# Patient Record
Sex: Female | Born: 1935 | Race: White | Hispanic: No | Marital: Married | Smoking: Never smoker
Health system: Southern US, Community
[De-identification: ages and names within clinical notes are randomized; demographics above are authoritative.]

## PROBLEM LIST (undated history)

## (undated) DIAGNOSIS — C801 Malignant (primary) neoplasm, unspecified: Secondary | ICD-10-CM

## (undated) DIAGNOSIS — I10 Essential (primary) hypertension: Secondary | ICD-10-CM

## (undated) DIAGNOSIS — A809 Acute poliomyelitis, unspecified: Secondary | ICD-10-CM

## (undated) DIAGNOSIS — R42 Dizziness and giddiness: Secondary | ICD-10-CM

## (undated) DIAGNOSIS — T7840XA Allergy, unspecified, initial encounter: Secondary | ICD-10-CM

## (undated) DIAGNOSIS — H409 Unspecified glaucoma: Secondary | ICD-10-CM

## (undated) DIAGNOSIS — K5790 Diverticulosis of intestine, part unspecified, without perforation or abscess without bleeding: Secondary | ICD-10-CM

## (undated) HISTORY — DX: Acute poliomyelitis, unspecified: A80.9

## (undated) HISTORY — PX: BREAST SURGERY: SHX581

## (undated) HISTORY — DX: Unspecified glaucoma: H40.9

## (undated) HISTORY — PX: DILATION AND CURETTAGE OF UTERUS: SHX78

## (undated) HISTORY — DX: Allergy, unspecified, initial encounter: T78.40XA

## (undated) HISTORY — DX: Dizziness and giddiness: R42

## (undated) HISTORY — PX: OTHER SURGICAL HISTORY: SHX169

## (undated) HISTORY — DX: Essential (primary) hypertension: I10

## (undated) HISTORY — DX: Malignant (primary) neoplasm, unspecified: C80.1

## (undated) HISTORY — PX: LUMBAR LAMINECTOMY: SHX95

## (undated) HISTORY — DX: Diverticulosis of intestine, part unspecified, without perforation or abscess without bleeding: K57.90

---

## 1999-04-22 ENCOUNTER — Ambulatory Visit (HOSPITAL_COMMUNITY): Admission: RE | Admit: 1999-04-22 | Discharge: 1999-04-22 | Payer: Self-pay | Admitting: *Deleted

## 1999-04-23 ENCOUNTER — Ambulatory Visit (HOSPITAL_COMMUNITY): Admission: RE | Admit: 1999-04-23 | Discharge: 1999-04-23 | Payer: Self-pay | Admitting: *Deleted

## 1999-04-23 ENCOUNTER — Encounter: Payer: Self-pay | Admitting: *Deleted

## 2002-01-30 ENCOUNTER — Ambulatory Visit (HOSPITAL_BASED_OUTPATIENT_CLINIC_OR_DEPARTMENT_OTHER): Admission: RE | Admit: 2002-01-30 | Discharge: 2002-01-30 | Payer: Self-pay | Admitting: Orthopedic Surgery

## 2002-01-30 ENCOUNTER — Encounter (INDEPENDENT_AMBULATORY_CARE_PROVIDER_SITE_OTHER): Payer: Self-pay | Admitting: *Deleted

## 2004-09-13 ENCOUNTER — Encounter: Admission: RE | Admit: 2004-09-13 | Discharge: 2004-09-13 | Payer: Self-pay | Admitting: Family Medicine

## 2004-09-13 ENCOUNTER — Ambulatory Visit: Payer: Self-pay | Admitting: Family Medicine

## 2005-04-08 ENCOUNTER — Ambulatory Visit: Payer: Self-pay | Admitting: Family Medicine

## 2005-10-05 ENCOUNTER — Ambulatory Visit: Payer: Self-pay | Admitting: Family Medicine

## 2005-10-14 ENCOUNTER — Ambulatory Visit: Payer: Self-pay | Admitting: Family Medicine

## 2006-04-27 ENCOUNTER — Ambulatory Visit: Payer: Self-pay | Admitting: Family Medicine

## 2006-10-13 DIAGNOSIS — K5730 Diverticulosis of large intestine without perforation or abscess without bleeding: Secondary | ICD-10-CM

## 2006-10-13 DIAGNOSIS — T7840XA Allergy, unspecified, initial encounter: Secondary | ICD-10-CM | POA: Insufficient documentation

## 2006-10-13 DIAGNOSIS — M810 Age-related osteoporosis without current pathological fracture: Secondary | ICD-10-CM | POA: Insufficient documentation

## 2006-10-13 DIAGNOSIS — I10 Essential (primary) hypertension: Secondary | ICD-10-CM | POA: Insufficient documentation

## 2006-10-13 DIAGNOSIS — R42 Dizziness and giddiness: Secondary | ICD-10-CM

## 2006-10-13 HISTORY — DX: Essential (primary) hypertension: I10

## 2006-10-13 HISTORY — DX: Dizziness and giddiness: R42

## 2006-10-13 HISTORY — DX: Age-related osteoporosis without current pathological fracture: M81.0

## 2006-10-13 HISTORY — DX: Diverticulosis of large intestine without perforation or abscess without bleeding: K57.30

## 2006-10-17 ENCOUNTER — Ambulatory Visit: Payer: Self-pay | Admitting: Family Medicine

## 2006-10-17 LAB — CONVERTED CEMR LAB
Alkaline Phosphatase: 37 units/L — ABNORMAL LOW (ref 39–117)
Basophils Relative: 0.1 % (ref 0.0–1.0)
CO2: 28 meq/L (ref 19–32)
Cholesterol: 141 mg/dL (ref 0–200)
Creatinine, Ser: 0.7 mg/dL (ref 0.4–1.2)
Eosinophils Relative: 1.7 % (ref 0.0–5.0)
Glucose, Bld: 101 mg/dL — ABNORMAL HIGH (ref 70–99)
HCT: 36.1 % (ref 36.0–46.0)
Hemoglobin: 12.6 g/dL (ref 12.0–15.0)
LDL Cholesterol: 63 mg/dL (ref 0–99)
Monocytes Absolute: 0.5 10*3/uL (ref 0.2–0.7)
Neutrophils Relative %: 80.9 % — ABNORMAL HIGH (ref 43.0–77.0)
Potassium: 3.7 meq/L (ref 3.5–5.1)
RDW: 13 % (ref 11.5–14.6)
Sodium: 142 meq/L (ref 135–145)
TSH: 1.27 microintl units/mL (ref 0.35–5.50)
Total Bilirubin: 0.5 mg/dL (ref 0.3–1.2)
Total Protein: 6.2 g/dL (ref 6.0–8.3)
VLDL: 13 mg/dL (ref 0–40)

## 2007-04-06 ENCOUNTER — Encounter: Payer: Self-pay | Admitting: Family Medicine

## 2007-05-03 ENCOUNTER — Ambulatory Visit: Payer: Self-pay | Admitting: Family Medicine

## 2007-09-26 LAB — CONVERTED CEMR LAB: Pap Smear: NORMAL

## 2007-10-15 ENCOUNTER — Telehealth (INDEPENDENT_AMBULATORY_CARE_PROVIDER_SITE_OTHER): Payer: Self-pay | Admitting: *Deleted

## 2007-10-24 ENCOUNTER — Encounter: Payer: Self-pay | Admitting: Family Medicine

## 2007-11-20 ENCOUNTER — Ambulatory Visit: Payer: Self-pay | Admitting: Family Medicine

## 2007-11-20 DIAGNOSIS — M4120 Other idiopathic scoliosis, site unspecified: Secondary | ICD-10-CM

## 2007-11-20 DIAGNOSIS — Z8612 Personal history of poliomyelitis: Secondary | ICD-10-CM

## 2007-11-20 HISTORY — DX: Personal history of poliomyelitis: Z86.12

## 2007-11-20 HISTORY — DX: Other idiopathic scoliosis, site unspecified: M41.20

## 2007-11-22 ENCOUNTER — Encounter (INDEPENDENT_AMBULATORY_CARE_PROVIDER_SITE_OTHER): Payer: Self-pay | Admitting: *Deleted

## 2007-11-26 ENCOUNTER — Encounter: Payer: Self-pay | Admitting: Family Medicine

## 2007-12-06 ENCOUNTER — Encounter: Payer: Self-pay | Admitting: Family Medicine

## 2007-12-13 ENCOUNTER — Encounter: Payer: Self-pay | Admitting: Family Medicine

## 2008-01-09 ENCOUNTER — Encounter: Admission: RE | Admit: 2008-01-09 | Discharge: 2008-01-09 | Payer: Self-pay | Admitting: Orthopedic Surgery

## 2008-01-22 ENCOUNTER — Encounter: Admission: RE | Admit: 2008-01-22 | Discharge: 2008-01-22 | Payer: Self-pay | Admitting: Orthopedic Surgery

## 2008-02-19 ENCOUNTER — Ambulatory Visit (HOSPITAL_COMMUNITY): Admission: RE | Admit: 2008-02-19 | Discharge: 2008-02-20 | Payer: Self-pay | Admitting: Neurosurgery

## 2008-02-19 ENCOUNTER — Encounter: Payer: Self-pay | Admitting: Family Medicine

## 2008-03-07 ENCOUNTER — Ambulatory Visit: Payer: Self-pay | Admitting: Family Medicine

## 2008-03-07 DIAGNOSIS — Z853 Personal history of malignant neoplasm of breast: Secondary | ICD-10-CM

## 2008-03-07 HISTORY — DX: Personal history of malignant neoplasm of breast: Z85.3

## 2008-03-10 LAB — CONVERTED CEMR LAB
ALT: 12 units/L (ref 0–35)
AST: 18 units/L (ref 0–37)
Albumin: 3.3 g/dL — ABNORMAL LOW (ref 3.5–5.2)
BUN: 7 mg/dL (ref 6–23)
Basophils Relative: 0.7 % (ref 0.0–3.0)
CO2: 27 meq/L (ref 19–32)
Chloride: 109 meq/L (ref 96–112)
Creatinine, Ser: 0.6 mg/dL (ref 0.4–1.2)
Eosinophils Absolute: 0 10*3/uL (ref 0.0–0.7)
Eosinophils Relative: 1.2 % (ref 0.0–5.0)
GFR calc non Af Amer: 103 mL/min
MCV: 93.1 fL (ref 78.0–100.0)
Neutrophils Relative %: 71.9 % (ref 43.0–77.0)
RBC: 3.64 M/uL — ABNORMAL LOW (ref 3.87–5.11)
TSH: 0.63 microintl units/mL (ref 0.35–5.50)
Total Protein: 6.1 g/dL (ref 6.0–8.3)
VLDL: 17 mg/dL (ref 0–40)
WBC: 3.9 10*3/uL — ABNORMAL LOW (ref 4.5–10.5)

## 2008-03-11 ENCOUNTER — Telehealth (INDEPENDENT_AMBULATORY_CARE_PROVIDER_SITE_OTHER): Payer: Self-pay | Admitting: *Deleted

## 2008-03-12 ENCOUNTER — Encounter (INDEPENDENT_AMBULATORY_CARE_PROVIDER_SITE_OTHER): Payer: Self-pay | Admitting: *Deleted

## 2008-03-19 ENCOUNTER — Encounter: Admission: RE | Admit: 2008-03-19 | Discharge: 2008-04-17 | Payer: Self-pay | Admitting: Neurosurgery

## 2008-06-11 ENCOUNTER — Ambulatory Visit: Payer: Self-pay | Admitting: Family Medicine

## 2008-06-18 ENCOUNTER — Encounter: Payer: Self-pay | Admitting: Family Medicine

## 2009-01-13 ENCOUNTER — Encounter: Payer: Self-pay | Admitting: Family Medicine

## 2009-01-22 ENCOUNTER — Encounter: Payer: Self-pay | Admitting: Family Medicine

## 2009-01-27 ENCOUNTER — Encounter: Payer: Self-pay | Admitting: Family Medicine

## 2009-03-11 ENCOUNTER — Encounter: Payer: Self-pay | Admitting: Family Medicine

## 2009-03-11 ENCOUNTER — Ambulatory Visit: Payer: Self-pay | Admitting: Family Medicine

## 2009-03-11 LAB — CONVERTED CEMR LAB: OCCULT 2: NEGATIVE

## 2009-03-17 ENCOUNTER — Telehealth: Payer: Self-pay | Admitting: Family Medicine

## 2009-07-01 ENCOUNTER — Ambulatory Visit: Payer: Self-pay | Admitting: Family Medicine

## 2009-08-25 ENCOUNTER — Encounter: Payer: Self-pay | Admitting: Family Medicine

## 2010-02-24 ENCOUNTER — Encounter: Payer: Self-pay | Admitting: Family Medicine

## 2010-03-09 ENCOUNTER — Telehealth (INDEPENDENT_AMBULATORY_CARE_PROVIDER_SITE_OTHER): Payer: Self-pay | Admitting: *Deleted

## 2010-03-16 ENCOUNTER — Encounter: Payer: Self-pay | Admitting: Family Medicine

## 2010-03-16 ENCOUNTER — Ambulatory Visit: Payer: Self-pay | Admitting: Family Medicine

## 2010-03-17 ENCOUNTER — Encounter: Payer: Self-pay | Admitting: Family Medicine

## 2010-03-18 LAB — CONVERTED CEMR LAB
ALT: 20 units/L (ref 0–35)
AST: 27 units/L (ref 0–37)
Alkaline Phosphatase: 66 units/L (ref 39–117)
BUN: 15 mg/dL (ref 6–23)
Basophils Absolute: 0 10*3/uL (ref 0.0–0.1)
Bilirubin, Direct: 0.1 mg/dL (ref 0.0–0.3)
Calcium: 8.8 mg/dL (ref 8.4–10.5)
Cholesterol: 156 mg/dL (ref 0–200)
Creatinine, Ser: 0.7 mg/dL (ref 0.4–1.2)
Eosinophils Relative: 2.4 % (ref 0.0–5.0)
GFR calc non Af Amer: 90.45 mL/min (ref >60)
Glucose, Bld: 96 mg/dL (ref 70–99)
HCT: 36.2 % (ref 36.0–46.0)
HDL: 61.2 mg/dL (ref >39.00)
LDL Cholesterol: 73 mg/dL (ref 0–99)
Lymphocytes Relative: 17.9 % (ref 12.0–46.0)
Monocytes Relative: 9.2 % (ref 3.0–12.0)
Neutrophils Relative %: 69.8 % (ref 43.0–77.0)
Platelets: 207 10*3/uL (ref 150.0–400.0)
Potassium: 4 meq/L (ref 3.5–5.1)
RDW: 14.5 % (ref 11.5–14.6)
Total Bilirubin: 0.5 mg/dL (ref 0.3–1.2)
VLDL: 21.6 mg/dL (ref 0.0–40.0)
Vit D, 25-Hydroxy: 54 ng/mL (ref 30–89)
WBC: 4.9 10*3/uL (ref 4.5–10.5)

## 2010-03-25 ENCOUNTER — Ambulatory Visit: Payer: Self-pay | Admitting: Family Medicine

## 2010-04-27 ENCOUNTER — Encounter: Payer: Self-pay | Admitting: Family Medicine

## 2010-04-28 ENCOUNTER — Encounter (INDEPENDENT_AMBULATORY_CARE_PROVIDER_SITE_OTHER): Payer: Self-pay | Admitting: *Deleted

## 2010-06-15 ENCOUNTER — Ambulatory Visit
Admission: RE | Admit: 2010-06-15 | Discharge: 2010-06-15 | Payer: Self-pay | Source: Home / Self Care | Attending: Family Medicine | Admitting: Family Medicine

## 2010-06-15 DIAGNOSIS — M549 Dorsalgia, unspecified: Secondary | ICD-10-CM

## 2010-06-15 HISTORY — DX: Dorsalgia, unspecified: M54.9

## 2010-06-22 ENCOUNTER — Telehealth (INDEPENDENT_AMBULATORY_CARE_PROVIDER_SITE_OTHER): Payer: Self-pay | Admitting: *Deleted

## 2010-07-05 ENCOUNTER — Encounter: Payer: Self-pay | Admitting: Orthopedic Surgery

## 2010-07-11 LAB — CONVERTED CEMR LAB
ALT: 16 units/L (ref 0–35)
Albumin: 3.5 g/dL (ref 3.5–5.2)
Alkaline Phosphatase: 39 units/L (ref 39–117)
Basophils Relative: 0.7 % (ref 0.0–3.0)
CO2: 30 meq/L (ref 19–32)
Chloride: 106 meq/L (ref 96–112)
Eosinophils Absolute: 0.1 10*3/uL (ref 0.0–0.7)
Eosinophils Relative: 2.6 % (ref 0.0–5.0)
Hemoglobin: 12.5 g/dL (ref 12.0–15.0)
Lymphocytes Relative: 18.8 % (ref 12.0–46.0)
MCHC: 34 g/dL (ref 30.0–36.0)
MCV: 94 fL (ref 78.0–100.0)
Monocytes Absolute: 0.3 10*3/uL (ref 0.1–1.0)
Neutro Abs: 2.9 10*3/uL (ref 1.4–7.7)
RBC: 3.9 M/uL (ref 3.87–5.11)
Sodium: 140 meq/L (ref 135–145)
Total CHOL/HDL Ratio: 2
Total Protein: 6.4 g/dL (ref 6.0–8.3)

## 2010-07-12 ENCOUNTER — Telehealth: Payer: Self-pay | Admitting: Family Medicine

## 2010-07-13 NOTE — Progress Notes (Signed)
Summary: Osteoporosis Results  Phone Note Outgoing Call   Call placed by: [name] CMA Duncan Dull),  March 09, 2010 1:08 PM Call placed to: Patient Details for Reason: Osteoporosis  Summary of Call: spk with pt, gave her Bone density results, Pt says she was having problems with the Fosamax and she stopped taking it a year ago. Wants to discuss alternate med when she comes in next week for her CPX but she knows she does not want the prolia.  Adv pt I will document chart and we will see her at her visit next week, she agreed. call ended. Initial call taken by: [name] CMA Duncan Dull),  March 09, 2010 1:08 PM

## 2010-07-13 NOTE — Medication Information (Signed)
Summary: Clarification for Ativan/[facility]  Clarification for Ativan/[facility]   Imported By: Lanelle Bal 03/30/2010 15:04:38  _____________________________________________________________________  External Attachment:    Type:   Image     Comment:   External Document

## 2010-07-13 NOTE — Letter (Signed)
Summary: [facility]: Immunoassay Fecal Occult Blood (iFOB) Order Form  [facility] [place]  [address] [place], [place] [address]   Phone: [phone]  Fax: [phone]      [facility]: Immunoassay Fecal Occult Blood (iFOB) Order Form   March 16, 2010 MRN: [id number]   Cassidy Brown Jan 01, 1930   Physicican Name: Dr.Lowne  Diagnosis Code: V56.71      [name] CMA (AAMA)

## 2010-07-13 NOTE — Letter (Signed)
Summary: [facility]  [facility]emat[facility]cology   Imported By: Lanelle Bal 09/02/2009 10:44:00  _____________________________________________________________________  External Attachment:    Type:   Image     Comment:   External Document

## 2010-07-13 NOTE — Miscellaneous (Signed)
Summary: Immunization Entry   Immunization History:  Influenza Immunization History:    Influenza:  historical @ [facility] (04/27/2010)

## 2010-07-13 NOTE — Assessment & Plan Note (Signed)
Summary: yearly//ph   Vital Signs:  Patient profile:   77 year old female Height:      61.75 inches Weight:      127.2 pounds BMI:     23.54 Temp:     98.6 degrees F oral Pulse rate:   72 / minute BP sitting:   138 / 76  (right arm) Cuff size:   regular  Vitals Entered By: [name] CMA Duncan Dull) (March 16, 2010 8:39 AM) CC: cpx/fasting, wants to discuss prolia Nutritional Status BMI of 19 -24 = normal  Does patient need assistance? Functional Status Self care, Cook/clean, Shopping, Social activities Ambulation Normal Comments Pt is able to read and write and to all ADLS.    Vision Screening:      Vision Comments: optho q4h--Mac degen---+ glasses 40db HL: Left  Right  Audiometry Comment: hearing ok--  L ear decreased hearing     History of Present Illness: Pt here for cpe ---no complaints.   Pt has ? about osteoporosis and prolia.   Pt Sees Dr Cliffton Asters for gyn and mammo.     Preventive Screening-Counseling & Management  Alcohol-Tobacco     Alcohol drinks/day: <1     Smoking Status: never     Passive Smoke Exposure: no  Caffeine-Diet-Exercise     Caffeine use/day: 0     Does Patient Exercise: yes     Type of exercise: walk     Times/week: <3  Hep-HIV-STD-Contraception     HIV Risk: no     Dental Visit-last 6 months yes     Dental Care Counseling: not indicated; dental care within six months     SBE monthly: yes     Sun Exposure-Excessive: no  Safety-Violence-Falls     Seat Belt Use: 100     Firearms in the Home: firearms in the home     Firearm Counseling: not indicated; uses recommended firearm safety measures     Smoke Detectors: yes     Smoke Detector Counseling: no     Violence in the Home: no risk noted     Violence Counseling: not indicated; no violence risk noted     Sexual Abuse: no     Sexual Abuse Counseling: no     Fall Risk: no      Sexual History:  currently monogamous.    Current Medications (verified): 1)  Atenolol 50 Mg  Tabs  (Atenolol) .Marland Kitchen.. 1 1/2 By Mouth Qd 2)  Terazosin Hcl 5 Mg  Caps (Terazosin Hcl) .Marland Kitchen.. 1 By Mouth Qd 3)  Claritin 10 Mg  Caps (Loratadine) .... Daily As Needed 4)  Sudafed 30 Mg  Tabs (Pseudoephedrine Hcl) .... As Needed 5)  Antivert 25 Mg  Tabs (Meclizine Hcl) .Marland Kitchen.. 1 By Mouth Three Times A Day As Needed 6)  Ativan 1 Mg Tabs (Lorazepam) .... Take 1 To 1/2 Tab As Needed 7)  Vitamin D 1000 Unit Tabs (Cholecalciferol) .... Take 1 Tab Once Daily 8)  Zostavax 16109 Unt/0.81ml Solr (Zoster Vaccine Live) .Marland Kitchen.. 1 Ml  Im X1  Allergies (verified): 1)  ! Darvocet 2)  ! Hydrocodone  Past History:  Past Medical History: Last updated: 03/07/2008 Diverticulosis, colon Hypertension Osteoporosis Dizziness or vertigo Current Problems:  POLIOMYELITIS, HX OF (ICD-V12.02) SCOLIOSIS (ICD-737.30) DIZZINESS OR VERTIGO (ICD-780.4) OSTEOPOROSIS (ICD-733.00) HYPERTENSION (ICD-401.9) DIVERTICULOSIS, COLON (ICD-562.10) Breast cancer, hx of  Family History: Last updated: 03/07/2008 Family History Hypertension P uncles---- cad  Social History: Last updated: 03/16/2010 Retired--pilot life Married Never Smoked Alcohol  use-no Drug use-n0 Regular exercise-yes  Risk Factors: Alcohol Use: <1 (03/16/2010) Caffeine Use: 0 (03/16/2010) Exercise: yes (03/16/2010)  Risk Factors: Smoking Status: never (03/16/2010) Passive Smoke Exposure: no (03/16/2010)  Past Surgical History: Lumpectomy Lumbar laminectomy 2009 d&C x 3 ganglion cyst r wrist US guided core biopsy L Breast--normal  Family History: Reviewed history from 03/07/2008 and no changes required. Family History Hypertension P uncles---- cad  Social History: Reviewed history from 03/07/2008 and no changes required. Retired--pilot life Married Never Smoked Alcohol use-no Drug use-n0 Regular exercise-yes Fall Risk:  no  Review of Systems      See HPI General:  Denies chills, fatigue, fever, loss of appetite, malaise, sleep disorder,  sweats, weakness, and weight loss. Eyes:  Complains of blurring; denies discharge, double vision, eye irritation, eye pain, halos, itching, light sensitivity, red eye, vision loss-1 eye, and vision loss-both eyes. ENT:  Complains of decreased hearing; denies difficulty swallowing, ear discharge, earache, hoarseness, nasal congestion, nosebleeds, postnasal drainage, ringing in ears, sinus pressure, and sore throat. CV:  Denies bluish discoloration of lips or nails, chest pain or discomfort, difficulty breathing at night, difficulty breathing while lying down, fainting, fatigue, leg cramps with exertion, lightheadness, near fainting, palpitations, shortness of breath with exertion, swelling of feet, swelling of hands, and weight gain. Resp:  Denies chest discomfort, chest pain with inspiration, cough, coughing up blood, excessive snoring, hypersomnolence, morning headaches, pleuritic, shortness of breath, sputum productive, and wheezing. GI:  Denies abdominal pain, bloody stools, change in bowel habits, constipation, dark tarry stools, diarrhea, excessive appetite, gas, hemorrhoids, indigestion, loss of appetite, nausea, vomiting, vomiting blood, and yellowish skin color. GU:  Denies abnormal vaginal bleeding, decreased libido, discharge, dysuria, genital sores, hematuria, incontinence, nocturia, urinary frequency, and urinary hesitancy. MS:  Complains of low back pain; denies joint pain, joint redness, joint swelling, loss of strength, mid back pain, muscle aches, muscle , cramps, muscle weakness, stiffness, and thoracic pain. Derm:  Denies changes in color of skin, changes in nail beds, dryness, excessive perspiration, flushing, hair loss, insect bite(s), itching, lesion(s), poor wound healing, and rash. Neuro:  Denies brief paralysis, difficulty with concentration, disturbances in coordination, falling down, headaches, inability to speak, memory loss, numbness, poor balance, seizures, sensation of room  spinning, tingling, tremors, visual disturbances, and weakness. Psych:  Denies alternate hallucination ( auditory/visual), anxiety, depression, easily angered, easily tearful, irritability, mental problems, panic attacks, sense of great danger, suicidal thoughts/plans, thoughts of violence, unusual visions or sounds, and thoughts /plans of harming others. Endo:  Denies cold intolerance, excessive hunger, excessive thirst, excessive urination, heat intolerance, polyuria, and weight change. Heme:  Denies abnormal bruising, bleeding, enlarge lymph nodes, fevers, pallor, and skin discoloration. Allergy:  Denies hives or rash, itching eyes, persistent infections, seasonal allergies, and sneezing.  Physical Exam  General:  Well-developed,well-nourished,in no acute distress; alert,appropriate and cooperative throughout examination Head:  Normocephalic and atraumatic without obvious abnormalities. No apparent alopecia or balding. Eyes:  pupils equal, pupils round, and pupils reactive to light.   Ears:  External ear exam shows no significant lesions or deformities.  Otoscopic examination reveals clear canals, tympanic membranes are intact bilaterally without bulging, retraction, inflammation or discharge.  Nose:  External nasal examination shows no deformity or inflammation. Nasal mucosa are pink and moist without lesions or exudates. Mouth:  Oral mucosa and oropharynx without lesions or exudates.  Teeth in good repair. Neck:  No deformities, masses, or tenderness noted. Chest Wall:  No deformities, masses, or tenderness noted. Breasts:  gyn Lungs:  Normal respiratory effort, chest expands symmetrically. Lungs are clear to auscultation, no crackles or wheezes. Heart:  normal rate and no murmur.   Abdomen:  Bowel sounds positive,abdomen soft and non-tender without masses, organomegaly or hernias noted. Rectal:  gyn Genitalia:  gyn Msk:  normal ROM, no joint tenderness, no joint swelling, no joint warmth,  no redness over joints, no joint deformities, no joint instability, and no crepitation.   Pulses:  R posterior tibial normal, R dorsalis pedis normal, R carotid normal, L posterior tibial normal, L dorsalis pedis normal, and L carotid normal.   Extremities:  No clubbing, cyanosis, edema, or deformity noted with normal full range of motion of all joints.   Neurologic:  No cranial nerve deficits noted. Station and gait are normal. Plantar reflexes are down-going bilaterally. DTRs are symmetrical throughout. Sensory, motor and coordinative functions appear intact. Skin:  Intact without suspicious lesions or rashes Cervical Nodes:  No lymphadenopathy noted Axillary Nodes:  No palpable lymphadenopathy Psych:  Cognition and judgment appear intact. Alert and cooperative with normal attention span and concentration. No apparent delusions, illusions, hallucinations   Impression & Recommendations:  Problem # 1:  PREVENTIVE HEALTH CARE (ICD-V70.0) ghm utd  Orders: Venipuncture (16109) TLB-Lipid Panel (80061-LIPID) TLB-BMP (Basic Metabolic Panel-BMET) (80048-METABOL) TLB-CBC Platelet - w/Differential (85025-CBCD) TLB-Hepatic/Liver Function Pnl (80076-HEPATIC) T-Vitamin D (25-Hydroxy) (60454-09811) Specimen Handling (91478) Medicare -1st Annual Wellness Visit [phone]) EKG w/ Interpretation (93000)  Problem # 2:  BREAST CANCER, HX OF (ICD-V10.3)  Problem # 3:  OSTEOPOROSIS (ICD-733.00)  The following medications were removed from the medication list:    Fosamax 70 Mg Tabs (Alendronate sodium) ..... Once weekly    Vitamin D (ergocalciferol) 50000 Unit Caps (Ergocalciferol) .Marland Kitchen... 1 by mouth once weekly Her updated medication list for this problem includes:    Vitamin D 1000 Unit Tabs (Cholecalciferol) .Marland Kitchen... Take 1 tab once daily  Orders: Venipuncture (13086) TLB-Lipid Panel (80061-LIPID) TLB-BMP (Basic Metabolic Panel-BMET) (80048-METABOL) TLB-CBC Platelet - w/Differential  (85025-CBCD) TLB-Hepatic/Liver Function Pnl (80076-HEPATIC) T-Vitamin D (25-Hydroxy) (57846-96295) Specimen Handling (28413)  Vit D:45 (07/01/2009), 26 (03/11/2009)  Problem # 4:  HYPERTENSION (ICD-401.9)  Her updated medication list for this problem includes:    Atenolol 50 Mg Tabs (Atenolol) .Marland Kitchen... 1 1/2 by mouth qd    Terazosin Hcl 5 Mg Caps (Terazosin hcl) .Marland Kitchen... 1 by mouth qd  Orders: Venipuncture (24401) TLB-Lipid Panel (80061-LIPID) TLB-BMP (Basic Metabolic Panel-BMET) (80048-METABOL) TLB-CBC Platelet - w/Differential (85025-CBCD) TLB-Hepatic/Liver Function Pnl (80076-HEPATIC) T-Vitamin D (25-Hydroxy) (02725-36644) Specimen Handling (03474)  BP today: 138/76 Prior BP: 136/72 (03/11/2009)  Labs Reviewed: K+: 3.7 (03/11/2009) Creat: : 0.6 (03/11/2009)   Chol: 130 (03/11/2009)   HDL: 60.80 (03/11/2009)   LDL: 53 (03/11/2009)   TG: 79.0 (03/11/2009)  Complete Medication List: 1)  Atenolol 50 Mg Tabs (Atenolol) .Marland Kitchen.. 1 1/2 by mouth qd 2)  Terazosin Hcl 5 Mg Caps (Terazosin hcl) .Marland Kitchen.. 1 by mouth qd 3)  Claritin 10 Mg Caps (Loratadine) .... Daily as needed 4)  Sudafed 30 Mg Tabs (Pseudoephedrine hcl) .... As needed 5)  Antivert 25 Mg Tabs (Meclizine hcl) .Marland Kitchen.. 1 by mouth three times a day as needed 6)  Ativan 1 Mg Tabs (Lorazepam) .... Take 1 to 1/2 tab as needed 7)  Vitamin D 1000 Unit Tabs (Cholecalciferol) .... Take 1 tab once daily 8)  Zostavax 25956 Unt/0.74ml Solr (Zoster vaccine live) .Marland Kitchen.. 1 ml  im x1  Other Orders: Flu Vaccine 32yrs + MEDICARE PATIENTS (L8756) Administration Flu vaccine - MCR (E3329) Flu Vaccine  Consent Questions     Do you have a history of severe allergic reactions to this vaccine? no    Any prior history of allergic reactions to egg and/or gelatin? no    Do you have a sensitivity to the preservative Thimersol? no    Do you have a past history of Guillan-Barre Syndrome? no    Do you currently have an acute febrile illness? no    Have you ever had a  severe reaction to latex? no    Vaccine information given and explained to patient? yes    Are you currently pregnant? no    Lot Number:[id number]   Exp Date:12/11/2010   Site Given  Left Deltoid IM Other Orders: Flu Vaccine 78yrs + MEDICARE PATIENTS (Z6109) Administration Flu vaccine - MCR (U0454)  Patient Instructions: 1)  Please schedule a follow-up appointment in 1 year.  Prescriptions: ATIVAN 1 MG TABS (LORAZEPAM) take 1 to 1/2 tab as needed  #90 x 0   Entered and Authorized by:   Loreen Freud DO   Signed by:   Loreen Freud DO on 03/16/2010   Method used:   Printed then faxed to ...       [facility]             , [place]         Ph:        Fax: [phone]   RxID:   [id number] TERAZOSIN HCL 5 MG  CAPS (TERAZOSIN HCL) 1 by mouth qd  #90 x 3   Entered and Authorized by:   Loreen Freud DO   Signed by:   Loreen Freud DO on 03/16/2010   Method used:   Faxed to ...       [facility] ([facility]order)             , [place]         Ph:        Fax: [phone]   RxID:   [id number] ATENOLOL 50 MG  TABS (ATENOLOL) 1 1/2 by mouth qd  #135 x 3   Entered and Authorized by:   Loreen Freud DO   Signed by:   Loreen Freud DO on 03/16/2010   Method used:   Faxed to ...       [facility] (mail[facility]order)             , [place]         Ph:        Fax: [phone]   RxID:   [id number] ZOSTAVAX 06301 UNT/0.65ML SOLR (ZOSTER VACCINE LIVE) 1 ml  IM x1  #1 x 0   Entered and Authorized by:   Loreen Freud DO   Signed by:   Loreen Freud DO on 03/16/2010   Method used:   Print then Give to Patient   RxID:   [id number]  .lbmedflu

## 2010-07-13 NOTE — Miscellaneous (Signed)
Summary: [facility]  [facility]   Imported By: Lanelle Bal 05/07/2010 12:05:28  _____________________________________________________________________  External Attachment:    Type:   Image     Comment:   External Document

## 2010-07-15 NOTE — Op Note (Signed)
Summary: Back Surgery/[facility]  [facility]   Imported By: Lanelle Bal 06/21/2010 08:52:49  _____________________________________________________________________  External Attachment:    Type:   Image     Comment:   External Document

## 2010-07-15 NOTE — Progress Notes (Signed)
Summary: RCV'D denial letter from [facility]   Phone Note Outgoing Call   Call placed by: [name] CMA Duncan Dull),  June 22, 2010 1:47 PM Call placed to: Patient Details for Reason: RCV'D denial letter from [facility] Summary of Call: RCV'D denial letter from [facility]. they denied the MRI of the lower back. Per Dr.Lowne since MRI was denied we will need to wait to see what the neurosurgeon says.... [name] CMA Duncan Dull)  June 22, 2010 1:50 PM   Follow-up for Phone Call        Patient is aware.[name]  June 22, 2010 2:24 PM  Additional Follow-up for Phone Call Additional follow up Details #1::        document from Ins company sent to be scanned... Additional Follow-up by: [name] CMA Duncan Dull),  June 22, 2010 3:41 PM

## 2010-07-15 NOTE — Assessment & Plan Note (Signed)
Summary: lower back pain/cbs   Vital Signs:  Patient profile:   77 year old female Weight:      126.0 pounds Temp:     97.9 degrees F oral Pulse rate:   72 / minute Pulse rhythm:   regular BP sitting:   130 / 90  (right arm) Cuff size:   regular  Vitals Entered By: [name] CMA Duncan Dull) (June 15, 2010 10:54 AM) CC: c/o LBP denies urinary symptoms--stated she had been advised by Dr.Poole she needs another surgery but patient would like a second opinion   History of Present Illness: Pt here to discuss back surgery.  Pt was told she needed more surgery by Dr Dutch Quint but she would like a second opinion.  Pt had MRI 2 years ago.  Pt c/o increasing pain R side low back and pain below both knees.  No numbness and tingling---no new injury. Pt does c/o weakness---she needs to use can and has to hold on to someone to walk long distances or steps.       Current Medications (verified): 1)  Atenolol 50 Mg  Tabs (Atenolol) .Marland Kitchen.. 1 1/2 By Mouth Qd 2)  Terazosin Hcl 5 Mg  Caps (Terazosin Hcl) .Marland Kitchen.. 1 By Mouth Qd 3)  Claritin 10 Mg  Caps (Loratadine) .... Daily As Needed 4)  Sudafed 30 Mg  Tabs (Pseudoephedrine Hcl) .... As Needed 5)  Antivert 25 Mg  Tabs (Meclizine Hcl) .Marland Kitchen.. 1 By Mouth Three Times A Day As Needed 6)  Ativan 1 Mg Tabs (Lorazepam) .... 1/2 -1 Tab By Mouth Three Times A Day As Needed 7)  Vitamin D 1000 Unit Tabs (Cholecalciferol) .... Take 1 Tab Once Daily 8)  Zostavax 16109 Unt/0.41ml Solr (Zoster Vaccine Live) .Marland Kitchen.. 1 Ml  Im X1  Allergies (verified): 1)  ! Darvocet 2)  ! Hydrocodone  Past History:  Past Medical History: Last updated: 03/07/2008 Diverticulosis, colon Hypertension Osteoporosis Dizziness or vertigo Current Problems:  POLIOMYELITIS, HX OF (ICD-V12.02) SCOLIOSIS (ICD-737.30) DIZZINESS OR VERTIGO (ICD-780.4) OSTEOPOROSIS (ICD-733.00) HYPERTENSION (ICD-401.9) DIVERTICULOSIS, COLON (ICD-562.10) Breast cancer, hx of  Past Surgical History: Last updated:  03/16/2010 Lumpectomy Lumbar laminectomy 2009 d&C x 3 ganglion cyst r wrist US guided core biopsy L Breast--normal  Family History: Last updated: 03/07/2008 Family History Hypertension P uncles---- cad  Social History: Last updated: 03/16/2010 Retired--pilot life Married Never Smoked Alcohol use-no Drug use-n0 Regular exercise-yes  Risk Factors: Alcohol Use: <1 (03/16/2010) Caffeine Use: 0 (03/16/2010) Exercise: yes (03/16/2010)  Risk Factors: Smoking Status: never (03/16/2010) Passive Smoke Exposure: no (03/16/2010)  Family History: Reviewed history from 03/07/2008 and no changes required. Family History Hypertension P uncles---- cad  Social History: Reviewed history from 03/16/2010 and no changes required. Retired--pilot life Married Never Smoked Alcohol use-no Drug use-n0 Regular exercise-yes  Review of Systems      See HPI  Physical Exam  General:  Well-developed,well-nourished,in no acute distress; alert,appropriate and cooperative throughout examination Msk:  normal ROM, no joint swelling, no joint warmth, no redness over joints, and no joint deformities.  normal ROM, no joint swelling, no joint warmth, no redness over joints, and no joint deformities.   Neurologic:  strength normal in all extremities, gait normal, and DTRs symmetrical and normal.  strength normal in all extremities, gait normal, and DTRs symmetrical and normal.   Psych:  Oriented X3 and normally interactive.  Oriented X3 and normally interactive.     Impression & Recommendations:  Problem # 1:  BACK PAIN, CHRONIC (ICD-724.5)  Orders:  Neurosurgeon Referral (Neurosurgeon) Radiology Referral (Radiology)  Discussed use of moist heat or ice, modified activities, medications, and stretching/strengthening exercises. Back care instructions given. To be seen in 2 weeks if no improvement; sooner if worsening of symptoms.   pt refused pain meds  Complete Medication List: 1)  Atenolol 50  Mg Tabs (Atenolol) .Marland Kitchen.. 1 1/2 by mouth qd 2)  Terazosin Hcl 5 Mg Caps (Terazosin hcl) .Marland Kitchen.. 1 by mouth qd 3)  Claritin 10 Mg Caps (Loratadine) .... Daily as needed 4)  Sudafed 30 Mg Tabs (Pseudoephedrine hcl) .... As needed 5)  Antivert 25 Mg Tabs (Meclizine hcl) .Marland Kitchen.. 1 by mouth three times a day as needed 6)  Ativan 1 Mg Tabs (Lorazepam) .... 1/2 -1 tab by mouth three times a day as needed 7)  Vitamin D 1000 Unit Tabs (Cholecalciferol) .... Take 1 tab once daily 8)  Zostavax 16109 Unt/0.60ml Solr (Zoster vaccine live) .Marland Kitchen.. 1 ml  im x1   Orders Added: 1)  Neurosurgeon Referral [Neurosurgeon] 2)  Radiology Referral [Radiology] 3)  Est. Patient Level III [60454]

## 2010-07-26 ENCOUNTER — Telehealth: Payer: Self-pay | Admitting: Family Medicine

## 2010-07-29 NOTE — Progress Notes (Signed)
Summary: insurance for MRI  Phone Note Call from Patient Call back at Home Phone [phone]   Caller: Patient Summary of Call: Patient called back to talk about insurance for her MRi---can be reached at [phone] Initial call taken by: Jerolyn Shin,  July 12, 2010 1:44 PM  Follow-up for Phone Call        Have rec'd more paperwork, am trying to get an approval again by faxing the additional paperwork.  I s/w patient she is aware of the process going on Magdalen Spatz [facility]  July 16, 2010 5:04 PM

## 2010-08-04 NOTE — Letter (Signed)
Summary: Records from [facility] 2009 - 2010  Records from [facility] 2009 - 2010   Imported By: Maryln Gottron 07/26/2010 10:33:05  _____________________________________________________________________  External Attachment:    Type:   Image     Comment:   External Document

## 2010-08-04 NOTE — Progress Notes (Signed)
Summary: NEUROSURG REFERRAL  Phone Note Outgoing Call   Call placed by: Magdalen Spatz [facility],  July 26, 2010 10:04 AM Call placed to: Patient Summary of Call: IN REFERENCE TO 2ND OPINION NEUROSURG REFERRAL......Marland KitchenAS OF TODAY, STILL APPEALING MRI REQUEST WITH PATIENT'S INSURANCE CO.  I REC'D ADDITIONAL NOTES FROM DR. POOL'S OFFICE TO FAX TO APPEALS DEPT.  I HAVE ALSO CONTACTED [facility]'S OFFICE & ASKED FOR EXCEPTION FOR PT TO BEEN SEEN DUE TO DIFFICULTY W/INSUR COMPANY & PER APRIL & TERRI, DUE TO NO NEW MRI, NO EXCEPTION CAN BE MADE.  PATIENT IS AWARE OF ALL ABOVE, BUT STATES THAT NOW SHE CAN HARDLY WALK WITHOUT A CANE, AND THE PAIN HAS BECOME MUCH WORSE.  I AM HOPING FOR A POSITIVE OUTCOME THRU CURRENT APPEAL, BUT PLEASE ADVISE. Initial call taken by: Magdalen Spatz [facility],  July 26, 2010 10:04 AM  Follow-up for Phone Call        Ins co needs to know she can now not walk---other option is to see ortho surgeon to start and if surgery is needed she can get second opinion with neurosurgury.   Follow-up by: Loreen Freud DO,  July 26, 2010 11:11 AM  Additional Follow-up for Phone Call Additional follow up Details #1::        One of the reason's for previous denial, is lack of recent minimum 4 week treatment with medication to relax muscles, physical therapy, etc..Marland KitchenPatient is aware of this & is asking for the 4week treatment please.  I will advise insurance co. of inabaility to walk now. Magdalen Spatz [facility]  July 26, 2010 11:26 AM     Additional Follow-up for Phone Call Additional follow up Details #2::    flexeril 10 mg 1 by mouth three times a day as needed  #30  1 refill Pt is allergic to other pain meds ov if flexeril doesn't do anything Follow-up by: Loreen Freud DO,  July 26, 2010 11:44 AM  Additional Follow-up for Phone Call Additional follow up Details #3:: Details for Additional Follow-up Action Taken: pt aware of the above and will f/u if no  relief..... Additional Follow-up by: [name] CMA [facility]),  July 26, 2010 4:15 PM  New/Updated Medications: FLEXERIL 10 MG TABS (CYCLOBENZAPRINE HCL) 1 by mouth three times a day as needed Prescriptions: FLEXERIL 10 MG TABS (CYCLOBENZAPRINE HCL) 1 by mouth three times a day as needed  #30 x 1   Entered by:   [name] CMA (AAMA)   Authorized by:   Loreen Freud DO   Signed by:   [name] CMA (AAMA) on 07/26/2010   Method used:   Faxed to ...       [facility] [address] (retail)       [address] [place], [place]  [address]       Ph: [phone]       Fax: [phone]   RxID:   [id number]

## 2010-08-19 ENCOUNTER — Other Ambulatory Visit: Payer: Self-pay | Admitting: Family Medicine

## 2010-08-19 ENCOUNTER — Encounter: Payer: Self-pay | Admitting: Family Medicine

## 2010-08-19 ENCOUNTER — Ambulatory Visit (INDEPENDENT_AMBULATORY_CARE_PROVIDER_SITE_OTHER): Payer: Medicare Other | Admitting: Family Medicine

## 2010-08-19 ENCOUNTER — Ambulatory Visit (HOSPITAL_BASED_OUTPATIENT_CLINIC_OR_DEPARTMENT_OTHER)
Admission: RE | Admit: 2010-08-19 | Discharge: 2010-08-19 | Disposition: A | Discharge status: Home / Self Care | Payer: Medicare Other | Source: Ambulatory Visit | Attending: Family Medicine | Admitting: Family Medicine

## 2010-08-19 DIAGNOSIS — I10 Essential (primary) hypertension: Secondary | ICD-10-CM | POA: Insufficient documentation

## 2010-08-19 DIAGNOSIS — M549 Dorsalgia, unspecified: Secondary | ICD-10-CM

## 2010-08-19 DIAGNOSIS — Q762 Congenital spondylolisthesis: Secondary | ICD-10-CM | POA: Insufficient documentation

## 2010-08-19 DIAGNOSIS — Z853 Personal history of malignant neoplasm of breast: Secondary | ICD-10-CM | POA: Insufficient documentation

## 2010-08-24 NOTE — Assessment & Plan Note (Signed)
Summary: FINISHED MUSCLE RELAXER MEDS ON MONDAY///SPH   Vital Signs:  Patient profile:   77 year old female Weight:      127.6 pounds Pulse rate:   72 / minute Pulse rhythm:   regular BP sitting:   118 / 80  (right arm) Cuff size:   regular  Vitals Entered By: [name] CMA [facility] Dull) (August 19, 2010 2:45 PM) CC: completed meds- still having back pain and difficulty walking   History of Present Illness: Pt here still c/o low back pain.  Pt now can hardly walk without assistance.  She is doing some exercise that PT had previously given her but it is not helping and pain is increasing.  INS Co refused MRI because she had not tried muscle relaxer or PT.   Flexeril has not been helping at all.     Problems Prior to Update: 1)  Back Pain, Chronic  (ICD-724.5) 2)  Preventive Health Care  (ICD-V70.0) 3)  Breast Cancer, Hx of  (ICD-V10.3) 4)  Poliomyelitis, Hx of  (ICD-V12.02) 5)  Scoliosis  (ICD-737.30) 6)  Dizziness or Vertigo  (ICD-780.4) 7)  Osteoporosis  (ICD-733.00) 8)  Hypertension  (ICD-401.9) 9)  Diverticulosis, Colon  (ICD-562.10)  Medications Prior to Update: 1)  Atenolol 50 Mg  Tabs (Atenolol) .Marland Kitchen.. 1 1/2 By Mouth Qd 2)  Terazosin Hcl 5 Mg  Caps (Terazosin Hcl) .Marland Kitchen.. 1 By Mouth Qd 3)  Claritin 10 Mg  Caps (Loratadine) .... Daily As Needed 4)  Sudafed 30 Mg  Tabs (Pseudoephedrine Hcl) .... As Needed 5)  Antivert 25 Mg  Tabs (Meclizine Hcl) .Marland Kitchen.. 1 By Mouth Three Times A Day As Needed 6)  Ativan 1 Mg Tabs (Lorazepam) .... 1/2 -1 Tab By Mouth Three Times A Day As Needed 7)  Vitamin D 1000 Unit Tabs (Cholecalciferol) .... Take 1 Tab Once Daily 8)  Zostavax 57846 Unt/0.51ml Solr (Zoster Vaccine Live) .Marland Kitchen.. 1 Ml  Im X1 9)  Flexeril 10 Mg Tabs (Cyclobenzaprine Hcl) .Marland Kitchen.. 1 By Mouth Three Times A Day As Needed  Current Medications (verified): 1)  Atenolol 50 Mg  Tabs (Atenolol) .Marland Kitchen.. 1 1/2 By Mouth Qd 2)  Terazosin Hcl 5 Mg  Caps (Terazosin Hcl) .Marland Kitchen.. 1 By Mouth Qd 3)  Claritin 10  Mg  Caps (Loratadine) .... Daily As Needed 4)  Sudafed 30 Mg  Tabs (Pseudoephedrine Hcl) .... As Needed 5)  Antivert 25 Mg  Tabs (Meclizine Hcl) .Marland Kitchen.. 1 By Mouth Three Times A Day As Needed 6)  Ativan 1 Mg Tabs (Lorazepam) .... 1/2 -1 Tab By Mouth Three Times A Day As Needed 7)  Vitamin D 1000 Unit Tabs (Cholecalciferol) .... Take 1 Tab Once Daily 8)  Zostavax 96295 Unt/0.16ml Solr (Zoster Vaccine Live) .Marland Kitchen.. 1 Ml  Im X1 9)  Flexeril 10 Mg Tabs (Cyclobenzaprine Hcl) .Marland Kitchen.. 1 By Mouth Three Times A Day As Needed  Allergies (verified): 1)  ! Darvocet 2)  ! Hydrocodone  Past History:  Past medical, surgical, family and social histories (including risk factors) reviewed for relevance to current acute and chronic problems.  Past Medical History: Reviewed history from 03/07/2008 and no changes required. Diverticulosis, colon Hypertension Osteoporosis Dizziness or vertigo Current Problems:  POLIOMYELITIS, HX OF (ICD-V12.02) SCOLIOSIS (ICD-737.30) DIZZINESS OR VERTIGO (ICD-780.4) OSTEOPOROSIS (ICD-733.00) HYPERTENSION (ICD-401.9) DIVERTICULOSIS, COLON (ICD-562.10) Breast cancer, hx of  Past Surgical History: Reviewed history from 03/16/2010 and no changes required. Lumpectomy Lumbar laminectomy 2009 d&C x 3 ganglion cyst r wrist US guided core biopsy L  Breast--normal  Family History: Reviewed history from 03/07/2008 and no changes required. Family History Hypertension P uncles---- cad  Social History: Reviewed history from 03/16/2010 and no changes required. Retired--pilot life Married Never Smoked Alcohol use-no Drug use-n0 Regular exercise-yes  Review of Systems      See HPI  Physical Exam  General:  Well-developed,well-nourished,in no acute distress; alert,appropriate and cooperative throughout examination Msk:  normal ROM and no joint swelling.   Neurologic:  strength normal in all extremities.  gait -walking with cane  DTR dec on R   Psych:  Oriented X3 and  normally interactive.     Impression & Recommendations:  Problem # 1:  BACK PAIN, CHRONIC (ICD-724.5)  Her updated medication list for this problem includes:    Flexeril 10 Mg Tabs (Cyclobenzaprine hcl) .Marland Kitchen... 1 by mouth three times a day as needed  Orders: Radiology Referral (Radiology) T-Thoracic Spine 2 Views [phone]) T-Lumbar Spine 2 Views (72100TC)  Discussed use of moist heat or ice, modified activities, medications, and stretching/strengthening exercises. Back care instructions given. To be seen in 2 weeks if no improvement; sooner if worsening of symptoms.   Complete Medication List: 1)  Atenolol 50 Mg Tabs (Atenolol) .Marland Kitchen.. 1 1/2 by mouth qd 2)  Terazosin Hcl 5 Mg Caps (Terazosin hcl) .Marland Kitchen.. 1 by mouth qd 3)  Claritin 10 Mg Caps (Loratadine) .... Daily as needed 4)  Sudafed 30 Mg Tabs (Pseudoephedrine hcl) .... As needed 5)  Antivert 25 Mg Tabs (Meclizine hcl) .Marland Kitchen.. 1 by mouth three times a day as needed 6)  Ativan 1 Mg Tabs (Lorazepam) .... 1/2 -1 tab by mouth three times a day as needed 7)  Vitamin D 1000 Unit Tabs (Cholecalciferol) .... Take 1 tab once daily 8)  Zostavax 45409 Unt/0.15ml Solr (Zoster vaccine live) .Marland Kitchen.. 1 ml  im x1 9)  Flexeril 10 Mg Tabs (Cyclobenzaprine hcl) .Marland Kitchen.. 1 by mouth three times a day as needed   Orders Added: 1)  Radiology Referral [Radiology] 2)  T-Thoracic Spine 2 Views [72070TC] 3)  T-Lumbar Spine 2 Views [72100TC] 4)  Est. Patient Level III [81191]

## 2010-09-03 ENCOUNTER — Other Ambulatory Visit (INDEPENDENT_AMBULATORY_CARE_PROVIDER_SITE_OTHER): Payer: Medicare Other

## 2010-09-03 ENCOUNTER — Other Ambulatory Visit: Payer: Self-pay | Admitting: Family Medicine

## 2010-09-03 DIAGNOSIS — G8929 Other chronic pain: Secondary | ICD-10-CM

## 2010-09-03 DIAGNOSIS — M549 Dorsalgia, unspecified: Secondary | ICD-10-CM

## 2010-09-03 DIAGNOSIS — Z01818 Encounter for other preprocedural examination: Secondary | ICD-10-CM

## 2010-09-03 LAB — CREATININE, SERUM: Creatinine, Ser: 0.7 mg/dL (ref 0.4–1.2)

## 2010-09-04 ENCOUNTER — Ambulatory Visit (HOSPITAL_BASED_OUTPATIENT_CLINIC_OR_DEPARTMENT_OTHER)
Admission: RE | Admit: 2010-09-04 | Discharge: 2010-09-04 | Disposition: A | Discharge status: Home / Self Care | Payer: Medicare Other | Source: Ambulatory Visit | Attending: Family Medicine | Admitting: Family Medicine

## 2010-09-04 DIAGNOSIS — Q762 Congenital spondylolisthesis: Secondary | ICD-10-CM | POA: Insufficient documentation

## 2010-09-04 DIAGNOSIS — Z853 Personal history of malignant neoplasm of breast: Secondary | ICD-10-CM | POA: Insufficient documentation

## 2010-09-04 DIAGNOSIS — M549 Dorsalgia, unspecified: Secondary | ICD-10-CM

## 2010-09-04 DIAGNOSIS — G8929 Other chronic pain: Secondary | ICD-10-CM

## 2010-09-04 MED ORDER — GADOBENATE DIMEGLUMINE 529 MG/ML IV SOLN: 11.0000 mL | Freq: Once | INTRAVENOUS | Status: AC | PRN

## 2010-09-06 ENCOUNTER — Encounter: Payer: Self-pay | Admitting: *Deleted

## 2010-09-06 NOTE — Progress Notes (Signed)
Printed and faxed to Neuro by Luster Landsberg, Pt aware of the results and awaiting appt    KP

## 2010-10-26 NOTE — Op Note (Signed)
NAMESHARONANN, MALBROUGH                 ACCOUNT NO.:  [id number]   MEDICAL RECORD NO.:  [id number]          PATIENT TYPE:  OIB   LOCATION:  3528                         FACILITY:  [facility]   PHYSICIAN:  Henry A. Pool, M.D.    DATE OF BIRTH:  04-12-1930   DATE OF PROCEDURE:  02/19/2008  DATE OF DISCHARGE:                               OPERATIVE REPORT   PREOPERATIVE DIAGNOSIS:  Right L4-5 stenosis and right L4-5 herniated  nucleus pulposus with radiculopathy.   POSTOPERATIVE DIAGNOSIS:  Right L4-5 stenosis and right L4-5 herniated  nucleus pulposus with radiculopathy.   PROCEDURE NAME:  Right L4-5 decompressive laminotomy and foraminotomy.  Right L4-5 microdiskectomy.   OPERATING SURGEON:  Kathaleen Maser. Pool, MD   ASSISTANT:  Reinaldo Meeker, MD   ANESTHESIA:  General endotracheal.   INDICATIONS:  Ms. Spadoni is a 77 year old female with history of back  and right lower extremity pain, paresthesias, and weakness consistent  with a right-sided L5 radiculopathy.  Workup demonstrates evidence of  right-sided L4-5 stenosis secondary to facet arthropathy, a synovial  cyst, and complicated by a rightward L4-5 disk herniation, all causing  compression of the right-sided L5 nerve root.  Further complicating  matters was the fact that there was presence of a chronic L5-S1 lytic  spondylolisthesis with some foraminal stenosis.  We discussed the  options of her management with operative and nonoperative care.  We  decided to try simple decompression in hopes of improving her symptoms.  We discussed the risks, benefits, and surgery and the patient wished  proceed.   OPERATIVE NOTE:  The patient was brought to the operating room, placed  on operating table in supine position.  After an adequate level of  anesthesia was achieved, the patient was placed prone on to [place]  frame.  Appropriately padded the patient's lumbar region, prepped and  draped sterilely.  A 10 blade was used to make a curvilinear  skin  incision, overlying the L4-5 interspace.  This was carried down sharply  in midline.  Subperiosteal dissection was then performed exposing the  lamina and facet joints of L4 and L5 on the right side.  Deep self-  retaining retractor was placed.  Intraoperative x-ray was taken and  level was confirmed.  A laminotomy was then performed using high-speed  drill and Kerrison rongeurs to remove the inferior aspect of the lamina  of L4, medial aspect of the L5-5 facet joint, and the superior rim of  the L5 lamina.  The ligament flavum was then elevated and resected in  the usual fashion using Kerrison rongeurs.  Underlying thecal sac and  exiting L5 nerve root were identified.  Wide decompressive  foraminotomies were then performed along the course of the exiting L4  and L5 nerve roots bilaterally.  Epidural venous plexus was coagulated  and cut.  Thecal sac and L5 nerve roots were then gently mobilized and  tracked towards the midline.  Disk herniation was readily apparent.  This was then incised with 15 blade in a rectangular fashion to widen  the disk space.  Clean-out was achieved  using pituitary rongeurs,  upbiting pituitary rongeurs, and Epstein curettes.  All loose  degenerative disk material was then removed from the interspace.  At  this point, a very thorough diskectomy was then performed.  There was no  injury to thecal sac or nerve roots.  A blunt probe passed easily along  the course of the  exiting nerve roots.  There was no injury to thecal  sac or nerve roots.  Gelfoam was placed topically for hemostasis, which  was found to be good.  Microscope and Kerrisons were  removed.  Hemostasis was achieved with electrocautery.  The wound was  closed in layers with Vicryl suture.  Steri-Strips and a sterile  dressings were applied.  There were no complications.  The patient  tolerated the procedure well and she returns comfortable to the recovery  room postoperatively.            ______________________________  Kathaleen Maser Pool, M.D.     HAP/MEDQ  D:  02/19/2008  T:  02/20/2008  Job:  [id number]

## 2010-10-29 NOTE — Op Note (Signed)
   NAMEJUAQUINA, Cassidy Brown                           ACCOUNT NO.:  [id number]   MEDICAL RECORD NO.:  [id number]                   PATIENT TYPE:  AMB   LOCATION:  DSC                                  FACILITY:  [facility]   PHYSICIAN:  Nicki Reaper, M.D.                 DATE OF BIRTH:  Oct 13, 1929   DATE OF PROCEDURE:  01/30/2002  DATE OF DISCHARGE:  01/30/2002                                 OPERATIVE REPORT   PREOPERATIVE DIAGNOSIS:  Mass right index finger distal interphalangeal  joint.   POSTOPERATIVE DIAGNOSIS:  Mass right index finger distal interphalangeal  joint.   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   SURGEONS:  Nicki Reaper, M.D.   ASSISTANT:  R.N.   ANESTHESIA:  Forearm-based IV regional.   ANESTHESIOLOGIST:  Angelina Ok, M.D.   HISTORY:  The patient is a 77 year old female with a history of an enlarged  mass over the distal interphalangeal joint off her right index finger.   DESCRIPTION OF PROCEDURE:  The patient was brought to the operating room  where a forearm-based IV regional anesthetic was carried out without  difficulty.  She was prepped and draped using Betadine scrubbing solution  with the right arm free.  A transverse incision was made over the mass,  carried down through subcutaneous tissue, bleeders were left to cauterize, a  multilobulated tan yellowish mass was immediately encountered, this was  noted to progress proximally on the ulnar aspect.  The incision was made to  the ulnar midlateral line carried down through subcutaneous tissue.  The  plaque was elevated.  With blunt sharp dissection, the mass was dissected  free from the extensor tendon, proceeded beneath the extensor tendon and  into the distal interphalangeal joint.  Pleuritic changes were present to  the joint.  The mass was removed in total.  A rongeur was used to clear any  remaining small fragments.  The wound was irrigated.   The skin was closed  with interrupted 5-0 nylon sutures.  Sterile compressive dressing and splint  to the finger was applied.  The patient tolerated the procedure well and was  taken to the recovery room for observation in satisfactory condition.  She  is discharged home to return to [facility] [place] in 1 week on  Talwin, Xanax and Keflex.                                               Nicki Reaper, M.D.    GRK/MEDQ  D:  01/30/2002  T:  02/01/2002  Job:  [id number]

## 2010-11-10 ENCOUNTER — Telehealth: Payer: Self-pay | Admitting: Family Medicine

## 2010-11-10 NOTE — Telephone Encounter (Signed)
Discuss with patient  

## 2010-11-12 ENCOUNTER — Encounter (HOSPITAL_COMMUNITY)
Admission: RE | Admit: 2010-11-12 | Discharge: 2010-11-12 | Disposition: A | Discharge status: Home / Self Care | Payer: Medicare Other | Source: Ambulatory Visit | Attending: Neurosurgery | Admitting: Neurosurgery

## 2010-11-12 ENCOUNTER — Other Ambulatory Visit (HOSPITAL_COMMUNITY): Payer: Self-pay | Admitting: Neurosurgery

## 2010-11-12 ENCOUNTER — Ambulatory Visit (HOSPITAL_COMMUNITY)
Admission: RE | Admit: 2010-11-12 | Discharge: 2010-11-12 | Disposition: A | Discharge status: Home / Self Care | Payer: Medicare Other | Source: Ambulatory Visit | Attending: Neurosurgery | Admitting: Neurosurgery

## 2010-11-12 DIAGNOSIS — Z01812 Encounter for preprocedural laboratory examination: Secondary | ICD-10-CM | POA: Insufficient documentation

## 2010-11-12 DIAGNOSIS — Z01818 Encounter for other preprocedural examination: Secondary | ICD-10-CM | POA: Insufficient documentation

## 2010-11-12 HISTORY — PX: SPINE SURGERY: SHX786

## 2010-11-12 LAB — BASIC METABOLIC PANEL
BUN: 12 mg/dL (ref 6–23)
Chloride: 101 mEq/L (ref 96–112)
Creatinine, Ser: 0.5 mg/dL (ref 0.4–1.2)
GFR calc Af Amer: >60 mL/min (ref >60)
GFR calc non Af Amer: >60 mL/min (ref >60)
Potassium: 3.6 mEq/L (ref 3.5–5.1)

## 2010-11-12 LAB — CBC
HCT: 38 % (ref 36.0–46.0)
MCV: 89.6 fL (ref 78.0–100.0)
Platelets: 251 10*3/uL (ref 150–400)
RBC: 4.24 MIL/uL (ref 3.87–5.11)
RDW: 14.4 % (ref 11.5–15.5)
WBC: 5.7 10*3/uL (ref 4.0–10.5)

## 2010-11-12 LAB — ABO/RH: ABO/RH(D): A NEG

## 2010-11-12 LAB — SURGICAL PCR SCREEN: MRSA, PCR: NEGATIVE

## 2010-11-12 LAB — TYPE AND SCREEN: Antibody Screen: NEGATIVE

## 2010-11-18 ENCOUNTER — Inpatient Hospital Stay (HOSPITAL_COMMUNITY)
Admission: RE | Admit: 2010-11-18 | Discharge: 2010-11-23 | DRG: 460 | Disposition: A | Discharge status: Home Health | Payer: Medicare Other | Source: Ambulatory Visit | Attending: Neurosurgery | Admitting: Neurosurgery

## 2010-11-18 ENCOUNTER — Inpatient Hospital Stay (HOSPITAL_COMMUNITY): Payer: Medicare Other

## 2010-11-18 DIAGNOSIS — Z01812 Encounter for preprocedural laboratory examination: Secondary | ICD-10-CM

## 2010-11-18 DIAGNOSIS — M5137 Other intervertebral disc degeneration, lumbosacral region: Principal | ICD-10-CM | POA: Diagnosis present

## 2010-11-18 DIAGNOSIS — M51379 Other intervertebral disc degeneration, lumbosacral region without mention of lumbar back pain or lower extremity pain: Principal | ICD-10-CM | POA: Diagnosis present

## 2010-11-18 DIAGNOSIS — I10 Essential (primary) hypertension: Secondary | ICD-10-CM | POA: Diagnosis present

## 2010-11-18 DIAGNOSIS — M4120 Other idiopathic scoliosis, site unspecified: Secondary | ICD-10-CM | POA: Diagnosis present

## 2010-11-19 DIAGNOSIS — IMO0002 Reserved for concepts with insufficient information to code with codable children: Secondary | ICD-10-CM

## 2010-11-19 DIAGNOSIS — M47817 Spondylosis without myelopathy or radiculopathy, lumbosacral region: Secondary | ICD-10-CM

## 2010-11-19 LAB — CBC
HCT: 25.6 % — ABNORMAL LOW (ref 36.0–46.0)
RBC: 2.9 MIL/uL — ABNORMAL LOW (ref 3.87–5.11)
RDW: 14.3 % (ref 11.5–15.5)
WBC: 7.9 10*3/uL (ref 4.0–10.5)

## 2010-11-19 LAB — BASIC METABOLIC PANEL
Chloride: 100 mEq/L (ref 96–112)
GFR calc non Af Amer: >60 mL/min (ref >60)
Glucose, Bld: 110 mg/dL — ABNORMAL HIGH (ref 70–99)
Potassium: 3.9 mEq/L (ref 3.5–5.1)
Sodium: 134 mEq/L — ABNORMAL LOW (ref 135–145)

## 2010-11-20 LAB — CBC
HCT: 25.5 % — ABNORMAL LOW (ref 36.0–46.0)
Hemoglobin: 8.7 g/dL — ABNORMAL LOW (ref 12.0–15.0)
MCHC: 34.1 g/dL (ref 30.0–36.0)
MCV: 88.2 fL (ref 78.0–100.0)
WBC: 11.1 10*3/uL — ABNORMAL HIGH (ref 4.0–10.5)

## 2010-11-21 NOTE — Op Note (Signed)
[name], Cassidy Brown                 ACCOUNT NO.:  [id number]  MEDICAL RECORD NO.:  [id number]  LOCATION:                                 FACILITY:  PHYSICIAN:  Cristi Loron, M.D.DATE OF BIRTH:  Jul 27, 1929  DATE OF PROCEDURE:  11/18/2010 DATE OF DISCHARGE:                              OPERATIVE REPORT   BRIEF HISTORY:  The patient is a 77 year old white female who has undergone previous surgery at L4-5 and 5-1 by another physician.  The patient has had persistent back, hip, and leg pain.  She has failed medical management.  She was worked up with a lumbar MRI which demonstrated the patient had a thoracolumbar scoliosis with severe degeneration, spinal stenosis, etc., at L4-5 and 5-1.  I discussed various treatment options with the patient including surgery.  She has weighed the risks, benefits, and alternatives of surgery and decided to proceed with a redo L4-L5 and 5-1 decompression instrumentation and fusion.  PREOPERATIVE DIAGNOSES:  L4-5 and L5-S1 disk degeneration, spinal stenosis, lumbar radiculopathy, lumbago.  POSTOPERATIVE DIAGNOSES:  L4-5 and L5-S1 disk degeneration, spinal stenosis, lumbar radiculopathy, lumbago.  PROCEDURE:  Redo bilateral L4 and L5 laminotomies, foraminotomies to decompress the bilateral L4-5 and S1 nerve roots (work required to do this was in addition to work required to do posterior lumbar fusion because of the patient's severe foraminal stenosis), facet arthropathy, etc., requiring wide facetectomy and foraminotomy to decompress the bilateral L4-5, S1 nerve roots; L4-5 posterior lumbar interbody fusion with local morselized autograft bone and Actifuse bone graft extender; insertion of L4-5 interbody (Capstone PEEK interbody prosthesis); L4-S1 posterior segmental instrumentation with Legacy titanium pedicle screws and rods; L5-S1 posterolateral arthrodesis with local morselized autograft bone, and Actifuse bone graft extender.  SURGEON:   Cristi Loron, MD  ASSISTANT:  Coletta Memos, MD  ANESTHESIA:  General endotracheal.  ESTIMATED BLOOD LOSS:  50 mL.  SPECIMENS:  None.  DRAINS:  None.  COMPLICATIONS:  None.  DESCRIPTION OF PROCEDURE:  The patient was brought to the operating room by anesthesia team.  General endotracheal anesthesia was induced.  The patient was turned to prone position on the [place] frame.  The lumbosacral region was then prepared with Betadine scrub and Betadine solution.  Sterile drapes were applied.  I then injected the area to be incised with Marcaine with epinephrine solution and a scalpel to make a linear midline incision through the patient's previous surgical scar over the L4-5 and L5-S1 interspaces.  I used electrocautery to provide to perform a bilateral subperiosteal dissection exposing the spinous process lamina of L3-4, 4-5 in the upper sacrum.  We obtained intraoperative radiograph to confirm location and then inserted the Versa-Trac retractor for exposure.  We began decompression using high-speed drill to perform L4 and L5 laminotomies.  In fact be the L5 lamina have pars defect and was "free- floating," so we used [place] rongeur to remove the spinous process and a Kerrison punch to remove the remainder of L5 lamina.  We widened the L4 laminotomies with Kerrison punch, removed the L4-5 and L5-S1 ligamentum flavum and we performed wide foraminotomies about the bilateral L4 and L5 and S1 nerve roots.  This completed  the decompression.  We now turned our attention to the posterior lumbar interbody fusion. We inspected the L4-S1 intervertebral disk.  It was quite spondylotic and collapsed and I did not feel we could do a posterior lumbar interbody fusion here.  We then incised the L4-5 intervertebral disk and performed partial intervertebral discectomy with the pituitary forceps and curettes.  We prepared the vertebral endplates with curettes removing soft tissues.  We then  distracted the interspace and used trial spacers to determine the use of 8 x 22 mm interbody prosthesis.  We prefilled the prosthesis with local autograft bone that we obtained during decompression as well as Actifuse bone graft extender.  We inserted the prosthesis and distracted the L4-5 interspace of course after retracting the neural structures out of harm's way.  We did this bilaterally.  There was good snug fit of prosthesis bilaterally.  We filled the remainder of disk space with local autograft bone and Actifuse.  This completed the posterior lumbar interbody fusion.  We now turned attention to the instrumentation.  Under fluoroscopic guidance, we cannulated bilateral L4, L5-S1 pedicles with bone probe. We tapped the pedicles with a 5.5-mm tap and then inserted 6.5-mm in diameter screws at L4-5, S1 under fluoroscopic guidance.  We then palpated along the medial aspect of the L4-5, S1 pedicles and noted there was no cortical breeches.  We connected unilateral pedicle screws with a lordotic rod which was secured in place with the caps. This completed the instrumentation.  We now turned attention to the posterolateral arthrodesis.  We used a high-speed drill to decorticate the remainder of the L4-5 and 5-1, facets pars, transverse process etc., and laid a combination of local autograft bone and Actifuse bone graft extender over these decorticated posterior structures.  This completed posterolateral arthrodesis.  We then obtained hemostasis using bipolar electrocautery.  We irrigated the wound out with bacitracin solution.  We then inspected the bilateral L4, L5-S1 nerve roots as well as the thecal sacs.  Thecal sac and the neural structure well decompressed.  We then removed the retractor and reapproximated the patient's thoracolumbar fascia with interrupted #1 Vicryl suture, subcutaneous tissue with 2-0 Vicryl suture, and skin with Steri-Strips.  The wound was then coated with  bacitracin ointment and sterile dressing applied.  The drapes were removed and the patient was subsequently returned to supine position where she was extubated by anesthesia team and transported to the Post Anesthesia Care Unit in stable condition.  All sponge, instrument, and needle counts were correct at the end of the case.     Cristi Loron, M.D.    JDJ/MEDQ  D:  11/18/2010  T:  11/19/2010  Job:  [id number]  Electronically Signed by Tressie Stalker M.D. on 11/21/2010 09:57:35 PM

## 2010-11-22 LAB — COMPREHENSIVE METABOLIC PANEL
Alkaline Phosphatase: 91 U/L (ref 39–117)
BUN: 11 mg/dL (ref 6–23)
Chloride: 100 mEq/L (ref 96–112)
Glucose, Bld: 108 mg/dL — ABNORMAL HIGH (ref 70–99)
Potassium: 3.9 mEq/L (ref 3.5–5.1)
Total Bilirubin: 0.4 mg/dL (ref 0.3–1.2)

## 2010-12-01 ENCOUNTER — Other Ambulatory Visit: Payer: Self-pay | Admitting: Neurosurgery

## 2010-12-01 ENCOUNTER — Ambulatory Visit
Admission: RE | Admit: 2010-12-01 | Discharge: 2010-12-01 | Disposition: A | Discharge status: Home / Self Care | Payer: Medicare Other | Source: Ambulatory Visit | Attending: Neurosurgery | Admitting: Neurosurgery

## 2010-12-01 DIAGNOSIS — R52 Pain, unspecified: Secondary | ICD-10-CM

## 2010-12-01 DIAGNOSIS — R609 Edema, unspecified: Secondary | ICD-10-CM

## 2010-12-21 ENCOUNTER — Ambulatory Visit: Payer: Medicare Other | Attending: Neurosurgery | Admitting: Rehabilitation

## 2010-12-21 DIAGNOSIS — IMO0001 Reserved for inherently not codable concepts without codable children: Secondary | ICD-10-CM | POA: Insufficient documentation

## 2010-12-21 DIAGNOSIS — M545 Low back pain, unspecified: Secondary | ICD-10-CM | POA: Insufficient documentation

## 2010-12-21 DIAGNOSIS — R269 Unspecified abnormalities of gait and mobility: Secondary | ICD-10-CM | POA: Insufficient documentation

## 2010-12-21 DIAGNOSIS — M4120 Other idiopathic scoliosis, site unspecified: Secondary | ICD-10-CM | POA: Insufficient documentation

## 2010-12-28 ENCOUNTER — Ambulatory Visit: Payer: Medicare Other | Admitting: Physical Therapy

## 2010-12-30 ENCOUNTER — Ambulatory Visit: Payer: Medicare Other | Admitting: Physical Therapy

## 2010-12-30 NOTE — Discharge Summary (Signed)
  [name], [name]                 ACCOUNT NO.:  [id number]  MEDICAL RECORD NO.:  [id number]  LOCATION:  3032                         FACILITY:  [facility]  PHYSICIAN:  Coletta Memos, M.D.     DATE OF BIRTH:  06-15-1929  DATE OF ADMISSION:  11/18/2010 DATE OF DISCHARGE:  11/23/2010                              DISCHARGE SUMMARY   ADMITTING DIAGNOSES:  L4-5, L5-S1 disk degeneration, spinal stenosis, lumbar radiculopathy and lumbago.  DISCHARGE DIAGNOSES:  L4-5, L5-S1 disk degeneration, spinal stenosis, lumbar radiculopathy and lumbago.  PROCEDURE:  XLIF at L4-5, posterolateral arthrodesis L4-S1, pedicle screw fixation L4-S1, posterior lumbar interbody arthrodesis L4-5.  COMPLICATIONS:  None.  DISCHARGE STATUS:  Alive and well.  DISCHARGE DESTINATION:  Home medications:  Medications will include Percocet for pain and Valium for muscle spasms.  Wound is clean, dry. No signs of infection at discharge.  She is voiding, tolerating a regular diet and has been cleared by Physical Therapy.  She will receive home health PT.  She will have a return appointment with Dr. Lovell Sheehan in 3-4 weeks.          ______________________________ Coletta Memos, M.D.     KC/MEDQ  D:  11/23/2010  T:  11/24/2010  Job:  [id number]  Electronically Signed by Coletta Memos M.D. on 12/30/2010 11:15:46 AM

## 2011-01-03 ENCOUNTER — Ambulatory Visit: Payer: Medicare Other | Admitting: Rehabilitation

## 2011-01-06 ENCOUNTER — Ambulatory Visit: Payer: Medicare Other | Admitting: Physical Therapy

## 2011-01-11 ENCOUNTER — Ambulatory Visit: Payer: Medicare Other | Admitting: Rehabilitation

## 2011-01-13 ENCOUNTER — Ambulatory Visit: Payer: Medicare Other | Attending: Neurosurgery | Admitting: Rehabilitation

## 2011-01-13 DIAGNOSIS — IMO0001 Reserved for inherently not codable concepts without codable children: Secondary | ICD-10-CM | POA: Insufficient documentation

## 2011-01-13 DIAGNOSIS — M545 Low back pain, unspecified: Secondary | ICD-10-CM | POA: Insufficient documentation

## 2011-01-13 DIAGNOSIS — R269 Unspecified abnormalities of gait and mobility: Secondary | ICD-10-CM | POA: Insufficient documentation

## 2011-01-13 DIAGNOSIS — M4120 Other idiopathic scoliosis, site unspecified: Secondary | ICD-10-CM | POA: Insufficient documentation

## 2011-01-18 ENCOUNTER — Encounter: Payer: Medicare Other | Admitting: Physical Therapy

## 2011-01-20 ENCOUNTER — Ambulatory Visit: Payer: Medicare Other | Admitting: Physical Therapy

## 2011-01-25 ENCOUNTER — Ambulatory Visit: Payer: Medicare Other | Admitting: Physical Therapy

## 2011-01-27 ENCOUNTER — Ambulatory Visit: Payer: Medicare Other | Admitting: Physical Therapy

## 2011-02-01 ENCOUNTER — Ambulatory Visit: Payer: Medicare Other | Admitting: Physical Therapy

## 2011-02-03 ENCOUNTER — Ambulatory Visit: Payer: Medicare Other | Admitting: Rehabilitation

## 2011-02-09 ENCOUNTER — Ambulatory Visit: Payer: Medicare Other | Admitting: Physical Therapy

## 2011-02-11 ENCOUNTER — Ambulatory Visit: Payer: Medicare Other | Admitting: Physical Therapy

## 2011-02-15 ENCOUNTER — Ambulatory Visit: Payer: Medicare Other | Attending: Neurosurgery | Admitting: Physical Therapy

## 2011-02-15 DIAGNOSIS — M4120 Other idiopathic scoliosis, site unspecified: Secondary | ICD-10-CM | POA: Insufficient documentation

## 2011-02-15 DIAGNOSIS — M545 Low back pain, unspecified: Secondary | ICD-10-CM | POA: Insufficient documentation

## 2011-02-15 DIAGNOSIS — IMO0001 Reserved for inherently not codable concepts without codable children: Secondary | ICD-10-CM | POA: Insufficient documentation

## 2011-02-15 DIAGNOSIS — R269 Unspecified abnormalities of gait and mobility: Secondary | ICD-10-CM | POA: Insufficient documentation

## 2011-02-17 ENCOUNTER — Ambulatory Visit: Payer: Medicare Other | Admitting: Physical Therapy

## 2011-02-21 ENCOUNTER — Ambulatory Visit: Payer: Medicare Other | Admitting: Physical Therapy

## 2011-03-16 LAB — CBC
RBC: 4.07
WBC: 4.5

## 2011-03-16 LAB — BASIC METABOLIC PANEL
Calcium: 9.1
Creatinine, Ser: 0.56
GFR calc Af Amer: >60
GFR calc non Af Amer: >60

## 2011-03-28 ENCOUNTER — Encounter: Payer: Medicare Other | Admitting: Family Medicine

## 2011-04-20 ENCOUNTER — Other Ambulatory Visit: Payer: Self-pay | Admitting: Family Medicine

## 2011-04-20 MED ORDER — MECLIZINE HCL 25 MG PO TABS: 25.0000 mg | ORAL_TABLET | Freq: Three times a day (TID) | ORAL | Status: DC | PRN

## 2011-04-20 NOTE — Telephone Encounter (Signed)
Patient would like refill  30 day supply of meclizine 25mg  - [facility]

## 2011-04-20 NOTE — Telephone Encounter (Signed)
last seen 11/20/07 # 90 with 2 refills and seen 08/19/10 please advise    KP

## 2011-04-26 ENCOUNTER — Encounter: Payer: Self-pay | Admitting: Family Medicine

## 2011-04-27 ENCOUNTER — Ambulatory Visit (INDEPENDENT_AMBULATORY_CARE_PROVIDER_SITE_OTHER): Payer: Medicare Other | Admitting: Family Medicine

## 2011-04-27 ENCOUNTER — Encounter: Payer: Self-pay | Admitting: Family Medicine

## 2011-04-27 VITALS — BP 132/72 | HR 56 | Temp 98.7°F | Wt 121.0 lb

## 2011-04-27 DIAGNOSIS — F411 Generalized anxiety disorder: Secondary | ICD-10-CM

## 2011-04-27 DIAGNOSIS — Z0000 Encounter for general adult medical examination without abnormal findings: Secondary | ICD-10-CM

## 2011-04-27 DIAGNOSIS — F419 Anxiety disorder, unspecified: Secondary | ICD-10-CM

## 2011-04-27 DIAGNOSIS — I10 Essential (primary) hypertension: Secondary | ICD-10-CM

## 2011-04-27 DIAGNOSIS — R42 Dizziness and giddiness: Secondary | ICD-10-CM

## 2011-04-27 DIAGNOSIS — N390 Urinary tract infection, site not specified: Secondary | ICD-10-CM

## 2011-04-27 DIAGNOSIS — Z23 Encounter for immunization: Secondary | ICD-10-CM

## 2011-04-27 LAB — POCT URINALYSIS DIPSTICK
Bilirubin, UA: NEGATIVE
Glucose, UA: NEGATIVE
Ketones, UA: NEGATIVE

## 2011-04-27 MED ORDER — TERAZOSIN HCL 5 MG PO CAPS: 5.0000 mg | ORAL_CAPSULE | Freq: Every day | ORAL | Status: DC

## 2011-04-27 MED ORDER — LORAZEPAM 1 MG PO TABS: 1.0000 mg | ORAL_TABLET | Freq: Three times a day (TID) | ORAL | Status: DC

## 2011-04-27 MED ORDER — TETANUS-DIPHTH-ACELL PERTUSSIS 5-2.5-18.5 LF-MCG/0.5 IM SUSP: 0.5000 mL | Freq: Once | INTRAMUSCULAR | Status: DC

## 2011-04-27 MED ORDER — ATENOLOL 50 MG PO TABS: 75.0000 mg | ORAL_TABLET | Freq: Every day | ORAL | Status: DC

## 2011-04-27 MED ORDER — MECLIZINE HCL 25 MG PO TABS: 25.0000 mg | ORAL_TABLET | Freq: Three times a day (TID) | ORAL | Status: AC | PRN

## 2011-04-27 NOTE — Progress Notes (Signed)
Addended by: Arnette Norris on: 04/27/2011 05:03 PM   Modules accepted: Orders

## 2011-04-27 NOTE — Patient Instructions (Signed)

## 2011-04-27 NOTE — Progress Notes (Signed)
Subjective:    Cassidy Brown is a 77 y.o. female who presents for Medicare Annual/Subsequent preventive examination.  Preventive Screening-Counseling & Management  Tobacco History  Smoking status  . Never Smoker   Smokeless tobacco  . Not on file     Problems Prior to Visit 1.   Current Problems (verified) Patient Active Problem List  Diagnoses  . HYPERTENSION  . DIVERTICULOSIS, COLON  . OSTEOPOROSIS  . SCOLIOSIS  . DIZZINESS OR VERTIGO  . BREAST CANCER, HX OF  . POLIOMYELITIS, HX OF  . BACK PAIN, CHRONIC    Medications Prior to Visit Current Outpatient Prescriptions on File Prior to Visit  Medication Sig Dispense Refill  . cholecalciferol (VITAMIN D) 1000 UNITS tablet Take 1,000 Units by mouth daily.        Marland Kitchen loratadine (CLARITIN) 10 MG tablet Take 10 mg by mouth daily.        . pseudoephedrine (SUDAFED) 30 MG tablet Take 30 mg by mouth as needed.        . zoster vaccine live, PF, (ZOSTAVAX) 78295 UNT/0.65ML injection Inject 0.65 mLs into the skin once.        Marland Kitchen DISCONTD: terazosin (HYTRIN) 5 MG capsule Take 5 mg by mouth at bedtime.          Current Medications (verified) Current Outpatient Prescriptions  Medication Sig Dispense Refill  . atenolol (TENORMIN) 50 MG tablet Take 1.5 tablets (75 mg total) by mouth daily.  135 tablet  3  . cholecalciferol (VITAMIN D) 1000 UNITS tablet Take 1,000 Units by mouth daily.        Marland Kitchen loratadine (CLARITIN) 10 MG tablet Take 10 mg by mouth daily.        Marland Kitchen LORazepam (ATIVAN) 1 MG tablet Take 1 tablet (1 mg total) by mouth every 8 (eight) hours.  60 tablet  1  . meclizine (ANTIVERT) 25 MG tablet Take 1 tablet (25 mg total) by mouth 3 (three) times daily as needed.  60 tablet  1  . pseudoephedrine (SUDAFED) 30 MG tablet Take 30 mg by mouth as needed.        . terazosin (HYTRIN) 5 MG capsule Take 1 capsule (5 mg total) by mouth at bedtime.  90 capsule  3  . zoster vaccine live, PF, (ZOSTAVAX) 62130 UNT/0.65ML injection Inject 0.65  mLs into the skin once.        Marland Kitchen DISCONTD: terazosin (HYTRIN) 5 MG capsule Take 5 mg by mouth at bedtime.        Marland Kitchen DISCONTD: terazosin (HYTRIN) 5 MG capsule Take 1 capsule (5 mg total) by mouth at bedtime.  90 capsule  3  . TDaP (BOOSTRIX) 5-2.5-18.5 LF-MCG/0.5 injection Inject 0.5 mLs into the muscle once.  0.5 mL  0     Allergies (verified) Hydrocodone and Propoxyphene n-acetaminophen   PAST HISTORY  Family History Family History  Problem Relation Age of Onset  . Coronary artery disease Other     P uncles  . Hypertension Father   . Kidney disease Father   . Hypertension Sister     Social History History  Substance Use Topics  . Smoking status: Never Smoker   . Smokeless tobacco: Not on file  . Alcohol Use: No     Are there smokers in your home (other than you)? Yes  Risk Factors Current exercise habits: Home exercise routine includes treadmill.  Dietary issues discussed: NA   Cardiac risk factors: advanced age (older than 57 for men, 68 for women)  and hypertension.  Depression Screen (Note: if answer to either of the following is "Yes", a more complete depression screening is indicated)   Over the past two weeks, have you felt down, depressed or hopeless? No  Over the past two weeks, have you felt little interest or pleasure in doing things? No  Have you lost interest or pleasure in daily life? No  Do you often feel hopeless? No  Do you cry easily over simple problems? No  Activities of Daily Living In your present state of health, do you have any difficulty performing the following activities?:  Driving? No Managing money?  No Feeding yourself? No Getting from bed to chair? no Climbing a flight of stairs? Yes --- hx polio,  And recent back surgery Preparing food and eating?: No Bathing or showering? No Getting dressed: No Getting to the toilet? No Using the toilet:No Moving around from place to place: No In the past year have you fallen or had a near  fall?:No   Are you sexually active?  Yes  Do you have more than one partner?  No  Hearing Difficulties: No Do you often ask people to speak up or repeat themselves? No Do you experience ringing or noises in your ears? No Do you have difficulty understanding soft or whispered voices? No   Do you feel that you have a problem with memory? No  Do you often misplace items? No  Do you feel safe at home?  Yes  Cognitive Testing  Alert? Yes  Normal Appearance?Yes  Oriented to person? Yes  Place? Yes   Time? Yes  Recall of three objects?  Yes  Can perform simple calculations? Yes  Displays appropriate judgment?Yes  Can read the correct time from a watch face?Yes   Advanced Directives have been discussed with the patient? Yes  List the Names of Other Physician/Practitioners you currently use: 1.  Gyn-- Dr white 2.  opth-- Dr Gerarda Gunther 3.  Dentist-- Dr Lucilla Lame  Indicate any recent Medical Services you may have received from other than [facility] providers in the past year (date may be approximate).  Immunization History  Administered Date(s) Administered  . Influenza Whole 05/03/2007, 03/07/2008, 03/11/2009, 03/16/2010, 04/27/2010  . Pneumococcal Polysaccharide 03/11/2009    Screening Tests Health Maintenance  Topic Date Due  . Tetanus/tdap  02/04/1949  . Influenza Vaccine  03/14/2011  . Colonoscopy  04/27/2011  . Pneumococcal Polysaccharide Vaccine Age 42 And Over  Completed  . Zostavax  Completed    All answers were reviewed with the patient and necessary referrals were made:  Loreen Freud, DO   04/27/2011   History reviewed: allergies, current medications, past family history, past medical history, past social history, past surgical history and problem list  Review of Systems  Review of Systems  Constitutional: Negative for activity change, appetite change and fatigue.  HENT: Negative for hearing loss, congestion, tinnitus and ear discharge.  dentist--q23m Eyes: Negative for  visual disturbance (see optho q1y -- vision corrected to 20/20 with glasses).  Respiratory: Negative for cough, chest tightness and shortness of breath.   Cardiovascular: Negative for chest pain, palpitations and leg swelling.  Gastrointestinal: Negative for abdominal pain, diarrhea, constipation and abdominal distention.  Genitourinary: Negative for urgency, frequency, decreased urine volume and difficulty urinating.  Musculoskeletal: Negative for back pain, arthralgias and gait problem.  Skin: Negative for color change, pallor and rash.  Neurological: Negative for dizziness, light-headedness, numbness and headaches.  Hematological: Negative for adenopathy. Does not bruise/bleed easily.  Psychiatric/Behavioral:  Negative for suicidal ideas, confusion, sleep disturbance, self-injury, dysphoric mood, decreased concentration and agitation.  Pt is able to read and write and can do all ADLs No risk for falling No abuse/ violence in home       Objective:     Vision by Snellen chart: ophth  There is no height on file to calculate BMI. BP 132/72  Pulse 56  Temp(Src) 98.7 F (37.1 C) (Oral)  Wt 121 lb (54.885 kg)  SpO2 98%  BP 132/72  Pulse 56  Temp(Src) 98.7 F (37.1 C) (Oral)  Wt 121 lb (54.885 kg)  SpO2 98% General appearance: alert, cooperative, appears stated age and no distress Head: Normocephalic, without obvious abnormality, atraumatic Eyes: conjunctivae/corneas clear. PERRL, EOM's intact. Fundi benign. Ears: normal TM's and external ear canals both ears Nose: Nares normal. Septum midline. Mucosa normal. No drainage or sinus tenderness. Throat: lips, mucosa, and tongue normal; teeth and gums normal Neck: no adenopathy, no carotid bruit, no JVD, supple, symmetrical, trachea midline and thyroid not enlarged, symmetric, no tenderness/mass/nodules Back: symmetric, no curvature. ROM normal. No CVA tenderness. Lungs: clear to auscultation bilaterally Breasts: gyn Heart: regular  rate and rhythm, S1, S2 normal, no murmur, click, rub or gallop Abdomen: soft, NT,  + BS,  No organomegaly Pelvic: gyn Extremities: extremities normal, atraumatic, no cyanosis or edema Pulses: 2+ and symmetric Skin: Skin color, texture, turgor normal. No rashes or lesions Lymph nodes: Cervical, supraclavicular, and axillary nodes normal. Neurologic: Alert and oriented X 3, normal strength and tone. Normal symmetric reflexes. Normal coordination and gait Psych-- no anxiety / depression     Assessment:     cpe htn-- stable ,  con't meds Vertigo---prn meclizine     Plan:     During the course of the visit the patient was educated and counseled about appropriate screening and preventive services including:    Pneumococcal vaccine   Influenza vaccine  Td vaccine  Screening mammography  Screening Pap smear and pelvic exam   Bone densitometry screening  Colorectal cancer screening  Advanced directives: has NO advanced directive - not interested in additional information  Diet review for nutrition referral? Yes ____  Not Indicated ___x_   Patient Instructions (the written plan) was given to the patient.  Medicare Attestation I have personally reviewed: The patient's medical and social history Their use of alcohol, tobacco or illicit drugs Their current medications and supplements The patient's functional ability including ADLs,fall risks, home safety risks, cognitive, and hearing and visual impairment Diet and physical activities Evidence for depression or mood disorders  The patient's weight, height, BMI, and visual acuity have been recorded in the chart.  I have made referrals, counseling, and provided education to the patient based on review of the above and I have provided the patient with a written personalized care plan for preventive services.     Loreen Freud, DO   04/27/2011

## 2011-04-28 LAB — URINE CULTURE: Colony Count: 45000

## 2011-04-29 LAB — CBC WITH DIFFERENTIAL/PLATELET
Basophils Absolute: 0 10*3/uL (ref 0.0–0.1)
HCT: 34.5 % — ABNORMAL LOW (ref 36.0–46.0)
Hemoglobin: 11.4 g/dL — ABNORMAL LOW (ref 12.0–15.0)
Lymphs Abs: 0.9 10*3/uL (ref 0.7–4.0)
MCHC: 33.1 g/dL (ref 30.0–36.0)
Monocytes Relative: 8.5 % (ref 3.0–12.0)
Neutro Abs: 3.1 10*3/uL (ref 1.4–7.7)
RDW: 16.8 % — ABNORMAL HIGH (ref 11.5–14.6)

## 2011-04-29 LAB — LIPID PANEL
Cholesterol: 162 mg/dL (ref 0–200)
VLDL: 16.6 mg/dL (ref 0.0–40.0)

## 2011-04-29 LAB — BASIC METABOLIC PANEL
CO2: 30 mEq/L (ref 19–32)
GFR: 118.2 mL/min (ref >60.00)
Glucose, Bld: 86 mg/dL (ref 70–99)
Potassium: 3.9 mEq/L (ref 3.5–5.1)
Sodium: 140 mEq/L (ref 135–145)

## 2011-04-29 LAB — HEPATIC FUNCTION PANEL
ALT: 18 U/L (ref 0–35)
AST: 26 U/L (ref 0–37)
Albumin: 3.8 g/dL (ref 3.5–5.2)
Total Protein: 6.3 g/dL (ref 6.0–8.3)

## 2011-06-17 ENCOUNTER — Encounter: Payer: Self-pay | Admitting: Family Medicine

## 2011-06-28 ENCOUNTER — Telehealth: Payer: Self-pay

## 2011-06-28 NOTE — Telephone Encounter (Signed)
Call from patient and she stated she wanted to go ahead and start the [employer].

## 2011-06-29 NOTE — Telephone Encounter (Signed)
insurance card received and Information faxed to [employer].     KP

## 2011-07-11 ENCOUNTER — Other Ambulatory Visit: Payer: Self-pay

## 2011-07-11 DIAGNOSIS — I10 Essential (primary) hypertension: Secondary | ICD-10-CM

## 2011-07-11 MED ORDER — TERAZOSIN HCL 5 MG PO CAPS: 5.0000 mg | ORAL_CAPSULE | Freq: Every day | ORAL | Status: DC

## 2011-07-11 MED ORDER — ATENOLOL 50 MG PO TABS: 75.0000 mg | ORAL_TABLET | Freq: Every day | ORAL | Status: DC

## 2011-07-11 NOTE — Telephone Encounter (Signed)
Faxed.   KP 

## 2011-07-12 ENCOUNTER — Other Ambulatory Visit (INDEPENDENT_AMBULATORY_CARE_PROVIDER_SITE_OTHER): Payer: Medicare Other

## 2011-07-12 DIAGNOSIS — Z01818 Encounter for other preprocedural examination: Secondary | ICD-10-CM

## 2011-07-13 ENCOUNTER — Telehealth: Payer: Self-pay

## 2011-07-13 LAB — BASIC METABOLIC PANEL
BUN: 16 mg/dL (ref 6–23)
CO2: 29 mEq/L (ref 19–32)
Chloride: 103 mEq/L (ref 96–112)
Glucose, Bld: 108 mg/dL — ABNORMAL HIGH (ref 70–99)
Potassium: 4 mEq/L (ref 3.5–5.1)

## 2011-07-13 LAB — CBC WITH DIFFERENTIAL/PLATELET
Basophils Relative: 1.2 % (ref 0.0–3.0)
Eosinophils Absolute: 0.1 10*3/uL (ref 0.0–0.7)
MCHC: 33.7 g/dL (ref 30.0–36.0)
MCV: 89.2 fl (ref 78.0–100.0)
Monocytes Absolute: 0.5 10*3/uL (ref 0.1–1.0)
Neutro Abs: 1.4 10*3/uL (ref 1.4–7.7)
Neutrophils Relative %: 36.7 % — ABNORMAL LOW (ref 43.0–77.0)
RBC: 3.99 Mil/uL (ref 3.87–5.11)

## 2011-07-13 LAB — IBC PANEL: Transferrin: 248.1 mg/dL (ref 212.0–360.0)

## 2011-07-13 NOTE — Telephone Encounter (Signed)
Error.     KP 

## 2011-11-25 ENCOUNTER — Encounter: Payer: Self-pay | Admitting: Family Medicine

## 2012-03-02 ENCOUNTER — Other Ambulatory Visit (HOSPITAL_COMMUNITY): Payer: Self-pay | Admitting: Neurosurgery

## 2012-03-02 ENCOUNTER — Other Ambulatory Visit: Payer: Self-pay | Admitting: Neurosurgery

## 2012-03-02 DIAGNOSIS — M4310 Spondylolisthesis, site unspecified: Secondary | ICD-10-CM

## 2012-03-09 ENCOUNTER — Ambulatory Visit (HOSPITAL_COMMUNITY)
Admission: RE | Admit: 2012-03-09 | Discharge: 2012-03-09 | Disposition: A | Discharge status: Home / Self Care | Payer: Medicare Other | Source: Ambulatory Visit | Attending: Neurosurgery | Admitting: Neurosurgery

## 2012-03-09 DIAGNOSIS — Z853 Personal history of malignant neoplasm of breast: Secondary | ICD-10-CM | POA: Insufficient documentation

## 2012-03-09 DIAGNOSIS — M4310 Spondylolisthesis, site unspecified: Secondary | ICD-10-CM

## 2012-03-09 DIAGNOSIS — I10 Essential (primary) hypertension: Secondary | ICD-10-CM | POA: Insufficient documentation

## 2012-03-09 DIAGNOSIS — Z981 Arthrodesis status: Secondary | ICD-10-CM | POA: Insufficient documentation

## 2012-03-09 DIAGNOSIS — M810 Age-related osteoporosis without current pathological fracture: Secondary | ICD-10-CM | POA: Insufficient documentation

## 2012-03-09 DIAGNOSIS — M4180 Other forms of scoliosis, site unspecified: Secondary | ICD-10-CM | POA: Insufficient documentation

## 2012-03-09 MED ORDER — MORPHINE SULFATE 4 MG/ML IJ SOLN: 2.0000 mg | INTRAMUSCULAR | Status: DC | PRN

## 2012-03-09 MED ORDER — ONDANSETRON HCL 4 MG/2ML IJ SOLN: 4.0000 mg | Freq: Four times a day (QID) | INTRAMUSCULAR | Status: DC | PRN

## 2012-03-09 MED ORDER — IOHEXOL 180 MG/ML  SOLN
20.0000 mL | Freq: Once | INTRAMUSCULAR | Status: AC | PRN
  Administered 2012-03-09: 20 mL via INTRATHECAL

## 2012-03-09 MED ORDER — DIAZEPAM 5 MG PO TABS
ORAL_TABLET | ORAL | Status: AC
  Administered 2012-03-09: 10 mg via ORAL
  Filled 2012-03-09: qty 2

## 2012-03-09 MED ORDER — DIAZEPAM 5 MG PO TABS
10.0000 mg | ORAL_TABLET | Freq: Once | ORAL | Status: AC
  Administered 2012-03-09: 10 mg via ORAL

## 2012-03-09 MED ORDER — DIAZEPAM 5 MG PO TABS: 5.0000 mg | ORAL_TABLET | Freq: Once | ORAL | Status: DC

## 2012-03-09 MED ORDER — HYDROMORPHONE HCL 2 MG PO TABS: 2.0000 mg | ORAL_TABLET | ORAL | Status: DC | PRN

## 2012-03-09 NOTE — H&P (Signed)
Subjective: The patient is an 78 year old white female complains of back and leg pain. I discussed working her up further with a lumbar mild CT. The patient has decided proceed with that study.   Past Medical History  Diagnosis Date  . Diverticulosis   . Hypertension   . Osteoporosis   . Cancer     breast,hx of  . Vertigo     dizziness  . Poliomyelitis   . Allergy     Past Surgical History  Procedure Date  . Breast surgery   . Lumbar laminectomy   . Dilation and curettage of uterus     x3  . Ganglion cyst r wrist   . Spine surgery 11/2010    fusion--    Allergies  Allergen Reactions  . Hydrocodone   . Propoxyphene-Acetaminophen     History  Substance Use Topics  . Smoking status: Never Smoker   . Smokeless tobacco: Not on file  . Alcohol Use: No    Family History  Problem Relation Age of Onset  . Coronary artery disease Other     P uncles  . Hypertension Father   . Kidney disease Father   . Hypertension Sister    Prior to Admission medications   Medication Sig Start Date End Date Taking? Authorizing Provider  atenolol (TENORMIN) 50 MG tablet Take 1.5 tablets (75 mg total) by mouth daily. 07/11/11  Yes Yvonne R Lowne, DO  Bilberry, Vaccinium myrtillus, (BILBERRY PO) Take 1 capsule by mouth daily.   Yes Historical Provider, MD  calcium citrate-vitamin D (CITRACAL+D) 315-200 MG-UNIT per tablet Take 1 tablet by mouth daily.   Yes Historical Provider, MD  cholecalciferol (VITAMIN D) 1000 UNITS tablet Take 1,000 Units by mouth daily.     Yes Historical Provider, MD  Ginkgo Biloba (GINKGO PO) Take 1 tablet by mouth daily.   Yes Historical Provider, MD  glucosamine-chondroitin 500-400 MG tablet Take 1 tablet by mouth 3 (three) times daily.   Yes Historical Provider, MD  loratadine (CLARITIN) 10 MG tablet Take 10 mg by mouth daily.     Yes Historical Provider, MD  LORazepam (ATIVAN) 1 MG tablet Take 1 tablet (1 mg total) by mouth every 8 (eight) hours. 04/27/11  Yes  Yvonne R Lowne, DO  MAGNESIUM PO Take 50 mg by mouth daily.   Yes Historical Provider, MD  Multiple Vitamin (MULTIVITAMIN WITH MINERALS) TABS Take 1 tablet by mouth daily.   Yes Historical Provider, MD  multivitamin-lutein (OCUVITE-LUTEIN) CAPS Take 1 capsule by mouth daily.   Yes Historical Provider, MD  pseudoephedrine (SUDAFED) 30 MG tablet Take 30 mg by mouth as needed.     Yes Historical Provider, MD  terazosin (HYTRIN) 5 MG capsule Take 1 capsule (5 mg total) by mouth at bedtime. 07/11/11  Yes Grayling Congress Lowne, DO  thiamine (VITAMIN B-1) 100 MG tablet Take 100 mg by mouth daily.   Yes Historical Provider, MD     Review of Systems  Positive ROS: As above  All other systems have been reviewed and were otherwise negative with the exception of those mentioned in the HPI and as above.  Objective: Vital signs in last 24 hours: Temp:  [97 F (36.1 C)] 97 F (36.1 C) (09/27 0623) Pulse Rate:  [53] 53  (09/27 0623) Resp:  [18] 18  (09/27 0623) BP: (183)/(76) 183/76 mmHg (09/27 0623) SpO2:  [94 %] 94 % (09/27 0623) Weight:  [54.432 kg (120 lb)] 54.432 kg (120 lb) (09/27 0623)  General Appearance:  Alert, cooperative, no distress, appears stated age Head: Normocephalic, without obvious abnormality, atraumatic Eyes: PERRL, conjunctiva/corneas clear, EOM's intact, fundi benign, both eyes      Ears: Normal TM's and external ear canals, both ears Throat: Lips, mucosa, and tongue normal; teeth and gums normal Neck: Supple, symmetrical, trachea midline, no adenopathy; thyroid: No enlargement/tenderness/nodules; no carotid bruit or JVD Back: Symmetric, no curvature, ROM normal, no CVA tenderness Lungs: Clear to auscultation bilaterally, respirations unlabored Heart: Regular rate and rhythm, S1 and S2 normal, no murmur, rub or gallop Abdomen: Soft, non-tender, bowel sounds active all four quadrants, no masses, no organomegaly Extremities: Extremities normal, atraumatic, no cyanosis or  edema Pulses: 2+ and symmetric all extremities Skin: Skin color, texture, turgor normal, no rashes or lesions  NEUROLOGIC:   Mental status: alert and oriented, no aphasia, good attention span, Fund of knowledge/ memory ok Motor Exam - grossly normal Sensory Exam - grossly normal Reflexes:  Coordination - grossly normal Gait - grossly normal Balance - grossly normal Cranial Nerves: I: smell Not tested  II: visual acuity  OS: Normal    OD: Normal   II: visual fields Full to confrontation  II: pupils Equal, round, reactive to light  III,VII: ptosis None  III,IV,VI: extraocular muscles  Full ROM  V: mastication Normal  V: facial light touch sensation  Normal  V,VII: corneal reflex  Present  VII: facial muscle function - upper  Normal  VII: facial muscle function - lower Normal  VIII: hearing Not tested  IX: soft palate elevation  Normal  IX,X: gag reflex Present  XI: trapezius strength  5/5  XI: sternocleidomastoid strength 5/5  XI: neck flexion strength  5/5  XII: tongue strength  Normal    Data Review Lab Results  Component Value Date   WBC 3.7* 07/12/2011   HGB 12.0 07/12/2011   HCT 35.5* 07/12/2011   MCV 89.2 07/12/2011   PLT 207.0 07/12/2011   Lab Results  Component Value Date   NA 140 07/12/2011   K 4.0 07/12/2011   CL 103 07/12/2011   CO2 29 07/12/2011   BUN 16 07/12/2011   CREATININE 0.7 07/12/2011   GLUCOSE 108* 07/12/2011   No results found for this basename: INR, PROTIME    Assessment/Plan: Lumbago, lumbar radiculopathy: I discussed working the patient up further with a lumbar mild CT nor to evaluate her instrumentation, rule out pseudarthrosis, stenosis etc. The patient has weighed the risks, benefits, and alternatives to procedure decided proceed with the study.   , D 03/09/2012 7:45 AM

## 2012-03-27 ENCOUNTER — Ambulatory Visit (INDEPENDENT_AMBULATORY_CARE_PROVIDER_SITE_OTHER): Payer: Medicare Other

## 2012-03-27 DIAGNOSIS — Z23 Encounter for immunization: Secondary | ICD-10-CM

## 2012-04-12 ENCOUNTER — Telehealth: Payer: Self-pay | Admitting: Family Medicine

## 2012-04-12 DIAGNOSIS — M810 Age-related osteoporosis without current pathological fracture: Secondary | ICD-10-CM

## 2012-04-12 NOTE — Telephone Encounter (Signed)
isnt that a gyn office---- we can send order but not sure why they couldn't do it.

## 2012-04-12 NOTE — Telephone Encounter (Signed)
Patient called stating [facility] needs an order for the pt to have her bone density. Fax # [phone]

## 2012-04-12 NOTE — Telephone Encounter (Signed)
Order faxed.    KP 

## 2012-04-12 NOTE — Telephone Encounter (Signed)
Please advise      KP 

## 2012-05-29 LAB — HM DEXA SCAN

## 2012-07-10 ENCOUNTER — Encounter: Payer: Self-pay | Admitting: Lab

## 2012-07-10 ENCOUNTER — Encounter: Payer: Medicare Other | Admitting: Family Medicine

## 2012-07-12 ENCOUNTER — Ambulatory Visit (INDEPENDENT_AMBULATORY_CARE_PROVIDER_SITE_OTHER): Payer: Medicare Other | Admitting: Family Medicine

## 2012-07-12 ENCOUNTER — Encounter: Payer: Self-pay | Admitting: Family Medicine

## 2012-07-12 VITALS — BP 178/76 | HR 56 | Temp 98.3°F | Ht 62.0 in | Wt 126.4 lb

## 2012-07-12 DIAGNOSIS — M1990 Unspecified osteoarthritis, unspecified site: Secondary | ICD-10-CM

## 2012-07-12 DIAGNOSIS — M129 Arthropathy, unspecified: Secondary | ICD-10-CM

## 2012-07-12 DIAGNOSIS — Z0000 Encounter for general adult medical examination without abnormal findings: Secondary | ICD-10-CM

## 2012-07-12 DIAGNOSIS — N390 Urinary tract infection, site not specified: Secondary | ICD-10-CM

## 2012-07-12 DIAGNOSIS — R7309 Other abnormal glucose: Secondary | ICD-10-CM

## 2012-07-12 DIAGNOSIS — R739 Hyperglycemia, unspecified: Secondary | ICD-10-CM

## 2012-07-12 DIAGNOSIS — I10 Essential (primary) hypertension: Secondary | ICD-10-CM

## 2012-07-12 LAB — POCT URINALYSIS DIPSTICK
Glucose, UA: NEGATIVE
Nitrite, UA: NEGATIVE
Protein, UA: NEGATIVE
Spec Grav, UA: <=1.005
Urobilinogen, UA: 0.2

## 2012-07-12 LAB — LIPID PANEL
Cholesterol: 140 mg/dL (ref 0–200)
LDL Cholesterol: 62 mg/dL (ref 0–99)
VLDL: 15.8 mg/dL (ref 0.0–40.0)

## 2012-07-12 LAB — HEPATIC FUNCTION PANEL
ALT: 16 U/L (ref 0–35)
AST: 21 U/L (ref 0–37)
Albumin: 3.6 g/dL (ref 3.5–5.2)
Alkaline Phosphatase: 118 U/L — ABNORMAL HIGH (ref 39–117)
Total Bilirubin: 0.6 mg/dL (ref 0.3–1.2)

## 2012-07-12 LAB — CBC WITH DIFFERENTIAL/PLATELET
Eosinophils Relative: 2.5 % (ref 0.0–5.0)
HCT: 36 % (ref 36.0–46.0)
Hemoglobin: 12 g/dL (ref 12.0–15.0)
Lymphocytes Relative: 20.6 % (ref 12.0–46.0)
Lymphs Abs: 0.9 10*3/uL (ref 0.7–4.0)
Monocytes Relative: 10.2 % (ref 3.0–12.0)
Neutro Abs: 2.7 10*3/uL (ref 1.4–7.7)
WBC: 4.1 10*3/uL — ABNORMAL LOW (ref 4.5–10.5)

## 2012-07-12 LAB — BASIC METABOLIC PANEL
Calcium: 9 mg/dL (ref 8.4–10.5)
GFR: 100.19 mL/min (ref >60.00)
Potassium: 4.3 mEq/L (ref 3.5–5.1)
Sodium: 139 mEq/L (ref 135–145)

## 2012-07-12 LAB — HEMOGLOBIN A1C: Hgb A1c MFr Bld: 5.5 % (ref 4.6–6.5)

## 2012-07-12 MED ORDER — TERAZOSIN HCL 10 MG PO CAPS: 10.0000 mg | ORAL_CAPSULE | Freq: Every day | ORAL | Status: DC

## 2012-07-12 MED ORDER — MELOXICAM 15 MG PO TABS: 15.0000 mg | ORAL_TABLET | Freq: Every day | ORAL | Status: DC

## 2012-07-12 MED ORDER — ATENOLOL 50 MG PO TABS: 75.0000 mg | ORAL_TABLET | Freq: Every day | ORAL | Status: DC

## 2012-07-12 NOTE — Patient Instructions (Addendum)
Preventive Care for Adults, Female A healthy lifestyle and preventive care can promote health and wellness. Preventive health guidelines for women include the following key practices.  A routine yearly physical is a good way to check with your caregiver about your health and preventive screening. It is a chance to share any concerns and updates on your health, and to receive a thorough exam.  Visit your dentist for a routine exam and preventive care every 6 months. Brush your teeth twice a day and floss once a day. Good oral hygiene prevents tooth decay and gum disease.  The frequency of eye exams is based on your age, health, family medical history, use of contact lenses, and other factors. Follow your caregiver's recommendations for frequency of eye exams.  Eat a healthy diet. Foods like vegetables, fruits, whole grains, low-fat dairy products, and lean protein foods contain the nutrients you need without too many calories. Decrease your intake of foods high in solid fats, added sugars, and salt. Eat the right amount of calories for you.Get information about a proper diet from your caregiver, if necessary.  Regular physical exercise is one of the most important things you can do for your health. Most adults should get at least 150 minutes of moderate-intensity exercise (any activity that increases your heart rate and causes you to sweat) each week. In addition, most adults need muscle-strengthening exercises on 2 or more days a week.  Maintain a healthy weight. The body mass index (BMI) is a screening tool to identify possible weight problems. It provides an estimate of body fat based on height and weight. Your caregiver can help determine your BMI, and can help you achieve or maintain a healthy weight.For adults 20 years and older:  A BMI below 18.5 is considered underweight.  A BMI of 18.5 to 24.9 is normal.  A BMI of 25 to 29.9 is considered overweight.  A BMI of 30 and above is  considered obese.  Maintain normal blood lipids and cholesterol levels by exercising and minimizing your intake of saturated fat. Eat a balanced diet with plenty of fruit and vegetables. Blood tests for lipids and cholesterol should begin at age 20 and be repeated every 5 years. If your lipid or cholesterol levels are high, you are over 50, or you are at high risk for heart disease, you may need your cholesterol levels checked more frequently.Ongoing high lipid and cholesterol levels should be treated with medicines if diet and exercise are not effective.  If you smoke, find out from your caregiver how to quit. If you do not use tobacco, do not start.  If you are pregnant, do not drink alcohol. If you are breastfeeding, be very cautious about drinking alcohol. If you are not pregnant and choose to drink alcohol, do not exceed 1 drink per day. One drink is considered to be 12 ounces (355 mL) of beer, 5 ounces (148 mL) of wine, or 1.5 ounces (44 mL) of liquor.  Avoid use of street drugs. Do not share needles with anyone. Ask for help if you need support or instructions about stopping the use of drugs.  High blood pressure causes heart disease and increases the risk of stroke. Your blood pressure should be checked at least every 1 to 2 years. Ongoing high blood pressure should be treated with medicines if weight loss and exercise are not effective.  If you are 55 to 77 years old, ask your caregiver if you should take aspirin to prevent strokes.  Diabetes   screening involves taking a blood sample to check your fasting blood sugar level. This should be done once every 3 years, after age 45, if you are within normal weight and without risk factors for diabetes. Testing should be considered at a younger age or be carried out more frequently if you are overweight and have at least 1 risk factor for diabetes.  Breast cancer screening is essential preventive care for women. You should practice "breast  self-awareness." This means understanding the normal appearance and feel of your breasts and may include breast self-examination. Any changes detected, no matter how small, should be reported to a caregiver. Women in their 20s and 30s should have a clinical breast exam (CBE) by a caregiver as part of a regular health exam every 1 to 3 years. After age 40, women should have a CBE every year. Starting at age 40, women should consider having a mammography (breast X-ray test) every year. Women who have a family history of breast cancer should talk to their caregiver about genetic screening. Women at a high risk of breast cancer should talk to their caregivers about having magnetic resonance imaging (MRI) and a mammography every year.  The Pap test is a screening test for cervical cancer. A Pap test can show cell changes on the cervix that might become cervical cancer if left untreated. A Pap test is a procedure in which cells are obtained and examined from the lower end of the uterus (cervix).  Women should have a Pap test starting at age 21.  Between ages 21 and 29, Pap tests should be repeated every 2 years.  Beginning at age 30, you should have a Pap test every 3 years as long as the past 3 Pap tests have been normal.  Some women have medical problems that increase the chance of getting cervical cancer. Talk to your caregiver about these problems. It is especially important to talk to your caregiver if a new problem develops soon after your last Pap test. In these cases, your caregiver may recommend more frequent screening and Pap tests.  The above recommendations are the same for women who have or have not gotten the vaccine for human papillomavirus (HPV).  If you had a hysterectomy for a problem that was not cancer or a condition that could lead to cancer, then you no longer need Pap tests. Even if you no longer need a Pap test, a regular exam is a good idea to make sure no other problems are  starting.  If you are between ages 65 and 70, and you have had normal Pap tests going back 10 years, you no longer need Pap tests. Even if you no longer need a Pap test, a regular exam is a good idea to make sure no other problems are starting.  If you have had past treatment for cervical cancer or a condition that could lead to cancer, you need Pap tests and screening for cancer for at least 20 years after your treatment.  If Pap tests have been discontinued, risk factors (such as a new sexual partner) need to be reassessed to determine if screening should be resumed.  The HPV test is an additional test that may be used for cervical cancer screening. The HPV test looks for the virus that can cause the cell changes on the cervix. The cells collected during the Pap test can be tested for HPV. The HPV test could be used to screen women aged 30 years and older, and should   be used in women of any age who have unclear Pap test results. After the age of 30, women should have HPV testing at the same frequency as a Pap test.  Colorectal cancer can be detected and often prevented. Most routine colorectal cancer screening begins at the age of 50 and continues through age 75. However, your caregiver may recommend screening at an earlier age if you have risk factors for colon cancer. On a yearly basis, your caregiver may provide home test kits to check for hidden blood in the stool. Use of a small camera at the end of a tube, to directly examine the colon (sigmoidoscopy or colonoscopy), can detect the earliest forms of colorectal cancer. Talk to your caregiver about this at age 50, when routine screening begins. Direct examination of the colon should be repeated every 5 to 10 years through age 75, unless early forms of pre-cancerous polyps or small growths are found.  Hepatitis C blood testing is recommended for all people born from 1945 through 1965 and any individual with known risks for hepatitis C.  Practice  safe sex. Use condoms and avoid high-risk sexual practices to reduce the spread of sexually transmitted infections (STIs). STIs include gonorrhea, chlamydia, syphilis, trichomonas, herpes, HPV, and human immunodeficiency virus (HIV). Herpes, HIV, and HPV are viral illnesses that have no cure. They can result in disability, cancer, and death. Sexually active women aged 25 and younger should be checked for chlamydia. Older women with new or multiple partners should also be tested for chlamydia. Testing for other STIs is recommended if you are sexually active and at increased risk.  Osteoporosis is a disease in which the bones lose minerals and strength with aging. This can result in serious bone fractures. The risk of osteoporosis can be identified using a bone density scan. Women ages 65 and over and women at risk for fractures or osteoporosis should discuss screening with their caregivers. Ask your caregiver whether you should take a calcium supplement or vitamin D to reduce the rate of osteoporosis.  Menopause can be associated with physical symptoms and risks. Hormone replacement therapy is available to decrease symptoms and risks. You should talk to your caregiver about whether hormone replacement therapy is right for you.  Use sunscreen with sun protection factor (SPF) of 30 or more. Apply sunscreen liberally and repeatedly throughout the day. You should seek shade when your shadow is shorter than you. Protect yourself by wearing long sleeves, pants, a wide-brimmed hat, and sunglasses year round, whenever you are outdoors.  Once a month, do a whole body skin exam, using a mirror to look at the skin on your back. Notify your caregiver of new moles, moles that have irregular borders, moles that are larger than a pencil eraser, or moles that have changed in shape or color.  Stay current with required immunizations.  Influenza. You need a dose every fall (or winter). The composition of the flu vaccine  changes each year, so being vaccinated once is not enough.  Pneumococcal polysaccharide. You need 1 to 2 doses if you smoke cigarettes or if you have certain chronic medical conditions. You need 1 dose at age 65 (or older) if you have never been vaccinated.  Tetanus, diphtheria, pertussis (Tdap, Td). Get 1 dose of Tdap vaccine if you are younger than age 65, are over 65 and have contact with an infant, are a healthcare worker, are pregnant, or simply want to be protected from whooping cough. After that, you need a Td   booster dose every 10 years. Consult your caregiver if you have not had at least 3 tetanus and diphtheria-containing shots sometime in your life or have a deep or dirty wound.  HPV. You need this vaccine if you are a woman age 26 or younger. The vaccine is given in 3 doses over 6 months.  Measles, mumps, rubella (MMR). You need at least 1 dose of MMR if you were born in 1957 or later. You may also need a second dose.  Meningococcal. If you are age 19 to 21 and a first-year college student living in a residence hall, or have one of several medical conditions, you need to get vaccinated against meningococcal disease. You may also need additional booster doses.  Zoster (shingles). If you are age 60 or older, you should get this vaccine.  Varicella (chickenpox). If you have never had chickenpox or you were vaccinated but received only 1 dose, talk to your caregiver to find out if you need this vaccine.  Hepatitis A. You need this vaccine if you have a specific risk factor for hepatitis A virus infection or you simply wish to be protected from this disease. The vaccine is usually given as 2 doses, 6 to 18 months apart.  Hepatitis B. You need this vaccine if you have a specific risk factor for hepatitis B virus infection or you simply wish to be protected from this disease. The vaccine is given in 3 doses, usually over 6 months. Preventive Services / Frequency Ages 19 to 39  Blood  pressure check.** / Every 1 to 2 years.  Lipid and cholesterol check.** / Every 5 years beginning at age 20.  Clinical breast exam.** / Every 3 years for women in their 20s and 30s.  Pap test.** / Every 2 years from ages 21 through 29. Every 3 years starting at age 30 through age 65 or 70 with a history of 3 consecutive normal Pap tests.  HPV screening.** / Every 3 years from ages 30 through ages 65 to 70 with a history of 3 consecutive normal Pap tests.  Hepatitis C blood test.** / For any individual with known risks for hepatitis C.  Skin self-exam. / Monthly.  Influenza immunization.** / Every year.  Pneumococcal polysaccharide immunization.** / 1 to 2 doses if you smoke cigarettes or if you have certain chronic medical conditions.  Tetanus, diphtheria, pertussis (Tdap, Td) immunization. / A one-time dose of Tdap vaccine. After that, you need a Td booster dose every 10 years.  HPV immunization. / 3 doses over 6 months, if you are 26 and younger.  Measles, mumps, rubella (MMR) immunization. / You need at least 1 dose of MMR if you were born in 1957 or later. You may also need a second dose.  Meningococcal immunization. / 1 dose if you are age 19 to 21 and a first-year college student living in a residence hall, or have one of several medical conditions, you need to get vaccinated against meningococcal disease. You may also need additional booster doses.  Varicella immunization.** / Consult your caregiver.  Hepatitis A immunization.** / Consult your caregiver. 2 doses, 6 to 18 months apart.  Hepatitis B immunization.** / Consult your caregiver. 3 doses usually over 6 months. Ages 40 to 64  Blood pressure check.** / Every 1 to 2 years.  Lipid and cholesterol check.** / Every 5 years beginning at age 20.  Clinical breast exam.** / Every year after age 40.  Mammogram.** / Every year beginning at age 40   and continuing for as long as you are in good health. Consult with your  caregiver.  Pap test.** / Every 3 years starting at age 30 through age 65 or 70 with a history of 3 consecutive normal Pap tests.  HPV screening.** / Every 3 years from ages 30 through ages 65 to 70 with a history of 3 consecutive normal Pap tests.  Fecal occult blood test (FOBT) of stool. / Every year beginning at age 50 and continuing until age 75. You may not need to do this test if you get a colonoscopy every 10 years.  Flexible sigmoidoscopy or colonoscopy.** / Every 5 years for a flexible sigmoidoscopy or every 10 years for a colonoscopy beginning at age 50 and continuing until age 75.  Hepatitis C blood test.** / For all people born from 1945 through 1965 and any individual with known risks for hepatitis C.  Skin self-exam. / Monthly.  Influenza immunization.** / Every year.  Pneumococcal polysaccharide immunization.** / 1 to 2 doses if you smoke cigarettes or if you have certain chronic medical conditions.  Tetanus, diphtheria, pertussis (Tdap, Td) immunization.** / A one-time dose of Tdap vaccine. After that, you need a Td booster dose every 10 years.  Measles, mumps, rubella (MMR) immunization. / You need at least 1 dose of MMR if you were born in 1957 or later. You may also need a second dose.  Varicella immunization.** / Consult your caregiver.  Meningococcal immunization.** / Consult your caregiver.  Hepatitis A immunization.** / Consult your caregiver. 2 doses, 6 to 18 months apart.  Hepatitis B immunization.** / Consult your caregiver. 3 doses, usually over 6 months. Ages 65 and over  Blood pressure check.** / Every 1 to 2 years.  Lipid and cholesterol check.** / Every 5 years beginning at age 20.  Clinical breast exam.** / Every year after age 40.  Mammogram.** / Every year beginning at age 40 and continuing for as long as you are in good health. Consult with your caregiver.  Pap test.** / Every 3 years starting at age 30 through age 65 or 70 with a 3  consecutive normal Pap tests. Testing can be stopped between 65 and 70 with 3 consecutive normal Pap tests and no abnormal Pap or HPV tests in the past 10 years.  HPV screening.** / Every 3 years from ages 30 through ages 65 or 70 with a history of 3 consecutive normal Pap tests. Testing can be stopped between 65 and 70 with 3 consecutive normal Pap tests and no abnormal Pap or HPV tests in the past 10 years.  Fecal occult blood test (FOBT) of stool. / Every year beginning at age 50 and continuing until age 75. You may not need to do this test if you get a colonoscopy every 10 years.  Flexible sigmoidoscopy or colonoscopy.** / Every 5 years for a flexible sigmoidoscopy or every 10 years for a colonoscopy beginning at age 50 and continuing until age 75.  Hepatitis C blood test.** / For all people born from 1945 through 1965 and any individual with known risks for hepatitis C.  Osteoporosis screening.** / A one-time screening for women ages 65 and over and women at risk for fractures or osteoporosis.  Skin self-exam. / Monthly.  Influenza immunization.** / Every year.  Pneumococcal polysaccharide immunization.** / 1 dose at age 65 (or older) if you have never been vaccinated.  Tetanus, diphtheria, pertussis (Tdap, Td) immunization. / A one-time dose of Tdap vaccine if you are over   65 and have contact with an infant, are a healthcare worker, or simply want to be protected from whooping cough. After that, you need a Td booster dose every 10 years.  Varicella immunization.** / Consult your caregiver.  Meningococcal immunization.** / Consult your caregiver.  Hepatitis A immunization.** / Consult your caregiver. 2 doses, 6 to 18 months apart.  Hepatitis B immunization.** / Check with your caregiver. 3 doses, usually over 6 months. ** Family history and personal history of risk and conditions may change your caregiver's recommendations. Document Released: 07/26/2001 Document Revised: 08/22/2011  Document Reviewed: 10/25/2010 [employer] Patient Information 2013 [employer]  

## 2012-07-12 NOTE — Assessment & Plan Note (Signed)
Stable Cont meds 

## 2012-07-12 NOTE — Progress Notes (Signed)
Subjective:    Cassidy Brown is a 79 y.o. female who presents for Medicare Annual/Subsequent preventive examination.  Preventive Screening-Counseling & Management  Tobacco History  Smoking status  . Never Smoker   Smokeless tobacco  . Not on file     Problems Prior to Visit 1. arthritis  Current Problems (verified) Patient Active Problem List  Diagnosis  . HYPERTENSION  . DIVERTICULOSIS, COLON  . OSTEOPOROSIS  . SCOLIOSIS  . DIZZINESS OR VERTIGO  . BREAST CANCER, HX OF  . POLIOMYELITIS, HX OF  . BACK PAIN, CHRONIC    Medications Prior to Visit Current Outpatient Prescriptions on File Prior to Visit  Medication Sig Dispense Refill  . atenolol (TENORMIN) 50 MG tablet Take 1.5 tablets (75 mg total) by mouth daily.  135 tablet  3  . Bilberry, Vaccinium myrtillus, (BILBERRY PO) Take 1 capsule by mouth daily.      . calcium citrate-vitamin D (CITRACAL+D) 315-200 MG-UNIT per tablet Take 1 tablet by mouth daily.      . cholecalciferol (VITAMIN D) 1000 UNITS tablet Take 1,000 Units by mouth daily.        . Ginkgo Biloba (GINKGO PO) Take 1 tablet by mouth daily.      Marland Kitchen glucosamine-chondroitin 500-400 MG tablet Take 1 tablet by mouth 3 (three) times daily.      Marland Kitchen loratadine (CLARITIN) 10 MG tablet Take 10 mg by mouth daily.        Marland Kitchen LORazepam (ATIVAN) 1 MG tablet Take 1 tablet (1 mg total) by mouth every 8 (eight) hours.  60 tablet  1  . MAGNESIUM PO Take 50 mg by mouth daily.      . Multiple Vitamin (MULTIVITAMIN WITH MINERALS) TABS Take 1 tablet by mouth daily.      . multivitamin-lutein (OCUVITE-LUTEIN) CAPS Take 1 capsule by mouth daily.      . pseudoephedrine (SUDAFED) 30 MG tablet Take 30 mg by mouth as needed.        . terazosin (HYTRIN) 5 MG capsule Take 1 capsule (5 mg total) by mouth at bedtime.  90 capsule  3  . thiamine (VITAMIN B-1) 100 MG tablet Take 100 mg by mouth daily.        Current Medications (verified) Current Outpatient Prescriptions  Medication Sig  Dispense Refill  . atenolol (TENORMIN) 50 MG tablet Take 1.5 tablets (75 mg total) by mouth daily.  135 tablet  3  . Bilberry, Vaccinium myrtillus, (BILBERRY PO) Take 1 capsule by mouth daily.      . calcium citrate-vitamin D (CITRACAL+D) 315-200 MG-UNIT per tablet Take 1 tablet by mouth daily.      . cholecalciferol (VITAMIN D) 1000 UNITS tablet Take 1,000 Units by mouth daily.        . Ginkgo Biloba (GINKGO PO) Take 1 tablet by mouth daily.      Marland Kitchen glucosamine-chondroitin 500-400 MG tablet Take 1 tablet by mouth 3 (three) times daily.      Marland Kitchen loratadine (CLARITIN) 10 MG tablet Take 10 mg by mouth daily.        Marland Kitchen LORazepam (ATIVAN) 1 MG tablet Take 1 tablet (1 mg total) by mouth every 8 (eight) hours.  60 tablet  1  . MAGNESIUM PO Take 50 mg by mouth daily.      . Multiple Vitamin (MULTIVITAMIN WITH MINERALS) TABS Take 1 tablet by mouth daily.      . multivitamin-lutein (OCUVITE-LUTEIN) CAPS Take 1 capsule by mouth daily.      Marland Kitchen  pseudoephedrine (SUDAFED) 30 MG tablet Take 30 mg by mouth as needed.        . terazosin (HYTRIN) 5 MG capsule Take 1 capsule (5 mg total) by mouth at bedtime.  90 capsule  3  . thiamine (VITAMIN B-1) 100 MG tablet Take 100 mg by mouth daily.      . meloxicam (MOBIC) 15 MG tablet Take 1 tablet (15 mg total) by mouth daily.  30 tablet  3     Allergies (verified) Hydrocodone and Propoxyphene-acetaminophen   PAST HISTORY  Family History Family History  Problem Relation Age of Onset  . Coronary artery disease Other     P uncles  . Hypertension Father   . Kidney disease Father   . Hypertension Sister     Social History History  Substance Use Topics  . Smoking status: Never Smoker   . Smokeless tobacco: Not on file  . Alcohol Use: No     Are there smokers in your home (other than you)? No  Risk Factors Current exercise habits: Gym/ health club routine includes cardio.  Dietary issues discussed: na   Cardiac risk factors: advanced age (older than 17 for  men, 74 for women), hypertension and sedentary lifestyle.  Depression Screen (Note: if answer to either of the following is "Yes", a more complete depression screening is indicated)   Over the past two weeks, have you felt down, depressed or hopeless? No  Over the past two weeks, have you felt little interest or pleasure in doing things? No  Have you lost interest or pleasure in daily life? No  Do you often feel hopeless? No  Do you cry easily over simple problems? No  Activities of Daily Living In your present state of health, do you have any difficulty performing the following activities?:  Driving? Yes Managing money?  No Feeding yourself? No Getting from bed to chair? No-- uses walker Climbing a flight of stairs? Yes Preparing food and eating?: No Bathing or showering? No Getting dressed: No Getting to the toilet? No Using the toilet:No Moving around from place to place: Yes In the past year have you fallen or had a near fall?:Yes   Are you sexually active?  No  Do you have more than one partner?  No  Hearing Difficulties: No Do you often ask people to speak up or repeat themselves? No Do you experience ringing or noises in your ears? No Do you have difficulty understanding soft or whispered voices? No   Do you feel that you have a problem with memory? No  Do you often misplace items? No  Do you feel safe at home?  No  Cognitive Testing  Alert? Yes  Normal Appearance?Yes  Oriented to person? Yes  Place? Yes   Time? Yes  Recall of three objects?  Yes  Can perform simple calculations? Yes  Displays appropriate judgment?Yes  Can read the correct time from a watch face?Yes   Advanced Directives have been discussed with the patient? Yes  List the Names of Other Physician/Practitioners you currently use: 1.  White-- gyn 2 opth--[facility] 3/ dentist-- Cristie Hem Indicate any recent Medical Services you may have received from other than [facility] providers in the past year  (date may be approximate).  Immunization History  Administered Date(s) Administered  . Influenza Split 04/27/2011, 03/27/2012  . Influenza Whole 05/03/2007, 03/07/2008, 03/11/2009, 03/16/2010, 04/27/2010  . Pneumococcal Polysaccharide 03/11/2009    Screening Tests Health Maintenance  Topic Date Due  . Tetanus/tdap  02/04/1949  .  Colonoscopy  04/27/2011  . Mammogram  10/27/2012  . Influenza Vaccine  02/11/2013  . Pneumococcal Polysaccharide Vaccine Age 25 And Over  Completed  . Zostavax  Completed    All answers were reviewed with the patient and necessary referrals were made:  Loreen Freud, DO   07/12/2012   History reviewed:  She  has a past medical history of Diverticulosis; Hypertension; Osteoporosis; Cancer; Vertigo; Poliomyelitis; and Allergy. She  does not have any pertinent problems on file. She  has past surgical history that includes Breast surgery; Lumbar laminectomy; Dilation and curettage of uterus; ganglion cyst r wrist; and Spine surgery (11/2010). Her family history includes Coronary artery disease in her other; Hypertension in her father and sister; and Kidney disease in her father. She  reports that she has never smoked. She does not have any smokeless tobacco history on file. She reports that she does not drink alcohol or use illicit drugs. She has a current medication list which includes the following prescription(s): atenolol, bilberry (vaccinium myrtillus), calcium citrate-vitamin d, cholecalciferol, ginkgo biloba, glucosamine-chondroitin, loratadine, lorazepam, magnesium, multivitamin with minerals, multivitamin-lutein, pseudoephedrine, terazosin, thiamine, and meloxicam. Current Outpatient Prescriptions on File Prior to Visit  Medication Sig Dispense Refill  . atenolol (TENORMIN) 50 MG tablet Take 1.5 tablets (75 mg total) by mouth daily.  135 tablet  3  . Bilberry, Vaccinium myrtillus, (BILBERRY PO) Take 1 capsule by mouth daily.      . calcium citrate-vitamin D  (CITRACAL+D) 315-200 MG-UNIT per tablet Take 1 tablet by mouth daily.      . cholecalciferol (VITAMIN D) 1000 UNITS tablet Take 1,000 Units by mouth daily.        . Ginkgo Biloba (GINKGO PO) Take 1 tablet by mouth daily.      Marland Kitchen glucosamine-chondroitin 500-400 MG tablet Take 1 tablet by mouth 3 (three) times daily.      Marland Kitchen loratadine (CLARITIN) 10 MG tablet Take 10 mg by mouth daily.        Marland Kitchen LORazepam (ATIVAN) 1 MG tablet Take 1 tablet (1 mg total) by mouth every 8 (eight) hours.  60 tablet  1  . MAGNESIUM PO Take 50 mg by mouth daily.      . Multiple Vitamin (MULTIVITAMIN WITH MINERALS) TABS Take 1 tablet by mouth daily.      . multivitamin-lutein (OCUVITE-LUTEIN) CAPS Take 1 capsule by mouth daily.      . pseudoephedrine (SUDAFED) 30 MG tablet Take 30 mg by mouth as needed.        . terazosin (HYTRIN) 5 MG capsule Take 1 capsule (5 mg total) by mouth at bedtime.  90 capsule  3  . thiamine (VITAMIN B-1) 100 MG tablet Take 100 mg by mouth daily.       She is allergic to hydrocodone and propoxyphene-acetaminophen.  Review of Systems  Review of Systems  Constitutional: Negative for activity change, appetite change and fatigue.  HENT: Negative for hearing loss, congestion, tinnitus and ear discharge.   Eyes: Negative for visual disturbance (see optho q1y -- vision corrected to 20/20 with glasses).  Respiratory: Negative for cough, chest tightness and shortness of breath.   Cardiovascular: Negative for chest pain, palpitations and leg swelling.  Gastrointestinal: Negative for abdominal pain, diarrhea, constipation and abdominal distention.  Genitourinary: Negative for urgency, frequency, decreased urine volume and difficulty urinating.  Musculoskeletal: + arthritis pain,  Walks with walker Skin: Negative for color change, pallor and rash.  Neurological: Negative for dizziness, light-headedness, numbness and headaches.  Hematological: Negative  for adenopathy. Does not bruise/bleed easily.   Psychiatric/Behavioral: Negative for suicidal ideas, confusion, sleep disturbance, self-injury, dysphoric mood, decreased concentration and agitation.  Pt is able to read and write and can do all ADLs No risk for falling No abuse/ violence in home     Objective:     Vision by Snellen chart: opth  Body mass index is 23.12 kg/(m^2). BP 174/62  Pulse 56  Temp 98.3 F (36.8 C) (Oral)  Ht 5\' 2"  (1.575 m)  Wt 126 lb 6.4 oz (57.335 kg)  BMI 23.12 kg/m2  SpO2 98%  BP 174/62  Pulse 56  Temp 98.3 F (36.8 C) (Oral)  Ht 5\' 2"  (1.575 m)  Wt 126 lb 6.4 oz (57.335 kg)  BMI 23.12 kg/m2  SpO2 98% General appearance: alert, cooperative, appears stated age and no distress Head: Normocephalic, without obvious abnormality, atraumatic Eyes: negative findings: lids and lashes normal, conjunctivae and sclerae normal and pupils equal, round, reactive to light and accomodation Ears: normal TM's and external ear canals both ears Nose: Nares normal. Septum midline. Mucosa normal. No drainage or sinus tenderness. Throat: lips, mucosa, and tongue normal; teeth and gums normal Neck: no adenopathy, no carotid bruit, no JVD, supple, symmetrical, trachea midline and thyroid not enlarged, symmetric, no tenderness/mass/nodules Back: + kyphosis Lungs: clear to auscultation bilaterally Breasts: gyn Heart: S1, S2 normal Abdomen: soft, non-tender; bowel sounds normal; no masses,  no organomegaly Pelvic: deferred--gyn Extremities: extremities normal, atraumatic, no cyanosis or edema Pulses: 2+ and symmetric Skin: Skin color, texture, turgor normal. No rashes or lesions Lymph nodes: Cervical, supraclavicular, and axillary nodes normal. Neurologic: Grossly normal Psych-- no depression, anxiety     Assessment:     cpe     Plan:     During the course of the visit the patient was educated and counseled about appropriate screening and preventive services including:    Pneumococcal vaccine    Influenza vaccine  Td vaccine  Screening mammography  Bone densitometry screening  Colorectal cancer screening  Advanced directives: has an advanced directive - a copy HAS NOT been provided.  Diet review for nutrition referral? Yes ____  Not Indicated ___x_   Patient Instructions (the written plan) was given to the patient.  Medicare Attestation I have personally reviewed: The patient's medical and social history Their use of alcohol, tobacco or illicit drugs Their current medications and supplements The patient's functional ability including ADLs,fall risks, home safety risks, cognitive, and hearing and visual impairment Diet and physical activities Evidence for depression or mood disorders  The patient's weight, height, BMI, and visual acuity have been recorded in the chart.  I have made referrals, counseling, and provided education to the patient based on review of the above and I have provided the patient with a written personalized care plan for preventive services.     Loreen Freud, DO   07/12/2012

## 2012-07-14 LAB — URINE CULTURE: Colony Count: >=100000

## 2012-07-20 ENCOUNTER — Telehealth: Payer: Self-pay

## 2012-07-20 NOTE — Telephone Encounter (Signed)
Spoke with patient she stated that she is allergic to Amox, it causes Diarrhea. Please advise     KP

## 2012-07-20 NOTE — Telephone Encounter (Signed)
Message copied by Arnette Norris on Fri Jul 20, 2012  6:28 PM ------      Message from: Lelon Perla      Created: Sat Jul 14, 2012 12:26 PM       Pt did not complain about uti symptoms.  If she has symptoms---amoxicillin 875 mg 1 po bid for 10 days

## 2012-07-21 NOTE — Telephone Encounter (Signed)
If she has no symptoms she does not need to take anything.-----  amox is what it is sensitive to.   omnicef 300mg  1 po q12h for 5 days

## 2012-07-23 MED ORDER — CEFDINIR 300 MG PO CAPS: 300.0000 mg | ORAL_CAPSULE | Freq: Two times a day (BID) | ORAL | Status: DC

## 2012-07-23 NOTE — Telephone Encounter (Signed)
Discussed with patient and she voice understanding. Rx faxed     KP

## 2012-07-30 ENCOUNTER — Encounter: Payer: Self-pay | Admitting: Family Medicine

## 2012-08-08 ENCOUNTER — Encounter: Payer: Self-pay | Admitting: Family Medicine

## 2012-08-15 ENCOUNTER — Telehealth: Payer: Self-pay | Admitting: Family Medicine

## 2012-08-15 NOTE — Telephone Encounter (Signed)
pt stated she is past due for Reclast - please advise cb# [phone]

## 2012-08-22 ENCOUNTER — Other Ambulatory Visit (INDEPENDENT_AMBULATORY_CARE_PROVIDER_SITE_OTHER): Payer: Medicare Other

## 2012-08-22 DIAGNOSIS — M810 Age-related osteoporosis without current pathological fracture: Secondary | ICD-10-CM

## 2012-08-22 LAB — BASIC METABOLIC PANEL
BUN: 16 mg/dL (ref 6–23)
Calcium: 8.8 mg/dL (ref 8.4–10.5)
Creatinine, Ser: 0.9 mg/dL (ref 0.4–1.2)
GFR: 67.39 mL/min (ref >60.00)
Glucose, Bld: 121 mg/dL — ABNORMAL HIGH (ref 70–99)
Potassium: 3.6 mEq/L (ref 3.5–5.1)

## 2012-08-22 NOTE — Telephone Encounter (Signed)
Benefits Verified: Per Ayesha Rumpf at [employer]// patient is responsible for 20% of the co-payment insurance for the procedure. No PA need for this procedure, No deductible for the plan, out of pocket 4900 max deductible. Only met 32.90.  Ref # [id number].        Please schedule this patient for a BMP; Orders are in the system--    KP

## 2012-08-22 NOTE — Telephone Encounter (Signed)
Patient is on her way now.

## 2012-09-13 ENCOUNTER — Telehealth: Payer: Self-pay | Admitting: Family Medicine

## 2012-09-13 NOTE — Telephone Encounter (Signed)
Patient called regarding status of reclast injection. She usually goes to [facility].

## 2012-09-13 NOTE — Telephone Encounter (Signed)
Discussed with patient and she normally has the procedure done at [facility].  I made her aware and had her call them to get the infusion scheduled, she will call me back with apt date     KP

## 2012-09-17 ENCOUNTER — Telehealth: Payer: Self-pay | Admitting: Family Medicine

## 2012-09-17 NOTE — Telephone Encounter (Signed)
Refill x1 

## 2012-09-17 NOTE — Telephone Encounter (Signed)
Last seen 07/12/12 and filled 10/24/11 #60. Please advise     KP

## 2012-09-17 NOTE — Telephone Encounter (Signed)
Refill: Meclizine 25 mg tablet. Take 1 tablet by mouth 3 times daily as needed. Qty 60. Last fill 10-24-11

## 2012-09-18 MED ORDER — MECLIZINE HCL 25 MG PO TABS: 25.0000 mg | ORAL_TABLET | Freq: Three times a day (TID) | ORAL | Status: DC | PRN

## 2012-09-18 NOTE — Telephone Encounter (Signed)
Rx sent 

## 2012-10-03 NOTE — Telephone Encounter (Signed)
Reclast done at [facility] on 10/02/12.      KP

## 2013-04-09 ENCOUNTER — Ambulatory Visit (INDEPENDENT_AMBULATORY_CARE_PROVIDER_SITE_OTHER): Payer: Medicare Other | Admitting: General Practice

## 2013-04-09 DIAGNOSIS — Z23 Encounter for immunization: Secondary | ICD-10-CM

## 2013-07-23 ENCOUNTER — Other Ambulatory Visit: Payer: Self-pay

## 2013-07-23 DIAGNOSIS — I10 Essential (primary) hypertension: Secondary | ICD-10-CM

## 2013-07-23 LAB — HM MAMMOGRAPHY: HM Mammogram: NEGATIVE

## 2013-07-23 MED ORDER — ATENOLOL 50 MG PO TABS: 75.0000 mg | ORAL_TABLET | Freq: Every day | ORAL | Status: DC

## 2013-07-23 MED ORDER — TERAZOSIN HCL 10 MG PO CAPS: 10.0000 mg | ORAL_CAPSULE | Freq: Every day | ORAL | Status: DC

## 2013-08-16 ENCOUNTER — Telehealth: Payer: Self-pay

## 2013-08-16 NOTE — Telephone Encounter (Signed)
Medication and allergies:  Reviewed and updated  90 day supply/mail order:  [place], [place] - [address] Local pharmacy:  [id number] - [place], [place]   Immunizations due:  Tetanus/Tdap   A/P: No changes to personal, family history or past surgical hx PAP- no longer receive CCS- never had one done, unable to have procedure after attempts with adult and pediatric scopes.   MMG- 06/21/12-negative BD- 05/29/12-osteoporosis Flu- 04/09/13 Tdap-DUE PNA- 03/11/09 Shingles- 04/26/10  To Discuss with Provider: Reclast infusion appointment.  "Droopy eyelids", difficult to watch TV

## 2013-08-16 NOTE — Telephone Encounter (Signed)
I have to verify her benefits first.     KP

## 2013-08-16 NOTE — Telephone Encounter (Signed)
Client is inquiring about next Reclast infusion.  States she needs date for next infusion.  Please advise.

## 2013-08-20 ENCOUNTER — Ambulatory Visit (INDEPENDENT_AMBULATORY_CARE_PROVIDER_SITE_OTHER): Payer: Medicare Other | Admitting: Family Medicine

## 2013-08-20 ENCOUNTER — Encounter: Payer: Self-pay | Admitting: Family Medicine

## 2013-08-20 VITALS — BP 128/80 | HR 52 | Temp 98.3°F | Ht 59.0 in | Wt 124.2 lb

## 2013-08-20 DIAGNOSIS — R7309 Other abnormal glucose: Secondary | ICD-10-CM

## 2013-08-20 DIAGNOSIS — G4700 Insomnia, unspecified: Secondary | ICD-10-CM

## 2013-08-20 DIAGNOSIS — R42 Dizziness and giddiness: Secondary | ICD-10-CM

## 2013-08-20 DIAGNOSIS — I10 Essential (primary) hypertension: Secondary | ICD-10-CM

## 2013-08-20 DIAGNOSIS — Z0000 Encounter for general adult medical examination without abnormal findings: Secondary | ICD-10-CM

## 2013-08-20 DIAGNOSIS — H02409 Unspecified ptosis of unspecified eyelid: Secondary | ICD-10-CM

## 2013-08-20 DIAGNOSIS — R739 Hyperglycemia, unspecified: Secondary | ICD-10-CM

## 2013-08-20 LAB — CBC WITH DIFFERENTIAL/PLATELET
BASOS ABS: 0.1 10*3/uL (ref 0.0–0.1)
Basophils Relative: 1.2 % (ref 0.0–3.0)
EOS ABS: 0.1 10*3/uL (ref 0.0–0.7)
Eosinophils Relative: 2.6 % (ref 0.0–5.0)
HEMATOCRIT: 36.8 % (ref 36.0–46.0)
HEMOGLOBIN: 12.2 g/dL (ref 12.0–15.0)
LYMPHS ABS: 0.9 10*3/uL (ref 0.7–4.0)
Lymphocytes Relative: 17.5 % (ref 12.0–46.0)
MCHC: 33.3 g/dL (ref 30.0–36.0)
MCV: 89.8 fl (ref 78.0–100.0)
MONOS PCT: 7.2 % (ref 3.0–12.0)
Monocytes Absolute: 0.4 10*3/uL (ref 0.1–1.0)
NEUTROS ABS: 3.7 10*3/uL (ref 1.4–7.7)
Neutrophils Relative %: 71.5 % (ref 43.0–77.0)
Platelets: 257 10*3/uL (ref 150.0–400.0)
RBC: 4.09 Mil/uL (ref 3.87–5.11)
RDW: 13.9 % (ref 11.5–14.6)
WBC: 5.2 10*3/uL (ref 4.5–10.5)

## 2013-08-20 LAB — HEPATIC FUNCTION PANEL
ALBUMIN: 3.7 g/dL (ref 3.5–5.2)
ALT: 14 U/L (ref 0–35)
AST: 23 U/L (ref 0–37)
Alkaline Phosphatase: 61 U/L (ref 39–117)
Bilirubin, Direct: 0 mg/dL (ref 0.0–0.3)
Total Bilirubin: 0.4 mg/dL (ref 0.3–1.2)
Total Protein: 6.4 g/dL (ref 6.0–8.3)

## 2013-08-20 LAB — LIPID PANEL
CHOLESTEROL: 158 mg/dL (ref 0–200)
HDL: 70.7 mg/dL (ref >39.00)
LDL Cholesterol: 70 mg/dL (ref 0–99)
Total CHOL/HDL Ratio: 2
Triglycerides: 89 mg/dL (ref 0.0–149.0)
VLDL: 17.8 mg/dL (ref 0.0–40.0)

## 2013-08-20 LAB — POCT URINALYSIS DIPSTICK
BILIRUBIN UA: NEGATIVE
Blood, UA: NEGATIVE
Glucose, UA: NEGATIVE
KETONES UA: NEGATIVE
Leukocytes, UA: NEGATIVE
Nitrite, UA: NEGATIVE
PROTEIN UA: NEGATIVE
SPEC GRAV UA: 1.01
Urobilinogen, UA: 0.2
pH, UA: 8

## 2013-08-20 LAB — BASIC METABOLIC PANEL
BUN: 15 mg/dL (ref 6–23)
CHLORIDE: 104 meq/L (ref 96–112)
CO2: 25 meq/L (ref 19–32)
Calcium: 8.8 mg/dL (ref 8.4–10.5)
Creatinine, Ser: 0.7 mg/dL (ref 0.4–1.2)
GFR: 92.85 mL/min (ref >60.00)
GLUCOSE: 94 mg/dL (ref 70–99)
POTASSIUM: 3.8 meq/L (ref 3.5–5.1)
SODIUM: 138 meq/L (ref 135–145)

## 2013-08-20 LAB — HEMOGLOBIN A1C: HEMOGLOBIN A1C: 5.5 % (ref 4.6–6.5)

## 2013-08-20 MED ORDER — TERAZOSIN HCL 10 MG PO CAPS: 10.0000 mg | ORAL_CAPSULE | Freq: Every day | ORAL | Status: DC

## 2013-08-20 MED ORDER — LORAZEPAM 1 MG PO TABS: ORAL_TABLET | ORAL | Status: DC

## 2013-08-20 MED ORDER — MECLIZINE HCL 25 MG PO TABS: 25.0000 mg | ORAL_TABLET | Freq: Three times a day (TID) | ORAL | Status: DC | PRN

## 2013-08-20 MED ORDER — ATENOLOL 50 MG PO TABS: 75.0000 mg | ORAL_TABLET | Freq: Every day | ORAL | Status: DC

## 2013-08-20 MED ORDER — MELOXICAM 15 MG PO TABS: 15.0000 mg | ORAL_TABLET | ORAL | Status: DC | PRN

## 2013-08-20 NOTE — Patient Instructions (Signed)
Preventive Care for Adults, Female A healthy lifestyle and preventive care can promote health and wellness. Preventive health guidelines for women include the following key practices.  A routine yearly physical is a good way to check with your health care provider about your health and preventive screening. It is a chance to share any concerns and updates on your health and to receive a thorough exam.  Visit your dentist for a routine exam and preventive care every 6 months. Brush your teeth twice a day and floss once a day. Good oral hygiene prevents tooth decay and gum disease.  The frequency of eye exams is based on your age, health, family medical history, use of contact lenses, and other factors. Follow your health care provider's recommendations for frequency of eye exams.  Eat a healthy diet. Foods like vegetables, fruits, whole grains, low-fat dairy products, and lean protein foods contain the nutrients you need without too many calories. Decrease your intake of foods high in solid fats, added sugars, and salt. Eat the right amount of calories for you.Get information about a proper diet from your health care provider, if necessary.  Regular physical exercise is one of the most important things you can do for your health. Most adults should get at least 150 minutes of moderate-intensity exercise (any activity that increases your heart rate and causes you to sweat) each week. In addition, most adults need muscle-strengthening exercises on 2 or more days a week.  Maintain a healthy weight. The body mass index (BMI) is a screening tool to identify possible weight problems. It provides an estimate of body fat based on height and weight. Your health care provider can find your BMI, and can help you achieve or maintain a healthy weight.For adults 20 years and older:  A BMI below 18.5 is considered underweight.  A BMI of 18.5 to 24.9 is normal.  A BMI of 25 to 29.9 is considered overweight.  A  BMI of 30 and above is considered obese.  Maintain normal blood lipids and cholesterol levels by exercising and minimizing your intake of saturated fat. Eat a balanced diet with plenty of fruit and vegetables. Blood tests for lipids and cholesterol should begin at age 62 and be repeated every 5 years. If your lipid or cholesterol levels are high, you are over 50, or you are at high risk for heart disease, you may need your cholesterol levels checked more frequently.Ongoing high lipid and cholesterol levels should be treated with medicines if diet and exercise are not working.  If you smoke, find out from your health care provider how to quit. If you do not use tobacco, do not start.  Lung cancer screening is recommended for adults aged 36 80 years who are at high risk for developing lung cancer because of a history of smoking. A yearly low-dose CT scan of the lungs is recommended for people who have at least a 30-pack-year history of smoking and are a current smoker or have quit within the past 15 years. A pack year of smoking is smoking an average of 1 pack of cigarettes a day for 1 year (for example: 1 pack a day for 30 years or 2 packs a day for 15 years). Yearly screening should continue until the smoker has stopped smoking for at least 15 years. Yearly screening should be stopped for people who develop a health problem that would prevent them from having lung cancer treatment.  If you are pregnant, do not drink alcohol. If you  are breastfeeding, be very cautious about drinking alcohol. If you are not pregnant and choose to drink alcohol, do not have more than 1 drink per day. One drink is considered to be 12 ounces (355 mL) of beer, 5 ounces (148 mL) of wine, or 1.5 ounces (44 mL) of liquor.  Avoid use of street drugs. Do not share needles with anyone. Ask for help if you need support or instructions about stopping the use of drugs.  High blood pressure causes heart disease and increases the risk  of stroke. Your blood pressure should be checked at least every 1 to 2 years. Ongoing high blood pressure should be treated with medicines if weight loss and exercise do not work.  If you are 39 78 years old, ask your health care provider if you should take aspirin to prevent strokes.  Diabetes screening involves taking a blood sample to check your fasting blood sugar level. This should be done once every 3 years, after age 56, if you are within normal weight and without risk factors for diabetes. Testing should be considered at a younger age or be carried out more frequently if you are overweight and have at least 1 risk factor for diabetes.  Breast cancer screening is essential preventive care for women. You should practice "breast self-awareness." This means understanding the normal appearance and feel of your breasts and may include breast self-examination. Any changes detected, no matter how small, should be reported to a health care provider. Women in their 40s and 30s should have a clinical breast exam (CBE) by a health care provider as part of a regular health exam every 1 to 3 years. After age 28, women should have a CBE every year. Starting at age 72, women should consider having a mammogram (breast X-ray test) every year. Women who have a family history of breast cancer should talk to their health care provider about genetic screening. Women at a high risk of breast cancer should talk to their health care providers about having an MRI and a mammogram every year.  Breast cancer gene (BRCA)-related cancer risk assessment is recommended for women who have family members with BRCA-related cancers. BRCA-related cancers include breast, ovarian, tubal, and peritoneal cancers. Having family members with these cancers may be associated with an increased risk for harmful changes (mutations) in the breast cancer genes BRCA1 and BRCA2. Results of the assessment will determine the need for genetic counseling  and BRCA1 and BRCA2 testing.  The Pap test is a screening test for cervical cancer. A Pap test can show cell changes on the cervix that might become cervical cancer if left untreated. A Pap test is a procedure in which cells are obtained and examined from the lower end of the uterus (cervix).  Women should have a Pap test starting at age 59.  Between ages 42 and 13, Pap tests should be repeated every 2 years.  Beginning at age 53, you should have a Pap test every 3 years as long as the past 3 Pap tests have been normal.  Some women have medical problems that increase the chance of getting cervical cancer. Talk to your health care provider about these problems. It is especially important to talk to your health care provider if a new problem develops soon after your last Pap test. In these cases, your health care provider may recommend more frequent screening and Pap tests.  The above recommendations are the same for women who have or have not gotten the vaccine  for human papillomavirus (HPV).  If you had a hysterectomy for a problem that was not cancer or a condition that could lead to cancer, then you no longer need Pap tests. Even if you no longer need a Pap test, a regular exam is a good idea to make sure no other problems are starting.  If you are between ages 71 and 27 years, and you have had normal Pap tests going back 10 years, you no longer need Pap tests. Even if you no longer need a Pap test, a regular exam is a good idea to make sure no other problems are starting.  If you have had past treatment for cervical cancer or a condition that could lead to cancer, you need Pap tests and screening for cancer for at least 20 years after your treatment.  If Pap tests have been discontinued, risk factors (such as a new sexual partner) need to be reassessed to determine if screening should be resumed.  The HPV test is an additional test that may be used for cervical cancer screening. The HPV test  looks for the virus that can cause the cell changes on the cervix. The cells collected during the Pap test can be tested for HPV. The HPV test could be used to screen women aged 65 years and older, and should be used in women of any age who have unclear Pap test results. After the age of 53, women should have HPV testing at the same frequency as a Pap test.  Colorectal cancer can be detected and often prevented. Most routine colorectal cancer screening begins at the age of 95 years and continues through age 26 years. However, your health care provider may recommend screening at an earlier age if you have risk factors for colon cancer. On a yearly basis, your health care provider may provide home test kits to check for hidden blood in the stool. Use of a small camera at the end of a tube, to directly examine the colon (sigmoidoscopy or colonoscopy), can detect the earliest forms of colorectal cancer. Talk to your health care provider about this at age 73, when routine screening begins. Direct exam of the colon should be repeated every 5 10 years through age 77 years, unless early forms of pre-cancerous polyps or small growths are found.  People who are at an increased risk for hepatitis B should be screened for this virus. You are considered at high risk for hepatitis B if:  You were born in a country where hepatitis B occurs often. Talk with your health care provider about which countries are considered high risk.  Your parents were born in a high-risk country and you have not received a shot to protect against hepatitis B (hepatitis B vaccine).  You have HIV or AIDS.  You use needles to inject street drugs.  You live with, or have sex with, someone who has Hepatitis B.  You get hemodialysis treatment.  You take certain medicines for conditions like cancer, organ transplantation, and autoimmune conditions.  Hepatitis C blood testing is recommended for all people born from [birth year] through 1965 and  any individual with known risks for hepatitis C.  Practice safe sex. Use condoms and avoid high-risk sexual practices to reduce the spread of sexually transmitted infections (STIs). STIs include gonorrhea, chlamydia, syphilis, trichomonas, herpes, HPV, and human immunodeficiency virus (HIV). Herpes, HIV, and HPV are viral illnesses that have no cure. They can result in disability, cancer, and death. Sexually active women aged 37  years and younger should be checked for chlamydia. Older women with new or multiple partners should also be tested for chlamydia. Testing for other STIs is recommended if you are sexually active and at increased risk.  Osteoporosis is a disease in which the bones lose minerals and strength with aging. This can result in serious bone fractures or breaks. The risk of osteoporosis can be identified using a bone density scan. Women ages 75 years and over and women at risk for fractures or osteoporosis should discuss screening with their health care providers. Ask your health care provider whether you should take a calcium supplement or vitamin D to reduce the rate of osteoporosis.  Menopause can be associated with physical symptoms and risks. Hormone replacement therapy is available to decrease symptoms and risks. You should talk to your health care provider about whether hormone replacement therapy is right for you.  Use sunscreen. Apply sunscreen liberally and repeatedly throughout the day. You should seek shade when your shadow is shorter than you. Protect yourself by wearing long sleeves, pants, a wide-brimmed hat, and sunglasses year round, whenever you are outdoors.  Once a month, do a whole body skin exam, using a mirror to look at the skin on your back. Tell your health care provider of new moles, moles that have irregular borders, moles that are larger than a pencil eraser, or moles that have changed in shape or color.  Stay current with required vaccines  (immunizations).  Influenza vaccine. All adults should be immunized every year.  Tetanus, diphtheria, and acellular pertussis (Td, Tdap) vaccine. Pregnant women should receive 1 dose of Tdap vaccine during each pregnancy. The dose should be obtained regardless of the length of time since the last dose. Immunization is preferred during the 27th 36th week of gestation. An adult who has not previously received Tdap or who does not know her vaccine status should receive 1 dose of Tdap. This initial dose should be followed by tetanus and diphtheria toxoids (Td) booster doses every 10 years. Adults with an unknown or incomplete history of completing a 3-dose immunization series with Td-containing vaccines should begin or complete a primary immunization series including a Tdap dose. Adults should receive a Td booster every 10 years.  Varicella vaccine. An adult without evidence of immunity to varicella should receive 2 doses or a second dose if she has previously received 1 dose. Pregnant females who do not have evidence of immunity should receive the first dose after pregnancy. This first dose should be obtained before leaving the health care facility. The second dose should be obtained 4 8 weeks after the first dose.  Human papillomavirus (HPV) vaccine. Females aged 3 26 years who have not received the vaccine previously should obtain the 3-dose series. The vaccine is not recommended for use in pregnant females. However, pregnancy testing is not needed before receiving a dose. If a female is found to be pregnant after receiving a dose, no treatment is needed. In that case, the remaining doses should be delayed until after the pregnancy. Immunization is recommended for any person with an immunocompromised condition through the age of 34 years if she did not get any or all doses earlier. During the 3-dose series, the second dose should be obtained 4 8 weeks after the first dose. The third dose should be obtained  24 weeks after the first dose and 16 weeks after the second dose.  Zoster vaccine. One dose is recommended for adults aged 41 years or older unless certain  conditions are present.  Measles, mumps, and rubella (MMR) vaccine. Adults born before [birth year] generally are considered immune to measles and mumps. Adults born in [birth year] or later should have 1 or more doses of MMR vaccine unless there is a contraindication to the vaccine or there is laboratory evidence of immunity to each of the three diseases. A routine second dose of MMR vaccine should be obtained at least 28 days after the first dose for students attending postsecondary schools, health care workers, or international travelers. People who received inactivated measles vaccine or an unknown type of measles vaccine during 1963 1967 should receive 2 doses of MMR vaccine. People who received inactivated mumps vaccine or an unknown type of mumps vaccine before 1979 and are at high risk for mumps infection should consider immunization with 2 doses of MMR vaccine. For females of childbearing age, rubella immunity should be determined. If there is no evidence of immunity, females who are not pregnant should be vaccinated. If there is no evidence of immunity, females who are pregnant should delay immunization until after pregnancy. Unvaccinated health care workers born before [birth year] who lack laboratory evidence of measles, mumps, or rubella immunity or laboratory confirmation of disease should consider measles and mumps immunization with 2 doses of MMR vaccine or rubella immunization with 1 dose of MMR vaccine.  Pneumococcal 13-valent conjugate (PCV13) vaccine. When indicated, a person who is uncertain of her immunization history and has no record of immunization should receive the PCV13 vaccine. An adult aged 42 years or older who has certain medical conditions and has not been previously immunized should receive 1 dose of PCV13 vaccine. This PCV13 should be followed  with a dose of pneumococcal polysaccharide (PPSV23) vaccine. The PPSV23 vaccine dose should be obtained at least 8 weeks after the dose of PCV13 vaccine. An adult aged 4 years or older who has certain medical conditions and previously received 1 or more doses of PPSV23 vaccine should receive 1 dose of PCV13. The PCV13 vaccine dose should be obtained 1 or more years after the last PPSV23 vaccine dose.  Pneumococcal polysaccharide (PPSV23) vaccine. When PCV13 is also indicated, PCV13 should be obtained first. All adults aged 27 years and older should be immunized. An adult younger than age 33 years who has certain medical conditions should be immunized. Any person who resides in a nursing home or long-term care facility should be immunized. An adult smoker should be immunized. People with an immunocompromised condition and certain other conditions should receive both PCV13 and PPSV23 vaccines. People with human immunodeficiency virus (HIV) infection should be immunized as soon as possible after diagnosis. Immunization during chemotherapy or radiation therapy should be avoided. Routine use of PPSV23 vaccine is not recommended for American Indians, [place] Natives, or people younger than 65 years unless there are medical conditions that require PPSV23 vaccine. When indicated, people who have unknown immunization and have no record of immunization should receive PPSV23 vaccine. One-time revaccination 5 years after the first dose of PPSV23 is recommended for people aged 13 64 years who have chronic kidney failure, nephrotic syndrome, asplenia, or immunocompromised conditions. People who received 1 2 doses of PPSV23 before age 66 years should receive another dose of PPSV23 vaccine at age 27 years or later if at least 5 years have passed since the previous dose. Doses of PPSV23 are not needed for people immunized with PPSV23 at or after age 33 years.  Meningococcal vaccine. Adults with asplenia or persistent complement  component deficiencies should receive 2  doses of quadrivalent meningococcal conjugate (MenACWY-D) vaccine. The doses should be obtained at least 2 months apart. Microbiologists working with certain meningococcal bacteria, [place] recruits, people at risk during an outbreak, and people who travel to or live in countries with a high rate of meningitis should be immunized. A first-year college student up through age 18 years who is living in a residence hall should receive a dose if she did not receive a dose on or after her 16th birthday. Adults who have certain high-risk conditions should receive one or more doses of vaccine.  Hepatitis A vaccine. Adults who wish to be protected from this disease, have certain high-risk conditions, work with hepatitis A-infected animals, work in hepatitis A research labs, or travel to or work in countries with a high rate of hepatitis A should be immunized. Adults who were previously unvaccinated and who anticipate close contact with an international adoptee during the first 60 days after arrival in the [place] from a country with a high rate of hepatitis A should be immunized.  Hepatitis B vaccine. Adults who wish to be protected from this disease, have certain high-risk conditions, may be exposed to blood or other infectious body fluids, are household contacts or sex partners of hepatitis B positive people, are clients or workers in certain care facilities, or travel to or work in countries with a high rate of hepatitis B should be immunized.  Haemophilus influenzae type b (Hib) vaccine. A previously unvaccinated person with asplenia or sickle cell disease or having a scheduled splenectomy should receive 1 dose of Hib vaccine. Regardless of previous immunization, a recipient of a hematopoietic stem cell transplant should receive a 3-dose series 6 12 months after her successful transplant. Hib vaccine is not recommended for adults with HIV infection. Preventive  Services / Frequency Ages 102 to 39years  Blood pressure check.** / Every 1 to 2 years.  Lipid and cholesterol check.** / Every 5 years beginning at age 10.  Clinical breast exam.** / Every 3 years for women in their 19s and 18s.  BRCA-related cancer risk assessment.** / For women who have family members with a BRCA-related cancer (breast, ovarian, tubal, or peritoneal cancers).  Pap test.** / Every 2 years from ages 52 through 41. Every 3 years starting at age 32 through age 58 or 75 with a history of 3 consecutive normal Pap tests.  HPV screening.** / Every 3 years from ages 63 through ages 71 to 60 with a history of 3 consecutive normal Pap tests.  Hepatitis C blood test.** / For any individual with known risks for hepatitis C.  Skin self-exam. / Monthly.  Influenza vaccine. / Every year.  Tetanus, diphtheria, and acellular pertussis (Tdap, Td) vaccine.** / Consult your health care provider. Pregnant women should receive 1 dose of Tdap vaccine during each pregnancy. 1 dose of Td every 10 years.  Varicella vaccine.** / Consult your health care provider. Pregnant females who do not have evidence of immunity should receive the first dose after pregnancy.  HPV vaccine. / 3 doses over 6 months, if 88 and younger. The vaccine is not recommended for use in pregnant females. However, pregnancy testing is not needed before receiving a dose.  Measles, mumps, rubella (MMR) vaccine.** / You need at least 1 dose of MMR if you were born in 1957 or later. You may also need a 2nd dose. For females of childbearing age, rubella immunity should be determined. If there is no evidence of immunity, females who are not  pregnant should be vaccinated. If there is no evidence of immunity, females who are pregnant should delay immunization until after pregnancy.  Pneumococcal 13-valent conjugate (PCV13) vaccine.** / Consult your health care provider.  Pneumococcal polysaccharide (PPSV23) vaccine.** / 1 to 2  doses if you smoke cigarettes or if you have certain conditions.  Meningococcal vaccine.** / 1 dose if you are age 88 to 6 years and a Market researcher living in a residence hall, or have one of several medical conditions, you need to get vaccinated against meningococcal disease. You may also need additional booster doses.  Hepatitis A vaccine.** / Consult your health care provider.  Hepatitis B vaccine.** / Consult your health care provider.  Haemophilus influenzae type b (Hib) vaccine.** / Consult your health care provider. Ages 23 to 64years  Blood pressure check.** / Every 1 to 2 years.  Lipid and cholesterol check.** / Every 5 years beginning at age 20 years.  Lung cancer screening. / Every year if you are aged 51 80 years and have a 30-pack-year history of smoking and currently smoke or have quit within the past 15 years. Yearly screening is stopped once you have quit smoking for at least 15 years or develop a health problem that would prevent you from having lung cancer treatment.  Clinical breast exam.** / Every year after age 8 years.  BRCA-related cancer risk assessment.** / For women who have family members with a BRCA-related cancer (breast, ovarian, tubal, or peritoneal cancers).  Mammogram.** / Every year beginning at age 10 years and continuing for as long as you are in good health. Consult with your health care provider.  Pap test.** / Every 3 years starting at age 30 years through age 5 or 61 years with a history of 3 consecutive normal Pap tests.  HPV screening.** / Every 3 years from ages 39 years through ages 72 to 19 years with a history of 3 consecutive normal Pap tests.  Fecal occult blood test (FOBT) of stool. / Every year beginning at age 59 years and continuing until age 27 years. You may not need to do this test if you get a colonoscopy every 10 years.  Flexible sigmoidoscopy or colonoscopy.** / Every 5 years for a flexible sigmoidoscopy or every  10 years for a colonoscopy beginning at age 110 years and continuing until age 63 years.  Hepatitis C blood test.** / For all people born from [birth year] through 1963 and any individual with known risks for hepatitis C.  Skin self-exam. / Monthly.  Influenza vaccine. / Every year.  Tetanus, diphtheria, and acellular pertussis (Tdap/Td) vaccine.** / Consult your health care provider. Pregnant women should receive 1 dose of Tdap vaccine during each pregnancy. 1 dose of Td every 10 years.  Varicella vaccine.** / Consult your health care provider. Pregnant females who do not have evidence of immunity should receive the first dose after pregnancy.  Zoster vaccine.** / 1 dose for adults aged 46 years or older.  Measles, mumps, rubella (MMR) vaccine.** / You need at least 1 dose of MMR if you were born in 1955 or later. You may also need a 2nd dose. For females of childbearing age, rubella immunity should be determined. If there is no evidence of immunity, females who are not pregnant should be vaccinated. If there is no evidence of immunity, females who are pregnant should delay immunization until after pregnancy.  Pneumococcal 13-valent conjugate (PCV13) vaccine.** / Consult your health care provider.  Pneumococcal polysaccharide (PPSV23) vaccine.** / 1  to 2 doses if you smoke cigarettes or if you have certain conditions.  Meningococcal vaccine.** / Consult your health care provider.  Hepatitis A vaccine.** / Consult your health care provider.  Hepatitis B vaccine.** / Consult your health care provider.  Haemophilus influenzae type b (Hib) vaccine.** / Consult your health care provider. Ages 1 years and over  Blood pressure check.** / Every 1 to 2 years.  Lipid and cholesterol check.** / Every 5 years beginning at age 57 years.  Lung cancer screening. / Every year if you are aged 26 80 years and have a 30-pack-year history of smoking and currently smoke or have quit within the past 15 years.  Yearly screening is stopped once you have quit smoking for at least 15 years or develop a health problem that would prevent you from having lung cancer treatment.  Clinical breast exam.** / Every year after age 65 years.  BRCA-related cancer risk assessment.** / For women who have family members with a BRCA-related cancer (breast, ovarian, tubal, or peritoneal cancers).  Mammogram.** / Every year beginning at age 36 years and continuing for as long as you are in good health. Consult with your health care provider.  Pap test.** / Every 3 years starting at age 4 years through age 50 or 75 years with 3 consecutive normal Pap tests. Testing can be stopped between 65 and 70 years with 3 consecutive normal Pap tests and no abnormal Pap or HPV tests in the past 10 years.  HPV screening.** / Every 3 years from ages 4 years through ages 31 or 71 years with a history of 3 consecutive normal Pap tests. Testing can be stopped between 65 and 70 years with 3 consecutive normal Pap tests and no abnormal Pap or HPV tests in the past 10 years.  Fecal occult blood test (FOBT) of stool. / Every year beginning at age 33 years and continuing until age 73 years. You may not need to do this test if you get a colonoscopy every 10 years.  Flexible sigmoidoscopy or colonoscopy.** / Every 5 years for a flexible sigmoidoscopy or every 10 years for a colonoscopy beginning at age 97 years and continuing until age 48 years.  Hepatitis C blood test.** / For all people born from [birth year] through 1963 and any individual with known risks for hepatitis C.  Osteoporosis screening.** / A one-time screening for women ages 32 years and over and women at risk for fractures or osteoporosis.  Skin self-exam. / Monthly.  Influenza vaccine. / Every year.  Tetanus, diphtheria, and acellular pertussis (Tdap/Td) vaccine.** / 1 dose of Td every 10 years.  Varicella vaccine.** / Consult your health care provider.  Zoster vaccine.** / 1  dose for adults aged 45 years or older.  Pneumococcal 13-valent conjugate (PCV13) vaccine.** / Consult your health care provider.  Pneumococcal polysaccharide (PPSV23) vaccine.** / 1 dose for all adults aged 13 years and older.  Meningococcal vaccine.** / Consult your health care provider.  Hepatitis A vaccine.** / Consult your health care provider.  Hepatitis B vaccine.** / Consult your health care provider.  Haemophilus influenzae type b (Hib) vaccine.** / Consult your health care provider. ** Family history and personal history of risk and conditions may change your health care provider's recommendations. Document Released: 07/26/2001 Document Revised: 03/20/2013 Document Reviewed: 10/25/2010 [facility] Patient Information 2014 [place], [place].

## 2013-08-20 NOTE — Progress Notes (Signed)
Pre visit review using our clinic review tool, if applicable. No additional management support is needed unless otherwise documented below in the visit note. 

## 2013-08-20 NOTE — Progress Notes (Signed)
Subjective:    Cassidy Brown is a 80 y.o. female who presents for Medicare Annual/Subsequent preventive examination.  Preventive Screening-Counseling & Management  Tobacco History  Smoking status  . Never Smoker   Smokeless tobacco  . Not on file     Problems Prior to Visit 1. Droopy eyelids  Current Problems (verified) Patient Active Problem List   Diagnosis Date Noted  . BACK PAIN, CHRONIC 06/15/2010  . BREAST CANCER, HX OF 03/07/2008  . SCOLIOSIS 11/20/2007  . POLIOMYELITIS, HX OF 11/20/2007  . HYPERTENSION 10/13/2006  . DIVERTICULOSIS, COLON 10/13/2006  . OSTEOPOROSIS 10/13/2006  . DIZZINESS OR VERTIGO 10/13/2006    Medications Prior to Visit Current Outpatient Prescriptions on File Prior to Visit  Medication Sig Dispense Refill  . Bilberry, Vaccinium myrtillus, (BILBERRY PO) Take 1 capsule by mouth daily.      . calcium citrate-vitamin D (CITRACAL+D) 315-200 MG-UNIT per tablet Take 1 tablet by mouth daily.      . cholecalciferol (VITAMIN D) 1000 UNITS tablet Take 1,000 Units by mouth daily.        . Ginkgo Biloba (GINKGO PO) Take 1 tablet by mouth daily.      Marland Kitchen glucosamine-chondroitin 500-400 MG tablet Take 1 tablet by mouth 3 (three) times daily.      Marland Kitchen loratadine (CLARITIN) 10 MG tablet Take 10 mg by mouth daily.        Marland Kitchen MAGNESIUM PO Take 500 mg by mouth daily.       . Misc Natural Products (LUTEIN 20 PO) Take 1 capsule by mouth daily.      . multivitamin-lutein (OCUVITE-LUTEIN) CAPS Take 1 capsule by mouth daily.      . pseudoephedrine (SUDAFED) 30 MG tablet Take 30 mg by mouth as needed.        . thiamine (VITAMIN B-1) 100 MG tablet Take 100 mg by mouth daily.       No current facility-administered medications on file prior to visit.    Current Medications (verified) Current Outpatient Prescriptions  Medication Sig Dispense Refill  . atenolol (TENORMIN) 50 MG tablet Take 1.5 tablets (75 mg total) by mouth daily.  135 tablet  1  . Bilberry, Vaccinium  myrtillus, (BILBERRY PO) Take 1 capsule by mouth daily.      . calcium citrate-vitamin D (CITRACAL+D) 315-200 MG-UNIT per tablet Take 1 tablet by mouth daily.      . cholecalciferol (VITAMIN D) 1000 UNITS tablet Take 1,000 Units by mouth daily.        . Ginkgo Biloba (GINKGO PO) Take 1 tablet by mouth daily.      Marland Kitchen glucosamine-chondroitin 500-400 MG tablet Take 1 tablet by mouth 3 (three) times daily.      Marland Kitchen loratadine (CLARITIN) 10 MG tablet Take 10 mg by mouth daily.        Marland Kitchen LORazepam (ATIVAN) 1 MG tablet 1 po qhs prn sleep  30 tablet  2  . MAGNESIUM PO Take 500 mg by mouth daily.       . meclizine (ANTIVERT) 25 MG tablet Take 1 tablet (25 mg total) by mouth 3 (three) times daily as needed.  60 tablet  0  . meloxicam (MOBIC) 15 MG tablet Take 1 tablet (15 mg total) by mouth as needed.  90 tablet  0  . Misc Natural Products (LUTEIN 20 PO) Take 1 capsule by mouth daily.      . multivitamin-lutein (OCUVITE-LUTEIN) CAPS Take 1 capsule by mouth daily.      . pseudoephedrine (  SUDAFED) 30 MG tablet Take 30 mg by mouth as needed.        . terazosin (HYTRIN) 10 MG capsule Take 1 capsule (10 mg total) by mouth at bedtime.  90 capsule  1  . thiamine (VITAMIN B-1) 100 MG tablet Take 100 mg by mouth daily.       No current facility-administered medications for this visit.     Allergies (verified) Amoxicillin; Hydrocodone; Plendil; Propoxyphene n-acetaminophen; and Tenex   PAST HISTORY  Family History Family History  Problem Relation Age of Onset  . Coronary artery disease Other     P uncles  . Hypertension Father   . Kidney disease Father   . Hypertension Sister     Social History History  Substance Use Topics  . Smoking status: Never Smoker   . Smokeless tobacco: Not on file  . Alcohol Use: No     Are there smokers in your home (other than you)? No  Risk Factors Current exercise habits: The patient does not participate in regular exercise at present.  Dietary issues discussed: na    Cardiac risk factors: advanced age (older than 23 for men, 2 for women), hypertension and sedentary lifestyle.  Depression Screen (Note: if answer to either of the following is "Yes", a more complete depression screening is indicated)   Over the past two weeks, have you felt down, depressed or hopeless? No  Over the past two weeks, have you felt little interest or pleasure in doing things? No  Have you lost interest or pleasure in daily life? No  Do you often feel hopeless? No  Do you cry easily over simple problems? No  Activities of Daily Living In your present state of health, do you have any difficulty performing the following activities?:  Driving? Yes Managing money?  No Feeding yourself? No Getting from bed to chair? No Climbing a flight of stairs? No Preparing food and eating?: No Bathing or showering? No Getting dressed: No Getting to the toilet? No Using the toilet:No Moving around from place to place: No In the past year have you fallen or had a near fall?:No   Are you sexually active?  Yes  Do you have more than one partner?  No  Hearing Difficulties: No Do you often ask people to speak up or repeat themselves? No Do you experience ringing or noises in your ears? No Do you have difficulty understanding soft or whispered voices? No   Do you feel that you have a problem with memory? No  Do you often misplace items? No  Do you feel safe at home?  Yes  Cognitive Testing  Alert? Yes  Normal Appearance?Yes  Oriented to person? Yes  Place? Yes   Time? Yes  Recall of three objects?  Yes  Can perform simple calculations? Yes  Displays appropriate judgment?Yes  Can read the correct time from a watch face?Yes   Advanced Directives have been discussed with the patient? Yes  List the Names of Other Physician/Practitioners you currently use: 1.  Eye-- devanzo 2.  Dentist-  [facility] 3. onc--[place] 4 ns--jenkins Indicate any recent Medical Services you may  have received from other than [facility] providers in the past year (date may be approximate).  Immunization History  Administered Date(s) Administered  . Influenza Split 04/27/2011, 03/27/2012  . Influenza Whole 05/03/2007, 03/07/2008, 03/11/2009, 03/16/2010, 04/27/2010  . Influenza, High Dose Seasonal PF 04/09/2013  . Pneumococcal Polysaccharide-23 03/11/2009    Screening Tests Health Maintenance  Topic Date  Due  . Tetanus/tdap  02/04/1949  . Colonoscopy  04/27/2011  . Influenza Vaccine  01/11/2014  . Mammogram  07/23/2014  . Pneumococcal Polysaccharide Vaccine Age 66 And Over  Completed  . Zostavax  Completed    All answers were reviewed with the patient and necessary referrals were made:  Garnet Koyanagi, DO   08/20/2013   History reviewed:  She  has a past medical history of Diverticulosis; Hypertension; Osteoporosis; Cancer; Vertigo; Poliomyelitis; and Allergy. She  does not have any pertinent problems on file. She  has past surgical history that includes Breast surgery; Lumbar laminectomy; Dilation and curettage of uterus; ganglion cyst r wrist; and Spine surgery (11/2010). Her family history includes Coronary artery disease in her other; Hypertension in her father and sister; Kidney disease in her father. She  reports that she has never smoked. She does not have any smokeless tobacco history on file. She reports that she does not drink alcohol or use illicit drugs. She has a current medication list which includes the following prescription(s): atenolol, bilberry (vaccinium myrtillus), calcium citrate-vitamin d, cholecalciferol, ginkgo biloba, glucosamine-chondroitin, loratadine, lorazepam, magnesium, meclizine, meloxicam, misc natural products, multivitamin-lutein, pseudoephedrine, terazosin, and thiamine. Current Outpatient Prescriptions on File Prior to Visit  Medication Sig Dispense Refill  . Bilberry, Vaccinium myrtillus, (BILBERRY PO) Take 1 capsule by mouth daily.      . calcium  citrate-vitamin D (CITRACAL+D) 315-200 MG-UNIT per tablet Take 1 tablet by mouth daily.      . cholecalciferol (VITAMIN D) 1000 UNITS tablet Take 1,000 Units by mouth daily.        . Ginkgo Biloba (GINKGO PO) Take 1 tablet by mouth daily.      Marland Kitchen glucosamine-chondroitin 500-400 MG tablet Take 1 tablet by mouth 3 (three) times daily.      Marland Kitchen loratadine (CLARITIN) 10 MG tablet Take 10 mg by mouth daily.        Marland Kitchen MAGNESIUM PO Take 500 mg by mouth daily.       . Misc Natural Products (LUTEIN 20 PO) Take 1 capsule by mouth daily.      . multivitamin-lutein (OCUVITE-LUTEIN) CAPS Take 1 capsule by mouth daily.      . pseudoephedrine (SUDAFED) 30 MG tablet Take 30 mg by mouth as needed.        . thiamine (VITAMIN B-1) 100 MG tablet Take 100 mg by mouth daily.       No current facility-administered medications on file prior to visit.   She is allergic to amoxicillin; hydrocodone; plendil; propoxyphene n-acetaminophen; and tenex.  Review of Systems Review of Systems  Constitutional: Negative for activity change, appetite change and fatigue.  HENT: Negative for hearing loss, congestion, tinnitus and ear discharge.  dentist q46m Eyes: Negative for visual disturbance (see optho q1y -- vision corrected to 20/20 with glasses).  Respiratory: Negative for cough, chest tightness and shortness of breath.   Cardiovascular: Negative for chest pain, palpitations and leg swelling.  Gastrointestinal: Negative for abdominal pain, diarrhea, constipation and abdominal distention.  Genitourinary: Negative for urgency, frequency, decreased urine volume and difficulty urinating.  Musculoskeletal: Negative for back pain, arthralgias and gait problem.  Skin: Negative for color change, pallor and rash.  Neurological: Negative for dizziness, light-headedness, numbness and headaches.  Hematological: Negative for adenopathy. Does not bruise/bleed easily.  Psychiatric/Behavioral: Negative for suicidal ideas, confusion, sleep  disturbance, self-injury, dysphoric mood, decreased concentration and agitation.        Objective:     Vision by Snellen chart: opth  Body mass index is 25.07 kg/(m^2). BP 128/80  Pulse 52  Temp(Src) 98.3 F (36.8 C) (Oral)  Ht 4\' 11"  (1.499 m)  Wt 124 lb 3.2 oz (56.337 kg)  BMI 25.07 kg/m2  SpO2 98%  BP 128/80  Pulse 52  Temp(Src) 98.3 F (36.8 C) (Oral)  Ht 4\' 11"  (1.499 m)  Wt 124 lb 3.2 oz (56.337 kg)  BMI 25.07 kg/m2  SpO2 98% General appearance: alert, cooperative, appears stated age and no distress Head: Normocephalic, without obvious abnormality, atraumatic Eyes: conjunctivae/corneas clear. PERRL, EOM's intact. Fundi benign. Ears: normal TM's and external ear canals both ears Nose: Nares normal. Septum midline. Mucosa normal. No drainage or sinus tenderness. Throat: lips, mucosa, and tongue normal; teeth and gums normal Neck: no adenopathy, no carotid bruit, no JVD, supple, symmetrical, trachea midline and thyroid not enlarged, symmetric, no tenderness/mass/nodules Back: symmetric, no curvature. ROM normal. No CVA tenderness. Lungs: clear to auscultation bilaterally Breasts: normal appearance, no masses or tenderness Heart: S1, S2 normal Abdomen: soft, non-tender; bowel sounds normal; no masses,  no organomegaly Pelvic: not indicated; post-menopausal, no abnormal Pap smears in past Extremities: extremities normal, atraumatic, no cyanosis or edema Pulses: 2+ and symmetric Skin: Skin color, texture, turgor normal. No rashes or lesions Lymph nodes: Cervical, supraclavicular, and axillary nodes normal. Neurologic: Alert and oriented X 3, normal strength and tone. Normal symmetric reflexes. Normal coordination and gait Psych--no depression. No anxiety      Assessment:     cpe      Plan:     During the course of the visit the patient was educated and counseled about appropriate screening and preventive services including:    Pneumococcal vaccine    Influenza vaccine  Td vaccine  Screening mammography  Screening Pap smear and pelvic exam   Bone densitometry screening  Colorectal cancer screening  Diabetes screening  Glaucoma screening  Nutrition counseling   Smoking cessation counseling  Advanced directives: has NO advanced directive  - add't info requested. Referral to SW: no  Diet review for nutrition referral? Yes ____  Not Indicated __x_   Patient Instructions (the written plan) was given to the patient.  Medicare Attestation I have personally reviewed: The patient's medical and social history Their use of alcohol, tobacco or illicit drugs Their current medications and supplements The patient's functional ability including ADLs,fall risks, home safety risks, cognitive, and hearing and visual impairment Diet and physical activities Evidence for depression or mood disorders  The patient's weight, height, BMI, and visual acuity have been recorded in the chart.  I have made referrals, counseling, and provided education to the patient based on review of the above and I have provided the patient with a written personalized care plan for preventive services.   1. HTN (hypertension) con't meds - atenolol (TENORMIN) 50 MG tablet; Take 1.5 tablets (75 mg total) by mouth daily.  Dispense: 135 tablet; Refill: 1 - terazosin (HYTRIN) 10 MG capsule; Take 1 capsule (10 mg total) by mouth at bedtime.  Dispense: 90 capsule; Refill: 1 - Basic metabolic panel - CBC with Differential - Hepatic function panel - Lipid panel - POCT urinalysis dipstick  2. Dizzy stable - meclizine (ANTIVERT) 25 MG tablet; Take 1 tablet (25 mg total) by mouth 3 (three) times daily as needed.  Dispense: 60 tablet; Refill: 0  3. Insomnia con't meds--- takes only rarely - LORazepam (ATIVAN) 1 MG tablet; 1 po qhs prn sleep  Dispense: 30 tablet; Refill: 2  4. Ptosis See referral -  Consult to plastic surgery  5. Hyperglycemia Check labs -  Hemoglobin A1c  6. Preventative health care    Garnet Koyanagi, DO   08/20/2013

## 2013-08-21 ENCOUNTER — Telehealth: Payer: Self-pay | Admitting: Family Medicine

## 2013-08-21 NOTE — Telephone Encounter (Signed)
Relevant patient education assigned to patient using Emmi. ° °

## 2013-09-24 LAB — COLOGUARD: Cologuard: NEGATIVE

## 2013-10-03 ENCOUNTER — Telehealth: Payer: Self-pay | Admitting: Family Medicine

## 2013-10-03 DIAGNOSIS — H02409 Unspecified ptosis of unspecified eyelid: Secondary | ICD-10-CM

## 2013-10-03 NOTE — Telephone Encounter (Signed)
Benefits have been verified. Please schedule a BMP.     KP  Ref to General surgeon for Ptosis put in per Dr.Lowne's last office note. It was never put in    [place]

## 2013-10-03 NOTE — Telephone Encounter (Signed)
Patient called and stated that it was time for her reclast infusion. Also she stated that dr Etter Sjogren was going to refer her to an eye surgeon for droopy eye lids during her last office visit. Patient has not heard anything regarding a referral. Please advise.

## 2013-10-03 NOTE — Telephone Encounter (Signed)
Patient is scheduled to come in on 10/04/2012 for her BMP. Thanks

## 2013-10-03 NOTE — Telephone Encounter (Signed)
New order in for Plastic surgeon       KP

## 2013-10-04 ENCOUNTER — Other Ambulatory Visit (INDEPENDENT_AMBULATORY_CARE_PROVIDER_SITE_OTHER): Payer: Medicare Other

## 2013-10-04 DIAGNOSIS — M810 Age-related osteoporosis without current pathological fracture: Secondary | ICD-10-CM

## 2013-10-07 LAB — BASIC METABOLIC PANEL
BUN: 18 mg/dL (ref 6–23)
CHLORIDE: 103 meq/L (ref 96–112)
CO2: 27 meq/L (ref 19–32)
Calcium: 9.2 mg/dL (ref 8.4–10.5)
Creatinine, Ser: 0.7 mg/dL (ref 0.4–1.2)
GFR: 86.64 mL/min (ref >60.00)
Glucose, Bld: 100 mg/dL — ABNORMAL HIGH (ref 70–99)
POTASSIUM: 4 meq/L (ref 3.5–5.1)
SODIUM: 139 meq/L (ref 135–145)

## 2013-10-08 ENCOUNTER — Other Ambulatory Visit: Payer: Self-pay | Admitting: Family Medicine

## 2013-10-08 NOTE — Telephone Encounter (Addendum)
Re-clast benefits and lab results, patient demo's all sent to [facility]. The patient has been made aware    KP

## 2013-10-08 NOTE — Telephone Encounter (Signed)
Sent 08/20/13 #30 with 2

## 2013-10-17 ENCOUNTER — Other Ambulatory Visit: Payer: Self-pay | Admitting: Family Medicine

## 2013-10-17 NOTE — Telephone Encounter (Signed)
Patient is requesting a refill for lorazepam Last office visit 08/20/13 Rx was written for #30 with 2 RF at that [facility] (pt stated she never received it) Called [facility] in [place] to verify she had not filled this, pharmacist confirmed they do not have that script on file.  Contract on file UDS low risk Okay to refill?

## 2013-10-28 ENCOUNTER — Encounter: Payer: Self-pay | Admitting: Family Medicine

## 2014-03-20 ENCOUNTER — Ambulatory Visit (INDEPENDENT_AMBULATORY_CARE_PROVIDER_SITE_OTHER): Payer: Medicare Other

## 2014-03-20 DIAGNOSIS — Z23 Encounter for immunization: Secondary | ICD-10-CM

## 2014-04-10 ENCOUNTER — Other Ambulatory Visit: Payer: Self-pay | Admitting: Family Medicine

## 2014-07-10 ENCOUNTER — Other Ambulatory Visit: Payer: Self-pay | Admitting: Family Medicine

## 2014-08-28 ENCOUNTER — Encounter: Payer: Self-pay | Admitting: Family Medicine

## 2014-09-01 ENCOUNTER — Telehealth: Payer: Self-pay | Admitting: *Deleted

## 2014-09-01 ENCOUNTER — Encounter: Payer: Self-pay | Admitting: *Deleted

## 2014-09-01 NOTE — Telephone Encounter (Signed)
Pre-Visit Call completed with patient and chart updated.   Pre-Visit Info documented in Specialty Comments under [employer].    

## 2014-09-02 ENCOUNTER — Encounter: Payer: Self-pay | Admitting: Family Medicine

## 2014-09-02 ENCOUNTER — Ambulatory Visit (INDEPENDENT_AMBULATORY_CARE_PROVIDER_SITE_OTHER): Payer: Medicare Other | Admitting: Family Medicine

## 2014-09-02 VITALS — BP 130/80 | HR 51 | Temp 98.0°F | Ht <= 58 in | Wt 125.0 lb

## 2014-09-02 DIAGNOSIS — R8290 Unspecified abnormal findings in urine: Secondary | ICD-10-CM | POA: Diagnosis not present

## 2014-09-02 DIAGNOSIS — E785 Hyperlipidemia, unspecified: Secondary | ICD-10-CM

## 2014-09-02 DIAGNOSIS — Z23 Encounter for immunization: Secondary | ICD-10-CM | POA: Diagnosis not present

## 2014-09-02 DIAGNOSIS — E2839 Other primary ovarian failure: Secondary | ICD-10-CM | POA: Diagnosis not present

## 2014-09-02 DIAGNOSIS — Z0000 Encounter for general adult medical examination without abnormal findings: Secondary | ICD-10-CM | POA: Diagnosis not present

## 2014-09-02 DIAGNOSIS — Z1231 Encounter for screening mammogram for malignant neoplasm of breast: Secondary | ICD-10-CM

## 2014-09-02 DIAGNOSIS — I10 Essential (primary) hypertension: Secondary | ICD-10-CM

## 2014-09-02 LAB — BASIC METABOLIC PANEL
BUN: 14 mg/dL (ref 6–23)
CALCIUM: 9 mg/dL (ref 8.4–10.5)
CO2: 31 mEq/L (ref 19–32)
CREATININE: 0.66 mg/dL (ref 0.40–1.20)
Chloride: 102 mEq/L (ref 96–112)
GFR: 91 mL/min (ref >60.00)
Glucose, Bld: 94 mg/dL (ref 70–99)
Potassium: 3.7 mEq/L (ref 3.5–5.1)
Sodium: 136 mEq/L (ref 135–145)

## 2014-09-02 LAB — HEPATIC FUNCTION PANEL
ALT: 15 U/L (ref 0–35)
AST: 23 U/L (ref 0–37)
Albumin: 3.9 g/dL (ref 3.5–5.2)
Alkaline Phosphatase: 54 U/L (ref 39–117)
BILIRUBIN TOTAL: 0.5 mg/dL (ref 0.2–1.2)
Bilirubin, Direct: 0.1 mg/dL (ref 0.0–0.3)
TOTAL PROTEIN: 6.6 g/dL (ref 6.0–8.3)

## 2014-09-02 LAB — CBC WITH DIFFERENTIAL/PLATELET
BASOS ABS: 0 10*3/uL (ref 0.0–0.1)
Basophils Relative: 0.6 % (ref 0.0–3.0)
EOS ABS: 0.1 10*3/uL (ref 0.0–0.7)
Eosinophils Relative: 2.5 % (ref 0.0–5.0)
HCT: 37.6 % (ref 36.0–46.0)
Hemoglobin: 12.6 g/dL (ref 12.0–15.0)
Lymphocytes Relative: 24.2 % (ref 12.0–46.0)
Lymphs Abs: 0.9 10*3/uL (ref 0.7–4.0)
MCHC: 33.6 g/dL (ref 30.0–36.0)
MCV: 88.6 fl (ref 78.0–100.0)
MONO ABS: 0.3 10*3/uL (ref 0.1–1.0)
Monocytes Relative: 8.5 % (ref 3.0–12.0)
NEUTROS PCT: 64.2 % (ref 43.0–77.0)
Neutro Abs: 2.4 10*3/uL (ref 1.4–7.7)
Platelets: 222 10*3/uL (ref 150.0–400.0)
RBC: 4.24 Mil/uL (ref 3.87–5.11)
RDW: 14.1 % (ref 11.5–15.5)
WBC: 3.7 10*3/uL — ABNORMAL LOW (ref 4.0–10.5)

## 2014-09-02 LAB — LIPID PANEL
Cholesterol: 162 mg/dL (ref 0–200)
HDL: 68 mg/dL (ref >39.00)
LDL Cholesterol: 78 mg/dL (ref 0–99)
NONHDL: 94
Total CHOL/HDL Ratio: 2
Triglycerides: 78 mg/dL (ref 0.0–149.0)
VLDL: 15.6 mg/dL (ref 0.0–40.0)

## 2014-09-02 LAB — POCT URINALYSIS DIPSTICK
BILIRUBIN UA: NEGATIVE
Blood, UA: NEGATIVE
GLUCOSE UA: NEGATIVE
Ketones, UA: NEGATIVE
NITRITE UA: NEGATIVE
PROTEIN UA: NEGATIVE
Spec Grav, UA: 1.02
UROBILINOGEN UA: 0.2
pH, UA: 6

## 2014-09-02 LAB — MICROALBUMIN / CREATININE URINE RATIO
Creatinine,U: 52 mg/dL
MICROALB UR: 0.9 mg/dL (ref 0.0–1.9)
MICROALB/CREAT RATIO: 1.7 mg/g (ref 0.0–30.0)

## 2014-09-02 MED ORDER — TERAZOSIN HCL 10 MG PO CAPS: ORAL_CAPSULE | ORAL | Status: DC

## 2014-09-02 MED ORDER — ATENOLOL 50 MG PO TABS: 75.0000 mg | ORAL_TABLET | Freq: Every day | ORAL | Status: DC

## 2014-09-02 MED ORDER — MELOXICAM 15 MG PO TABS: 15.0000 mg | ORAL_TABLET | ORAL | Status: DC | PRN

## 2014-09-02 MED ORDER — PNEUMOCOCCAL 13-VAL CONJ VACC IM SUSP: 0.5000 mL | INTRAMUSCULAR | Status: DC

## 2014-09-02 NOTE — Progress Notes (Signed)
Subjective:    Cassidy Brown is a 81 y.o. female who presents for Medicare Annual/Subsequent preventive examination.  Preventive Screening-Counseling & Management  Tobacco History  Smoking status  . Never Smoker   Smokeless tobacco  . Not on file      Problems Prior to Visit  1. Head and neck pain ---comes and goes--- tylenol helps  Current Problems (verified) Patient Active Problem List   Diagnosis Date Noted  . BACK PAIN, CHRONIC 06/15/2010  . BREAST CANCER, HX OF 03/07/2008  . SCOLIOSIS 11/20/2007  . POLIOMYELITIS, HX OF 11/20/2007  . HYPERTENSION 10/13/2006  . DIVERTICULOSIS, COLON 10/13/2006  . OSTEOPOROSIS 10/13/2006  . DIZZINESS OR VERTIGO 10/13/2006    Medications Prior to Visit Current Outpatient Prescriptions on File Prior to Visit  Medication Sig Dispense Refill  . Bilberry, Vaccinium myrtillus, (BILBERRY PO) Take 1 capsule by mouth daily.    . calcium citrate-vitamin D (CITRACAL+D) 315-200 MG-UNIT per tablet Take 1 tablet by mouth daily.    . cholecalciferol (VITAMIN D) 1000 UNITS tablet Take 1,000 Units by mouth daily.      . Ginkgo Biloba (GINKGO PO) Take 1 tablet by mouth daily.    Marland Kitchen glucosamine-chondroitin 500-400 MG tablet Take 1 tablet by mouth 3 (three) times daily.    Marland Kitchen loratadine (CLARITIN) 10 MG tablet Take 10 mg by mouth daily.      Marland Kitchen LORazepam (ATIVAN) 1 MG tablet TAKE 1 TABLET EVERY 8 HOURS 60 tablet 0  . MAGNESIUM PO Take 500 mg by mouth daily.     . meclizine (ANTIVERT) 25 MG tablet Take 1 tablet (25 mg total) by mouth 3 (three) times daily as needed. 60 tablet 0  . Misc Natural Products (LUTEIN 20 PO) Take 1 capsule by mouth daily.    . multivitamin-lutein (OCUVITE-LUTEIN) CAPS Take 1 capsule by mouth daily.    . pseudoephedrine (SUDAFED) 30 MG tablet Take 30 mg by mouth as needed.      . thiamine (VITAMIN B-1) 100 MG tablet Take 100 mg by mouth daily.     No current facility-administered medications on file prior to visit.    Current  Medications (verified) Current Outpatient Prescriptions  Medication Sig Dispense Refill  . atenolol (TENORMIN) 50 MG tablet Take 1.5 tablets (75 mg total) by mouth daily. Office visit due now 135 tablet 3  . Bilberry, Vaccinium myrtillus, (BILBERRY PO) Take 1 capsule by mouth daily.    . calcium citrate-vitamin D (CITRACAL+D) 315-200 MG-UNIT per tablet Take 1 tablet by mouth daily.    . cholecalciferol (VITAMIN D) 1000 UNITS tablet Take 1,000 Units by mouth daily.      . Ginkgo Biloba (GINKGO PO) Take 1 tablet by mouth daily.    Marland Kitchen glucosamine-chondroitin 500-400 MG tablet Take 1 tablet by mouth 3 (three) times daily.    Marland Kitchen loratadine (CLARITIN) 10 MG tablet Take 10 mg by mouth daily.      Marland Kitchen LORazepam (ATIVAN) 1 MG tablet TAKE 1 TABLET EVERY 8 HOURS 60 tablet 0  . MAGNESIUM PO Take 500 mg by mouth daily.     . meclizine (ANTIVERT) 25 MG tablet Take 1 tablet (25 mg total) by mouth 3 (three) times daily as needed. 60 tablet 0  . meloxicam (MOBIC) 15 MG tablet Take 1 tablet (15 mg total) by mouth as needed. 90 tablet 0  . Misc Natural Products (LUTEIN 20 PO) Take 1 capsule by mouth daily.    . multivitamin-lutein (OCUVITE-LUTEIN) CAPS Take 1 capsule by mouth  daily.    . pseudoephedrine (SUDAFED) 30 MG tablet Take 30 mg by mouth as needed.      . terazosin (HYTRIN) 10 MG capsule Take 1 capsule by mouth at  bedtime 90 capsule 3  . thiamine (VITAMIN B-1) 100 MG tablet Take 100 mg by mouth daily.    . pneumococcal 13-valent conjugate vaccine (PREVNAR 13) SUSP injection Inject 0.5 mLs into the muscle tomorrow at 10 am. 0.5 mL 0   No current facility-administered medications for this visit.     Allergies (verified) Amoxicillin; Hydrocodone; Plendil; Propoxyphene n-acetaminophen; and Tenex   PAST HISTORY  Family History Family History  Problem Relation Age of Onset  . Coronary artery disease Other     P uncles  . Hypertension Father   . Kidney disease Father   . Hypertension Sister      Social History History  Substance Use Topics  . Smoking status: Never Smoker   . Smokeless tobacco: Not on file  . Alcohol Use: No     Are there smokers in your home (other than you)? No  Risk Factors Current exercise habits: The patient does not participate in regular exercise at present.  Dietary issues discussed: na   Cardiac risk factors: hypertension and sedentary lifestyle.  Depression Screen (Note: if answer to either of the following is "Yes", a more complete depression screening is indicated)   Over the past two weeks, have you felt down, depressed or hopeless? No  Over the past two weeks, have you felt little interest or pleasure in doing things? No  Have you lost interest or pleasure in daily life? No  Do you often feel hopeless? No  Do you cry easily over simple problems? No  Activities of Daily Living In your present state of health, do you have any difficulty performing the following activities?:  Driving? No Managing money?  No Feeding yourself? No Getting from bed to chair? No Climbing a flight of stairs? Yes Preparing food and eating?: No Bathing or showering? No Getting dressed: No Getting to the toilet? No Using the toilet:No Moving around from place to place: No In the past year have you fallen or had a near fall?:No   Are you sexually active?  Yes  Do you have more than one partner?  No  Hearing Difficulties: No Do you often ask people to speak up or repeat themselves? No Do you experience ringing or noises in your ears? No Do you have difficulty understanding soft or whispered voices? No   Do you feel that you have a problem with memory? No  Do you often misplace items? No  Do you feel safe at home?  Yes  Cognitive Testing  Alert? Yes  Normal Appearance?Yes  Oriented to person? Yes  Place? Yes   Time? Yes  Recall of three objects?  Yes  Can perform simple calculations? Yes  Displays appropriate judgment?Yes  Can read the correct  time from a watch face?Yes   Advanced Directives have been discussed with the patient? Yes  List the Names of Other Physician/Practitioners you currently use: 1.  Needs new eye dr  Stanton Kidney any recent Medical Services you may have received from other than [facility] providers in the past year (date may be approximate).  Immunization History  Administered Date(s) Administered  . Influenza Split 04/27/2011, 03/27/2012  . Influenza Whole 05/03/2007, 03/07/2008, 03/11/2009, 03/16/2010, 04/27/2010  . Influenza, High Dose Seasonal PF 04/09/2013  . Influenza,inj,Quad PF,36+ Mos 03/20/2014  . Pneumococcal Polysaccharide-23 03/11/2009  Screening Tests Health Maintenance  Topic Date Due  . PNA vac Low Risk Adult (2 of 2 - PCV13) 03/11/2010  . COLONOSCOPY  04/27/2011  . MAMMOGRAM  07/23/2014  . TETANUS/TDAP  09/01/2015 (Originally 02/04/1949)  . INFLUENZA VACCINE  01/12/2015  . DEXA SCAN  Completed  . ZOSTAVAX  Completed    All answers were reviewed with the patient and necessary referrals were made:  Garnet Koyanagi, DO   09/02/2014   History reviewed:  She  has a past medical history of Diverticulosis; Hypertension; Osteoporosis; Cancer; Vertigo; Poliomyelitis; and Allergy. She  does not have any pertinent problems on file. She  has past surgical history that includes Breast surgery; Lumbar laminectomy; Dilation and curettage of uterus; ganglion cyst r wrist; and Spine surgery (11/2010). Her family history includes Coronary artery disease in her other; Hypertension in her father and sister; Kidney disease in her father. She  reports that she has never smoked. She does not have any smokeless tobacco history on file. She reports that she does not drink alcohol or use illicit drugs. She has a current medication list which includes the following prescription(s): atenolol, bilberry (vaccinium myrtillus), calcium citrate-vitamin d, cholecalciferol, ginkgo biloba, glucosamine-chondroitin, loratadine,  lorazepam, magnesium, meclizine, meloxicam, misc natural products, multivitamin-lutein, pseudoephedrine, terazosin, thiamine, and pneumococcal 13-valent conjugate vaccine. Current Outpatient Prescriptions on File Prior to Visit  Medication Sig Dispense Refill  . Bilberry, Vaccinium myrtillus, (BILBERRY PO) Take 1 capsule by mouth daily.    . calcium citrate-vitamin D (CITRACAL+D) 315-200 MG-UNIT per tablet Take 1 tablet by mouth daily.    . cholecalciferol (VITAMIN D) 1000 UNITS tablet Take 1,000 Units by mouth daily.      . Ginkgo Biloba (GINKGO PO) Take 1 tablet by mouth daily.    Marland Kitchen glucosamine-chondroitin 500-400 MG tablet Take 1 tablet by mouth 3 (three) times daily.    Marland Kitchen loratadine (CLARITIN) 10 MG tablet Take 10 mg by mouth daily.      Marland Kitchen LORazepam (ATIVAN) 1 MG tablet TAKE 1 TABLET EVERY 8 HOURS 60 tablet 0  . MAGNESIUM PO Take 500 mg by mouth daily.     . meclizine (ANTIVERT) 25 MG tablet Take 1 tablet (25 mg total) by mouth 3 (three) times daily as needed. 60 tablet 0  . Misc Natural Products (LUTEIN 20 PO) Take 1 capsule by mouth daily.    . multivitamin-lutein (OCUVITE-LUTEIN) CAPS Take 1 capsule by mouth daily.    . pseudoephedrine (SUDAFED) 30 MG tablet Take 30 mg by mouth as needed.      . thiamine (VITAMIN B-1) 100 MG tablet Take 100 mg by mouth daily.     No current facility-administered medications on file prior to visit.   She is allergic to amoxicillin; hydrocodone; plendil; propoxyphene n-acetaminophen; and tenex.  Review of Systems  Review of Systems  Constitutional: Negative for activity change, appetite change and fatigue.  HENT: Negative for hearing loss, congestion, tinnitus and ear discharge.   Eyes: Negative for visual disturbance (opth due)  Respiratory: Negative for cough, chest tightness and shortness of breath.   Cardiovascular: Negative for chest pain, palpitations and leg swelling.  Gastrointestinal: Negative for abdominal pain, diarrhea, constipation and  abdominal distention.  Genitourinary: Negative for urgency, frequency, decreased urine volume and difficulty urinating.  Musculoskeletal: Negative for back pain, arthralgias and gait problem.  Skin: Negative for color change, pallor and rash.  Neurological: Negative for dizziness, light-headedness, numbness and headaches.  Hematological: Negative for adenopathy. Does not bruise/bleed easily.  Psychiatric/Behavioral: Negative  for suicidal ideas, confusion, sleep disturbance, self-injury, dysphoric mood, decreased concentration and agitation.  Pt is able to read and write and can do all ADLs No risk for falling No abuse/ violence in home      Objective:     Vision by Snellen chart: right eye:20/40, left eye:20/200--- with correction  Body mass index is 26.35 kg/(m^2). BP 130/80 mmHg  Pulse 51  Temp(Src) 98 F (36.7 C) (Oral)  Ht 4' 9.75" (1.467 m)  Wt 125 lb (56.7 kg)  BMI 26.35 kg/m2  SpO2 97%  BP 130/80 mmHg  Pulse 51  Temp(Src) 98 F (36.7 C) (Oral)  Ht 4' 9.75" (1.467 m)  Wt 125 lb (56.7 kg)  BMI 26.35 kg/m2  SpO2 97% General appearance: alert, cooperative, appears stated age and no distress Head: Normocephalic, without obvious abnormality, atraumatic Eyes: conjunctivae/corneas clear. PERRL, EOM's intact. Fundi benign. Ears: normal TM's and external ear canals both ears Nose: Nares normal. Septum midline. Mucosa normal. No drainage or sinus tenderness. Throat: lips, mucosa, and tongue normal; teeth and gums normal Neck: no adenopathy, no carotid bruit, no JVD, supple, symmetrical, trachea midline and thyroid not enlarged, symmetric, no tenderness/mass/nodules Back: symmetric, no curvature. ROM normal. No CVA tenderness. Lungs: clear to auscultation bilaterally Breasts: normal appearance, no masses or tenderness Heart: S1, S2 normal Abdomen: soft, non-tender; bowel sounds normal; no masses,  no organomegaly Pelvic: not indicated; post-menopausal, no abnormal Pap  smears in past Extremities: extremities normal, atraumatic, no cyanosis or edema Pulses: 2+ and symmetric Skin: Skin color, texture, turgor normal. No rashes or lesions Lymph nodes: Cervical, supraclavicular, and axillary nodes normal. Neurologic: Alert and oriented X 3, normal strength and tone. Normal symmetric reflexes. Normal coordination and gait Psych-- no depression, no anxiety      Assessment:     cpe      Plan:     During the course of the visit the patient was educated and counseled about appropriate screening and preventive services including:    Pneumococcal vaccine   Influenza vaccine  Td vaccine  Screening mammography  Bone densitometry screening  Colorectal cancer screening  Diabetes screening  Glaucoma screening  Advanced directives: has NO advanced directive - not interested in additional information  Diet review for nutrition referral? Yes ____  Not Indicated __x__   Patient Instructions (the written plan) was given to the patient.  Medicare Attestation I have personally reviewed: The patient's medical and social history Their use of alcohol, tobacco or illicit drugs Their current medications and supplements The patient's functional ability including ADLs,fall risks, home safety risks, cognitive, and hearing and visual impairment Diet and physical activities Evidence for depression or mood disorders  The patient's weight, height, BMI, and visual acuity have been recorded in the chart.  I have made referrals, counseling, and provided education to the patient based on review of the above and I have provided the patient with a written personalized care plan for preventive services.    1. Estrogen deficiency   - DG Bone Density; Future  2. Encounter for screening mammogram for breast cancer   - MM Digital Screening; Future  3. Hyperlipidemia   - Hepatic function panel - Lipid panel  4. Preventative health care    5. Essential  hypertension   - atenolol (TENORMIN) 50 MG tablet; Take 1.5 tablets (75 mg total) by mouth daily. Office visit due now  Dispense: 135 tablet; Refill: 3 - terazosin (HYTRIN) 10 MG capsule; Take 1 capsule by mouth at  bedtime  Dispense: 90 capsule; Refill: 3 - Basic metabolic panel - CBC with Differential/Platelet - Microalbumin / creatinine urine ratio - POCT urinalysis dipstick  6. Need for prophylactic vaccination against Streptococcus pneumoniae (pneumococcus)   - pneumococcal 13-valent conjugate vaccine (PREVNAR 13) SUSP injection; Inject 0.5 mLs into the muscle tomorrow at 10 am.  Dispense: 0.5 mL; Refill: Weston Mills, DO   09/02/2014

## 2014-09-02 NOTE — Patient Instructions (Signed)
Preventive Care for Adults A healthy lifestyle and preventive care can promote health and wellness. Preventive health guidelines for women include the following key practices.  A routine yearly physical is a good way to check with your health care provider about your health and preventive screening. It is a chance to share any concerns and updates on your health and to receive a thorough exam.  Visit your dentist for a routine exam and preventive care every 6 months. Brush your teeth twice a day and floss once a day. Good oral hygiene prevents tooth decay and gum disease.  The frequency of eye exams is based on your age, health, family medical history, use of contact lenses, and other factors. Follow your health care provider's recommendations for frequency of eye exams.  Eat a healthy diet. Foods like vegetables, fruits, whole grains, low-fat dairy products, and lean protein foods contain the nutrients you need without too many calories. Decrease your intake of foods high in solid fats, added sugars, and salt. Eat the right amount of calories for you.Get information about a proper diet from your health care provider, if necessary.  Regular physical exercise is one of the most important things you can do for your health. Most adults should get at least 150 minutes of moderate-intensity exercise (any activity that increases your heart rate and causes you to sweat) each week. In addition, most adults need muscle-strengthening exercises on 2 or more days a week.  Maintain a healthy weight. The body mass index (BMI) is a screening tool to identify possible weight problems. It provides an estimate of body fat based on height and weight. Your health care provider can find your BMI and can help you achieve or maintain a healthy weight.For adults 20 years and older:  A BMI below 18.5 is considered underweight.  A BMI of 18.5 to 24.9 is normal.  A BMI of 25 to 29.9 is considered overweight.  A BMI of  30 and above is considered obese.  Maintain normal blood lipids and cholesterol levels by exercising and minimizing your intake of saturated fat. Eat a balanced diet with plenty of fruit and vegetables. Blood tests for lipids and cholesterol should begin at age 76 and be repeated every 5 years. If your lipid or cholesterol levels are high, you are over 50, or you are at high risk for heart disease, you may need your cholesterol levels checked more frequently.Ongoing high lipid and cholesterol levels should be treated with medicines if diet and exercise are not working.  If you smoke, find out from your health care provider how to quit. If you do not use tobacco, do not start.  Lung cancer screening is recommended for adults aged 22-80 years who are at high risk for developing lung cancer because of a history of smoking. A yearly low-dose CT scan of the lungs is recommended for people who have at least a 30-pack-year history of smoking and are a current smoker or have quit within the past 15 years. A pack year of smoking is smoking an average of 1 pack of cigarettes a day for 1 year (for example: 1 pack a day for 30 years or 2 packs a day for 15 years). Yearly screening should continue until the smoker has stopped smoking for at least 15 years. Yearly screening should be stopped for people who develop a health problem that would prevent them from having lung cancer treatment.  If you are pregnant, do not drink alcohol. If you are breastfeeding,  be very cautious about drinking alcohol. If you are not pregnant and choose to drink alcohol, do not have more than 1 drink per day. One drink is considered to be 12 ounces (355 mL) of beer, 5 ounces (148 mL) of wine, or 1.5 ounces (44 mL) of liquor.  Avoid use of street drugs. Do not share needles with anyone. Ask for help if you need support or instructions about stopping the use of drugs.  High blood pressure causes heart disease and increases the risk of  stroke. Your blood pressure should be checked at least every 1 to 2 years. Ongoing high blood pressure should be treated with medicines if weight loss and exercise do not work.  If you are 75-52 years old, ask your health care provider if you should take aspirin to prevent strokes.  Diabetes screening involves taking a blood sample to check your fasting blood sugar level. This should be done once every 3 years, after age 15, if you are within normal weight and without risk factors for diabetes. Testing should be considered at a younger age or be carried out more frequently if you are overweight and have at least 1 risk factor for diabetes.  Breast cancer screening is essential preventive care for women. You should practice "breast self-awareness." This means understanding the normal appearance and feel of your breasts and may include breast self-examination. Any changes detected, no matter how small, should be reported to a health care provider. Women in their 58s and 30s should have a clinical breast exam (CBE) by a health care provider as part of a regular health exam every 1 to 3 years. After age 16, women should have a CBE every year. Starting at age 53, women should consider having a mammogram (breast X-ray test) every year. Women who have a family history of breast cancer should talk to their health care provider about genetic screening. Women at a high risk of breast cancer should talk to their health care providers about having an MRI and a mammogram every year.  Breast cancer gene (BRCA)-related cancer risk assessment is recommended for women who have family members with BRCA-related cancers. BRCA-related cancers include breast, ovarian, tubal, and peritoneal cancers. Having family members with these cancers may be associated with an increased risk for harmful changes (mutations) in the breast cancer genes BRCA1 and BRCA2. Results of the assessment will determine the need for genetic counseling and  BRCA1 and BRCA2 testing.  Routine pelvic exams to screen for cancer are no longer recommended for nonpregnant women who are considered low risk for cancer of the pelvic organs (ovaries, uterus, and vagina) and who do not have symptoms. Ask your health care provider if a screening pelvic exam is right for you.  If you have had past treatment for cervical cancer or a condition that could lead to cancer, you need Pap tests and screening for cancer for at least 20 years after your treatment. If Pap tests have been discontinued, your risk factors (such as having a new sexual partner) need to be reassessed to determine if screening should be resumed. Some women have medical problems that increase the chance of getting cervical cancer. In these cases, your health care provider may recommend more frequent screening and Pap tests.  The HPV test is an additional test that may be used for cervical cancer screening. The HPV test looks for the virus that can cause the cell changes on the cervix. The cells collected during the Pap test can be  tested for HPV. The HPV test could be used to screen women aged 30 years and older, and should be used in women of any age who have unclear Pap test results. After the age of 30, women should have HPV testing at the same frequency as a Pap test.  Colorectal cancer can be detected and often prevented. Most routine colorectal cancer screening begins at the age of 50 years and continues through age 75 years. However, your health care provider may recommend screening at an earlier age if you have risk factors for colon cancer. On a yearly basis, your health care provider may provide home test kits to check for hidden blood in the stool. Use of a small camera at the end of a tube, to directly examine the colon (sigmoidoscopy or colonoscopy), can detect the earliest forms of colorectal cancer. Talk to your health care provider about this at age 50, when routine screening begins. Direct  exam of the colon should be repeated every 5-10 years through age 75 years, unless early forms of pre-cancerous polyps or small growths are found.  People who are at an increased risk for hepatitis B should be screened for this virus. You are considered at high risk for hepatitis B if:  You were born in a country where hepatitis B occurs often. Talk with your health care provider about which countries are considered high risk.  Your parents were born in a high-risk country and you have not received a shot to protect against hepatitis B (hepatitis B vaccine).  You have HIV or AIDS.  You use needles to inject street drugs.  You live with, or have sex with, someone who has hepatitis B.  You get hemodialysis treatment.  You take certain medicines for conditions like cancer, organ transplantation, and autoimmune conditions.  Hepatitis C blood testing is recommended for all people born from 1945 through 1965 and any individual with known risks for hepatitis C.  Practice safe sex. Use condoms and avoid high-risk sexual practices to reduce the spread of sexually transmitted infections (STIs). STIs include gonorrhea, chlamydia, syphilis, trichomonas, herpes, HPV, and human immunodeficiency virus (HIV). Herpes, HIV, and HPV are viral illnesses that have no cure. They can result in disability, cancer, and death.  You should be screened for sexually transmitted illnesses (STIs) including gonorrhea and chlamydia if:  You are sexually active and are younger than 24 years.  You are older than 24 years and your health care provider tells you that you are at risk for this type of infection.  Your sexual activity has changed since you were last screened and you are at an increased risk for chlamydia or gonorrhea. Ask your health care provider if you are at risk.  If you are at risk of being infected with HIV, it is recommended that you take a prescription medicine daily to prevent HIV infection. This is  called preexposure prophylaxis (PrEP). You are considered at risk if:  You are a heterosexual woman, are sexually active, and are at increased risk for HIV infection.  You take drugs by injection.  You are sexually active with a partner who has HIV.  Talk with your health care provider about whether you are at high risk of being infected with HIV. If you choose to begin PrEP, you should first be tested for HIV. You should then be tested every 3 months for as long as you are taking PrEP.  Osteoporosis is a disease in which the bones lose minerals and strength   with aging. This can result in serious bone fractures or breaks. The risk of osteoporosis can be identified using a bone density scan. Women ages 65 years and over and women at risk for fractures or osteoporosis should discuss screening with their health care providers. Ask your health care provider whether you should take a calcium supplement or vitamin D to reduce the rate of osteoporosis.  Menopause can be associated with physical symptoms and risks. Hormone replacement therapy is available to decrease symptoms and risks. You should talk to your health care provider about whether hormone replacement therapy is right for you.  Use sunscreen. Apply sunscreen liberally and repeatedly throughout the day. You should seek shade when your shadow is shorter than you. Protect yourself by wearing long sleeves, pants, a wide-brimmed hat, and sunglasses year round, whenever you are outdoors.  Once a month, do a whole body skin exam, using a mirror to look at the skin on your back. Tell your health care provider of new moles, moles that have irregular borders, moles that are larger than a pencil eraser, or moles that have changed in shape or color.  Stay current with required vaccines (immunizations).  Influenza vaccine. All adults should be immunized every year.  Tetanus, diphtheria, and acellular pertussis (Td, Tdap) vaccine. Pregnant women should  receive 1 dose of Tdap vaccine during each pregnancy. The dose should be obtained regardless of the length of time since the last dose. Immunization is preferred during the 27th-36th week of gestation. An adult who has not previously received Tdap or who does not know her vaccine status should receive 1 dose of Tdap. This initial dose should be followed by tetanus and diphtheria toxoids (Td) booster doses every 10 years. Adults with an unknown or incomplete history of completing a 3-dose immunization series with Td-containing vaccines should begin or complete a primary immunization series including a Tdap dose. Adults should receive a Td booster every 10 years.  Varicella vaccine. An adult without evidence of immunity to varicella should receive 2 doses or a second dose if she has previously received 1 dose. Pregnant females who do not have evidence of immunity should receive the first dose after pregnancy. This first dose should be obtained before leaving the health care facility. The second dose should be obtained 4-8 weeks after the first dose.  Human papillomavirus (HPV) vaccine. Females aged 13-26 years who have not received the vaccine previously should obtain the 3-dose series. The vaccine is not recommended for use in pregnant females. However, pregnancy testing is not needed before receiving a dose. If a female is found to be pregnant after receiving a dose, no treatment is needed. In that case, the remaining doses should be delayed until after the pregnancy. Immunization is recommended for any person with an immunocompromised condition through the age of 26 years if she did not get any or all doses earlier. During the 3-dose series, the second dose should be obtained 4-8 weeks after the first dose. The third dose should be obtained 24 weeks after the first dose and 16 weeks after the second dose.  Zoster vaccine. One dose is recommended for adults aged 60 years or older unless certain conditions are  present.  Measles, mumps, and rubella (MMR) vaccine. Adults born before 1957 generally are considered immune to measles and mumps. Adults born in 1957 or later should have 1 or more doses of MMR vaccine unless there is a contraindication to the vaccine or there is laboratory evidence of immunity to   each of the three diseases. A routine second dose of MMR vaccine should be obtained at least 28 days after the first dose for students attending postsecondary schools, health care workers, or international travelers. People who received inactivated measles vaccine or an unknown type of measles vaccine during 1963-1967 should receive 2 doses of MMR vaccine. People who received inactivated mumps vaccine or an unknown type of mumps vaccine before 1979 and are at high risk for mumps infection should consider immunization with 2 doses of MMR vaccine. For females of childbearing age, rubella immunity should be determined. If there is no evidence of immunity, females who are not pregnant should be vaccinated. If there is no evidence of immunity, females who are pregnant should delay immunization until after pregnancy. Unvaccinated health care workers born before 1957 who lack laboratory evidence of measles, mumps, or rubella immunity or laboratory confirmation of disease should consider measles and mumps immunization with 2 doses of MMR vaccine or rubella immunization with 1 dose of MMR vaccine.  Pneumococcal 13-valent conjugate (PCV13) vaccine. When indicated, a person who is uncertain of her immunization history and has no record of immunization should receive the PCV13 vaccine. An adult aged 19 years or older who has certain medical conditions and has not been previously immunized should receive 1 dose of PCV13 vaccine. This PCV13 should be followed with a dose of pneumococcal polysaccharide (PPSV23) vaccine. The PPSV23 vaccine dose should be obtained at least 8 weeks after the dose of PCV13 vaccine. An adult aged 19  years or older who has certain medical conditions and previously received 1 or more doses of PPSV23 vaccine should receive 1 dose of PCV13. The PCV13 vaccine dose should be obtained 1 or more years after the last PPSV23 vaccine dose.  Pneumococcal polysaccharide (PPSV23) vaccine. When PCV13 is also indicated, PCV13 should be obtained first. All adults aged 65 years and older should be immunized. An adult younger than age 65 years who has certain medical conditions should be immunized. Any person who resides in a nursing home or long-term care facility should be immunized. An adult smoker should be immunized. People with an immunocompromised condition and certain other conditions should receive both PCV13 and PPSV23 vaccines. People with human immunodeficiency virus (HIV) infection should be immunized as soon as possible after diagnosis. Immunization during chemotherapy or radiation therapy should be avoided. Routine use of PPSV23 vaccine is not recommended for American Indians, [place] Natives, or people younger than 65 years unless there are medical conditions that require PPSV23 vaccine. When indicated, people who have unknown immunization and have no record of immunization should receive PPSV23 vaccine. One-time revaccination 5 years after the first dose of PPSV23 is recommended for people aged 19-64 years who have chronic kidney failure, nephrotic syndrome, asplenia, or immunocompromised conditions. People who received 1-2 doses of PPSV23 before age 65 years should receive another dose of PPSV23 vaccine at age 65 years or later if at least 5 years have passed since the previous dose. Doses of PPSV23 are not needed for people immunized with PPSV23 at or after age 65 years.  Meningococcal vaccine. Adults with asplenia or persistent complement component deficiencies should receive 2 doses of quadrivalent meningococcal conjugate (MenACWY-D) vaccine. The doses should be obtained at least 2 months apart.  Microbiologists working with certain meningococcal bacteria, military recruits, people at risk during an outbreak, and people who travel to or live in countries with a high rate of meningitis should be immunized. A first-year college student up through age   21 years who is living in a residence hall should receive a dose if she did not receive a dose on or after her 16th birthday. Adults who have certain high-risk conditions should receive one or more doses of vaccine.  Hepatitis A vaccine. Adults who wish to be protected from this disease, have certain high-risk conditions, work with hepatitis A-infected animals, work in hepatitis A research labs, or travel to or work in countries with a high rate of hepatitis A should be immunized. Adults who were previously unvaccinated and who anticipate close contact with an international adoptee during the first 60 days after arrival in the [place] from a country with a high rate of hepatitis A should be immunized.  Hepatitis B vaccine. Adults who wish to be protected from this disease, have certain high-risk conditions, may be exposed to blood or other infectious body fluids, are household contacts or sex partners of hepatitis B positive people, are clients or workers in certain care facilities, or travel to or work in countries with a high rate of hepatitis B should be immunized.  Haemophilus influenzae type b (Hib) vaccine. A previously unvaccinated person with asplenia or sickle cell disease or having a scheduled splenectomy should receive 1 dose of Hib vaccine. Regardless of previous immunization, a recipient of a hematopoietic stem cell transplant should receive a 3-dose series 6-12 months after her successful transplant. Hib vaccine is not recommended for adults with HIV infection. Preventive Services / Frequency Ages 64 to 68 years  Blood pressure check.** / Every 1 to 2 years.  Lipid and cholesterol check.** / Every 5 years beginning at age  22.  Clinical breast exam.** / Every 3 years for women in their 88s and 53s.  BRCA-related cancer risk assessment.** / For women who have family members with a BRCA-related cancer (breast, ovarian, tubal, or peritoneal cancers).  Pap test.** / Every 2 years from ages 90 through 51. Every 3 years starting at age 21 through age 56 or 3 with a history of 3 consecutive normal Pap tests.  HPV screening.** / Every 3 years from ages 24 through ages 1 to 46 with a history of 3 consecutive normal Pap tests.  Hepatitis C blood test.** / For any individual with known risks for hepatitis C.  Skin self-exam. / Monthly.  Influenza vaccine. / Every year.  Tetanus, diphtheria, and acellular pertussis (Tdap, Td) vaccine.** / Consult your health care provider. Pregnant women should receive 1 dose of Tdap vaccine during each pregnancy. 1 dose of Td every 10 years.  Varicella vaccine.** / Consult your health care provider. Pregnant females who do not have evidence of immunity should receive the first dose after pregnancy.  HPV vaccine. / 3 doses over 6 months, if 72 and younger. The vaccine is not recommended for use in pregnant females. However, pregnancy testing is not needed before receiving a dose.  Measles, mumps, rubella (MMR) vaccine.** / You need at least 1 dose of MMR if you were born in 1957 or later. You may also need a 2nd dose. For females of childbearing age, rubella immunity should be determined. If there is no evidence of immunity, females who are not pregnant should be vaccinated. If there is no evidence of immunity, females who are pregnant should delay immunization until after pregnancy.  Pneumococcal 13-valent conjugate (PCV13) vaccine.** / Consult your health care provider.  Pneumococcal polysaccharide (PPSV23) vaccine.** / 1 to 2 doses if you smoke cigarettes or if you have certain conditions.  Meningococcal vaccine.** /  1 dose if you are age 19 to 21 years and a first-year college  student living in a residence hall, or have one of several medical conditions, you need to get vaccinated against meningococcal disease. You may also need additional booster doses.  Hepatitis A vaccine.** / Consult your health care provider.  Hepatitis B vaccine.** / Consult your health care provider.  Haemophilus influenzae type b (Hib) vaccine.** / Consult your health care provider. Ages 40 to 64 years  Blood pressure check.** / Every 1 to 2 years.  Lipid and cholesterol check.** / Every 5 years beginning at age 20 years.  Lung cancer screening. / Every year if you are aged 55-80 years and have a 30-pack-year history of smoking and currently smoke or have quit within the past 15 years. Yearly screening is stopped once you have quit smoking for at least 15 years or develop a health problem that would prevent you from having lung cancer treatment.  Clinical breast exam.** / Every year after age 40 years.  BRCA-related cancer risk assessment.** / For women who have family members with a BRCA-related cancer (breast, ovarian, tubal, or peritoneal cancers).  Mammogram.** / Every year beginning at age 40 years and continuing for as long as you are in good health. Consult with your health care provider.  Pap test.** / Every 3 years starting at age 30 years through age 65 or 70 years with a history of 3 consecutive normal Pap tests.  HPV screening.** / Every 3 years from ages 30 years through ages 65 to 70 years with a history of 3 consecutive normal Pap tests.  Fecal occult blood test (FOBT) of stool. / Every year beginning at age 50 years and continuing until age 75 years. You may not need to do this test if you get a colonoscopy every 10 years.  Flexible sigmoidoscopy or colonoscopy.** / Every 5 years for a flexible sigmoidoscopy or every 10 years for a colonoscopy beginning at age 50 years and continuing until age 75 years.  Hepatitis C blood test.** / For all people born from 1945 through  1965 and any individual with known risks for hepatitis C.  Skin self-exam. / Monthly.  Influenza vaccine. / Every year.  Tetanus, diphtheria, and acellular pertussis (Tdap/Td) vaccine.** / Consult your health care provider. Pregnant women should receive 1 dose of Tdap vaccine during each pregnancy. 1 dose of Td every 10 years.  Varicella vaccine.** / Consult your health care provider. Pregnant females who do not have evidence of immunity should receive the first dose after pregnancy.  Zoster vaccine.** / 1 dose for adults aged 60 years or older.  Measles, mumps, rubella (MMR) vaccine.** / You need at least 1 dose of MMR if you were born in 1957 or later. You may also need a 2nd dose. For females of childbearing age, rubella immunity should be determined. If there is no evidence of immunity, females who are not pregnant should be vaccinated. If there is no evidence of immunity, females who are pregnant should delay immunization until after pregnancy.  Pneumococcal 13-valent conjugate (PCV13) vaccine.** / Consult your health care provider.  Pneumococcal polysaccharide (PPSV23) vaccine.** / 1 to 2 doses if you smoke cigarettes or if you have certain conditions.  Meningococcal vaccine.** / Consult your health care provider.  Hepatitis A vaccine.** / Consult your health care provider.  Hepatitis B vaccine.** / Consult your health care provider.  Haemophilus influenzae type b (Hib) vaccine.** / Consult your health care provider. Ages 65   years and over  Blood pressure check.** / Every 1 to 2 years.  Lipid and cholesterol check.** / Every 5 years beginning at age 22 years.  Lung cancer screening. / Every year if you are aged 73-80 years and have a 30-pack-year history of smoking and currently smoke or have quit within the past 15 years. Yearly screening is stopped once you have quit smoking for at least 15 years or develop a health problem that would prevent you from having lung cancer  treatment.  Clinical breast exam.** / Every year after age 4 years.  BRCA-related cancer risk assessment.** / For women who have family members with a BRCA-related cancer (breast, ovarian, tubal, or peritoneal cancers).  Mammogram.** / Every year beginning at age 40 years and continuing for as long as you are in good health. Consult with your health care provider.  Pap test.** / Every 3 years starting at age 9 years through age 34 or 91 years with 3 consecutive normal Pap tests. Testing can be stopped between 65 and 70 years with 3 consecutive normal Pap tests and no abnormal Pap or HPV tests in the past 10 years.  HPV screening.** / Every 3 years from ages 57 years through ages 64 or 45 years with a history of 3 consecutive normal Pap tests. Testing can be stopped between 65 and 70 years with 3 consecutive normal Pap tests and no abnormal Pap or HPV tests in the past 10 years.  Fecal occult blood test (FOBT) of stool. / Every year beginning at age 15 years and continuing until age 17 years. You may not need to do this test if you get a colonoscopy every 10 years.  Flexible sigmoidoscopy or colonoscopy.** / Every 5 years for a flexible sigmoidoscopy or every 10 years for a colonoscopy beginning at age 86 years and continuing until age 71 years.  Hepatitis C blood test.** / For all people born from [birth year] through 1963 and any individual with known risks for hepatitis C.  Osteoporosis screening.** / A one-time screening for women ages 83 years and over and women at risk for fractures or osteoporosis.  Skin self-exam. / Monthly.  Influenza vaccine. / Every year.  Tetanus, diphtheria, and acellular pertussis (Tdap/Td) vaccine.** / 1 dose of Td every 10 years.  Varicella vaccine.** / Consult your health care provider.  Zoster vaccine.** / 1 dose for adults aged 61 years or older.  Pneumococcal 13-valent conjugate (PCV13) vaccine.** / Consult your health care provider.  Pneumococcal  polysaccharide (PPSV23) vaccine.** / 1 dose for all adults aged 28 years and older.  Meningococcal vaccine.** / Consult your health care provider.  Hepatitis A vaccine.** / Consult your health care provider.  Hepatitis B vaccine.** / Consult your health care provider.  Haemophilus influenzae type b (Hib) vaccine.** / Consult your health care provider. ** Family history and personal history of risk and conditions may change your health care provider's recommendations. Document Released: 07/26/2001 Document Revised: 10/14/2013 Document Reviewed: 10/25/2010 [facility] Patient Information 2015 [place], [place]. This information is not intended to replace advice given to you by your health care provider. Make sure you discuss any questions you have with your health care provider.

## 2014-09-02 NOTE — Progress Notes (Signed)
Pre visit review using our clinic review tool, if applicable. No additional management support is needed unless otherwise documented below in the visit note. 

## 2014-09-02 NOTE — Addendum Note (Signed)
Addended by: Peggyann Shoals on: 09/02/2014 02:50 PM   Modules accepted: Orders

## 2014-09-03 LAB — URINE CULTURE: Colony Count: 50000

## 2014-09-08 DIAGNOSIS — H35342 Macular cyst, hole, or pseudohole, left eye: Secondary | ICD-10-CM | POA: Diagnosis not present

## 2014-09-08 DIAGNOSIS — H16223 Keratoconjunctivitis sicca, not specified as Sjogren's, bilateral: Secondary | ICD-10-CM | POA: Diagnosis not present

## 2014-09-08 DIAGNOSIS — H3531 Nonexudative age-related macular degeneration: Secondary | ICD-10-CM | POA: Diagnosis not present

## 2014-09-08 DIAGNOSIS — H2513 Age-related nuclear cataract, bilateral: Secondary | ICD-10-CM | POA: Diagnosis not present

## 2014-09-08 DIAGNOSIS — H524 Presbyopia: Secondary | ICD-10-CM | POA: Diagnosis not present

## 2014-10-14 DIAGNOSIS — Z1231 Encounter for screening mammogram for malignant neoplasm of breast: Secondary | ICD-10-CM | POA: Diagnosis not present

## 2014-10-14 DIAGNOSIS — M810 Age-related osteoporosis without current pathological fracture: Secondary | ICD-10-CM | POA: Diagnosis not present

## 2014-10-14 DIAGNOSIS — Z780 Asymptomatic menopausal state: Secondary | ICD-10-CM | POA: Diagnosis not present

## 2014-10-31 ENCOUNTER — Other Ambulatory Visit: Payer: Self-pay | Admitting: Family Medicine

## 2014-10-31 DIAGNOSIS — M810 Age-related osteoporosis without current pathological fracture: Secondary | ICD-10-CM

## 2014-10-31 MED ORDER — ZOLEDRONIC ACID 5 MG/100ML IV SOLN: 5.0000 mg | Freq: Once | INTRAVENOUS | Status: DC

## 2014-11-05 ENCOUNTER — Encounter: Payer: Self-pay | Admitting: Family Medicine

## 2014-12-03 ENCOUNTER — Telehealth: Payer: Self-pay

## 2014-12-03 NOTE — Telephone Encounter (Signed)
Medicare AARP benefits verified as of 11/28/14 the procedure J3489 and admin [id number] requires no Prior Authorization. Dx.M81.0  Coverage is based on medicare guidelines. This is an open access plan and requires no referral. There is a 20 percent co-insurance for the facility and a $345 co-pay to the outpatient facility. The plan has been effective since 06/13/14. I called the patient to make her aware and schedule the procedure and she said she will not have the procedure done this year, she did not disclose a reason but said she was not going to do it at this time.     KP

## 2014-12-08 ENCOUNTER — Other Ambulatory Visit: Payer: Self-pay

## 2015-03-20 ENCOUNTER — Ambulatory Visit (INDEPENDENT_AMBULATORY_CARE_PROVIDER_SITE_OTHER): Payer: Medicare Other

## 2015-03-20 DIAGNOSIS — Z23 Encounter for immunization: Secondary | ICD-10-CM

## 2015-03-20 MED ORDER — PNEUMOCOCCAL 13-VAL CONJ VACC IM SUSP
0.5000 mL | Freq: Once | INTRAMUSCULAR | Status: AC
  Administered 2015-03-20: 0.5 mL via INTRAMUSCULAR

## 2015-03-20 NOTE — Addendum Note (Signed)
Addended by: Bunnie Domino on: 03/20/2015 02:44 PM   Modules accepted: Orders

## 2015-07-17 ENCOUNTER — Encounter: Payer: Self-pay | Admitting: Medical

## 2015-07-17 ENCOUNTER — Ambulatory Visit (INDEPENDENT_AMBULATORY_CARE_PROVIDER_SITE_OTHER): Payer: Medicare Other | Admitting: Medical

## 2015-07-17 VITALS — BP 138/80 | HR 66 | Temp 98.2°F | Ht <= 58 in | Wt 123.8 lb

## 2015-07-17 DIAGNOSIS — J0100 Acute maxillary sinusitis, unspecified: Secondary | ICD-10-CM

## 2015-07-17 DIAGNOSIS — J209 Acute bronchitis, unspecified: Secondary | ICD-10-CM | POA: Diagnosis not present

## 2015-07-17 MED ORDER — AZITHROMYCIN 250 MG PO TABS: ORAL_TABLET | ORAL | Status: DC

## 2015-07-17 MED ORDER — FLUTICASONE PROPIONATE 50 MCG/ACT NA SUSP: 2.0000 | Freq: Every day | NASAL | Status: DC

## 2015-07-17 MED ORDER — BENZONATATE 100 MG PO CAPS: 100.0000 mg | ORAL_CAPSULE | Freq: Three times a day (TID) | ORAL | Status: DC | PRN

## 2015-07-17 NOTE — Patient Instructions (Addendum)
You appear to have  Sinusitis and possible bronchitis. Rest hydrate and tylenol for fever. I am prescribing cough medicine benzonatate , and azithromycin antibiotic. For your nasal congestion you could use otc the counter nasal steroid flonase.  You should gradually get better. If not then notify us and would recommend a chest xray.  You express frustration with nasal congestion. Try above first. If by Monday nasal congestion still severe could consider depomedrol im low dose. Stop afrin.  Follow up in 7-10 days or as needed

## 2015-07-17 NOTE — Progress Notes (Signed)
Pre visit review using our clinic review tool, if applicable. No additional management support is needed unless otherwise documented below in the visit note. 

## 2015-07-17 NOTE — Progress Notes (Signed)
Subjective:    Patient ID: Cassidy Brown, female    DOB: 1929-11-26, 82 y.o.   MRN: [id number]  HPI   Pt in with some nasal congestion and mild faint hoarse voice. Pt had symptom since Sunday. Pt had some chills and feeling mild warm. Temp 99.  Pt has some sinus pressure. Pt states hard to sleep since so congested. Some blood tinged mucous when she blows her nose.   Pt has used afrin intermittently. About 3 times in past 6 days.  Some mild teeth sensitivity with sinus pressure.   Review of Systems  Constitutional: Positive for fever and chills. Negative for fatigue.  HENT: Positive for congestion, sinus pressure and voice change. Negative for ear pain and sore throat.   Respiratory: Positive for cough. Negative for chest tightness.        Some productive cough.  Cardiovascular: Negative for chest pain and palpitations.  Gastrointestinal: Negative for abdominal pain.  Musculoskeletal: Negative for back pain.  Hematological: Negative for adenopathy. Does not bruise/bleed easily.  Psychiatric/Behavioral: Negative for behavioral problems and confusion.    Past Medical History  Diagnosis Date  . Diverticulosis   . Hypertension   . Osteoporosis   . Cancer ([place])     breast,hx of  . Vertigo     dizziness  . Poliomyelitis   . Allergy     Social History   Social History  . Marital Status: Married    Spouse Name: N/A  . Number of Children: N/A  . Years of Education: N/A   Occupational History  . [employer] --retired    Social History Main Topics  . Smoking status: Never Smoker   . Smokeless tobacco: Not on file  . Alcohol Use: No  . Drug Use: No  . Sexual Activity:    Partners: Male   Other Topics Concern  . Not on file   Social History Narrative   Exercise-- walking 1 x a week    Past Surgical History  Procedure Laterality Date  . Breast surgery    . Lumbar laminectomy    . Dilation and curettage of uterus      x3  . Ganglion cyst r wrist    .  Spine surgery  11/2010    fusion--jenkins    Family History  Problem Relation Age of Onset  . Coronary artery disease Other     P uncles  . Hypertension Father   . Kidney disease Father   . Hypertension Sister     Allergies  Allergen Reactions  . Amoxicillin Diarrhea    "I had diarrhea really bad."  . Hydrocodone   . Plendil [Felodipine]     Caused pains in back and stomach and caused constipation  . Propoxyphene N-Acetaminophen   . Tenex [Guanfacine Hcl]     Made patient feel really bad and was ineffective in decreasing BP    Current Outpatient Prescriptions on File Prior to Visit  Medication Sig Dispense Refill  . atenolol (TENORMIN) 50 MG tablet Take 1.5 tablets (75 mg total) by mouth daily. Office visit due now 135 tablet 3  . Bilberry, Vaccinium myrtillus, (BILBERRY PO) Take 1 capsule by mouth daily.    . calcium citrate-vitamin D (CITRACAL+D) 315-200 MG-UNIT per tablet Take 1 tablet by mouth daily.    . cholecalciferol (VITAMIN D) 1000 UNITS tablet Take 1,000 Units by mouth daily.      . Ginkgo Biloba (GINKGO PO) Take 1 tablet by mouth daily.    Marland Kitchen  glucosamine-chondroitin 500-400 MG tablet Take 1 tablet by mouth 3 (three) times daily.    Marland Kitchen loratadine (CLARITIN) 10 MG tablet Take 10 mg by mouth daily.      Marland Kitchen LORazepam (ATIVAN) 1 MG tablet TAKE 1 TABLET EVERY 8 HOURS 60 tablet 0  . MAGNESIUM PO Take 500 mg by mouth daily.     . meclizine (ANTIVERT) 25 MG tablet Take 1 tablet (25 mg total) by mouth 3 (three) times daily as needed. 60 tablet 0  . meloxicam (MOBIC) 15 MG tablet Take 1 tablet (15 mg total) by mouth as needed. 90 tablet 0  . Misc Natural Products (LUTEIN 20 PO) Take 1 capsule by mouth daily.    . multivitamin-lutein (OCUVITE-LUTEIN) CAPS Take 1 capsule by mouth daily.    . pneumococcal 13-valent conjugate vaccine (PREVNAR 13) SUSP injection Inject 0.5 mLs into the muscle tomorrow at 10 am. 0.5 mL 0  . pseudoephedrine (SUDAFED) 30 MG tablet Take 30 mg by mouth as  needed.      . terazosin (HYTRIN) 10 MG capsule Take 1 capsule by mouth at  bedtime 90 capsule 3  . thiamine (VITAMIN B-1) 100 MG tablet Take 100 mg by mouth daily.    . zoledronic acid (RECLAST) 5 MG/100ML SOLN injection Inject 100 mLs (5 mg total) into the vein once. 100 mL 0   No current facility-administered medications on file prior to visit.    BP 138/80 mmHg  Pulse 66  Temp(Src) 98.2 F (36.8 C) (Oral)  Ht 4' 9.75" (1.467 m)  Wt 123 lb 12.8 oz (56.155 kg)  BMI 26.09 kg/m2  SpO2 98%       Objective:   Physical Exam  General  Mental Status - Alert. General Appearance - Well groomed. Not in acute distress.  Skin Rashes- No Rashes.  HEENT Head- Normal. Ear Auditory Canal - Left- Normal. Right - Normal.Tympanic Membrane- Left- Normal. Right- Normal. Eye Sclera/Conjunctiva- Left- Normal. Right- Normal. Nose & Sinuses Nasal Mucosa- Left-  Boggy and Congested. Right-  Boggy and  Congested.Bilateral maxillary and frontal sinus pressure. Mouth & Throat Lips: Upper Lip- Normal: no dryness, cracking, pallor, cyanosis, or vesicular eruption. Lower Lip-Normal: no dryness, cracking, pallor, cyanosis or vesicular eruption. Buccal Mucosa- Bilateral- No Aphthous ulcers. Oropharynx- No Discharge or Erythema. Tonsils: Characteristics- Bilateral- No Erythema or Congestion. Size/Enlargement- Bilateral- No enlargement. Discharge- bilateral-None.  Neck Neck- Supple. No Masses.   Chest and Lung Exam Auscultation: Breath Sounds:-Clear even and unlabored.  Cardiovascular Auscultation:Rythm- Regular, rate and rhythm. Murmurs & Other Heart Sounds:Ausculatation of the heart reveal- No Murmurs.  Lymphatic Head & Neck General Head & Neck Lymphatics: Bilateral: Description- No Localized lymphadenopathy.       Assessment & Plan:   You appear to have  Sinusitis and possible bronchitis. Rest hydrate and tylenol for fever. I am prescribing cough medicine benzonatate , and  azithromycin antibiotic. For your nasal congestion you could use otc the counter nasal steroid flonase.  You should gradually get better. If not then notify us and would recommend a chest xray.  You express frustration with nasal congestion. Try above first. If by Monday nasal congestion still severe could consider depomedrol im low dose. Stop afrin.  Follow up in 7-10 days or as needed

## 2015-08-06 ENCOUNTER — Telehealth: Payer: Self-pay | Admitting: Family Medicine

## 2015-08-06 ENCOUNTER — Other Ambulatory Visit: Payer: Self-pay | Admitting: Family Medicine

## 2015-08-06 DIAGNOSIS — R42 Dizziness and giddiness: Secondary | ICD-10-CM

## 2015-08-06 MED ORDER — MECLIZINE HCL 25 MG PO TABS: 25.0000 mg | ORAL_TABLET | Freq: Three times a day (TID) | ORAL | Status: DC | PRN

## 2015-08-06 NOTE — Telephone Encounter (Signed)
Pt "would like a new rx of Meclizine 25 mg for the vertigo."

## 2015-08-06 NOTE — Telephone Encounter (Signed)
Meclizine sent in  

## 2015-08-06 NOTE — Telephone Encounter (Signed)
Patient Name: Cassidy Brown DOB: 03-17-1930 Initial Comment Caller states is Dizzy and has veritgo Nurse Assessment Nurse: Marcelline Deist, RN, Lynda Date/Time (Eastern Time): 08/06/2015 11:41:34 AM Confirm and document reason for call. If symptomatic, describe symptoms. You must click the next button to save text entered. ---Caller states is dizzy and has veritigo. Has rx- Meclizine for it, although it is expired. Felt a little nausea. Symptoms since Sat. Has had some off & on headache. Has the patient traveled out of the country within the last 30 days? ---Not Applicable Does the patient have any new or worsening symptoms? ---Yes Will a triage be completed? ---Yes Related visit to physician within the last 2 weeks? ---No Does the PT have any chronic conditions? (i.e. diabetes, asthma, etc.) ---Yes List chronic conditions. ---on BP rx, hx of vertigo Is this a behavioral health or substance abuse call? ---No Guidelines Guideline Title Affirmed Question Affirmed Notes Dizziness - Vertigo [1] MODERATE dizziness (e.g., vertigo; feels very unsteady, interferes with normal activities) AND [2] has been evaluated by physician for this Final Disposition User See PCP When Office is Open (within 3 days) Marcelline Deist, RN, [employer] states she was treated for sinusitis & bronchitis a few weeks ago with an antibiotic which improved. She really thinks her rx is expired and that it is not working. Would like a new rx of Meclizine 25 mg for the vertigo. Please contact her at this # regarding prescription. She does not feel she needs to be seen at this time. She sees Dr. Etter Sjogren. Uses [facility] & states her chart should indicate allergies to certain medication.Caller states they can leave a message on this # regarding rx. Referrals REFERRED TO PCP OFFICE Disagree/Comply: Disagree Disagree/Comply Reason: Disagree with instructions

## 2015-08-06 NOTE — Telephone Encounter (Signed)
What med?

## 2015-08-06 NOTE — Telephone Encounter (Signed)
Called to follow up with patient.  Pt states that she has been dealing with dizziness on and off for years.  Recent episode started Saturday and has not gone away.  She denies at this time nausea, chest pain, shortness of breath, fever, chills, weakness, numbness, tingling, changes in vision, abnormal BPs (states blood pressure is fine).  Pt thinks the dizziness is coming from her medication being expired and would like a new prescription.    Please advise.

## 2015-08-06 NOTE — Telephone Encounter (Signed)
Pt notified and made aware.  She was advised if symptoms worsen or new symptoms developed to go to the ER.  If dizziness persists, but doesn't worsen, after taking meclizine, call back to schedule an appt.  Pt stated understanding and agreed to comply.

## 2015-08-18 ENCOUNTER — Other Ambulatory Visit: Payer: Self-pay | Admitting: Family Medicine

## 2015-08-18 NOTE — Telephone Encounter (Signed)
Please schedule an apt for a CPE.     KP

## 2015-08-20 NOTE — Telephone Encounter (Signed)
Patient scheduled her physical for 12/22/2015, patient wanted to be seen sooner advised her i would place her on the wait list. (patient wanted any day but Monday)

## 2015-08-25 DIAGNOSIS — H524 Presbyopia: Secondary | ICD-10-CM | POA: Diagnosis not present

## 2015-08-25 DIAGNOSIS — H2513 Age-related nuclear cataract, bilateral: Secondary | ICD-10-CM | POA: Diagnosis not present

## 2015-08-25 DIAGNOSIS — H31013 Macula scars of posterior pole (postinflammatory) (post-traumatic), bilateral: Secondary | ICD-10-CM | POA: Diagnosis not present

## 2015-08-25 DIAGNOSIS — H04123 Dry eye syndrome of bilateral lacrimal glands: Secondary | ICD-10-CM | POA: Diagnosis not present

## 2015-10-23 DIAGNOSIS — Z1231 Encounter for screening mammogram for malignant neoplasm of breast: Secondary | ICD-10-CM | POA: Diagnosis not present

## 2015-10-23 DIAGNOSIS — Z139 Encounter for screening, unspecified: Secondary | ICD-10-CM | POA: Diagnosis not present

## 2015-10-23 LAB — HM MAMMOGRAPHY

## 2015-11-17 ENCOUNTER — Other Ambulatory Visit: Payer: Self-pay | Admitting: Family Medicine

## 2015-12-09 ENCOUNTER — Encounter: Payer: Self-pay | Admitting: Family Medicine

## 2015-12-22 ENCOUNTER — Ambulatory Visit (INDEPENDENT_AMBULATORY_CARE_PROVIDER_SITE_OTHER): Payer: Medicare Other | Admitting: Family Medicine

## 2015-12-22 ENCOUNTER — Encounter: Payer: Self-pay | Admitting: Family Medicine

## 2015-12-22 VITALS — BP 176/82 | HR 53 | Temp 98.2°F | Ht 60.0 in | Wt 123.2 lb

## 2015-12-22 DIAGNOSIS — I10 Essential (primary) hypertension: Secondary | ICD-10-CM

## 2015-12-22 DIAGNOSIS — N390 Urinary tract infection, site not specified: Secondary | ICD-10-CM | POA: Diagnosis not present

## 2015-12-22 DIAGNOSIS — Z9189 Other specified personal risk factors, not elsewhere classified: Secondary | ICD-10-CM

## 2015-12-22 DIAGNOSIS — R978 Other abnormal tumor markers: Secondary | ICD-10-CM | POA: Diagnosis not present

## 2015-12-22 DIAGNOSIS — Z0000 Encounter for general adult medical examination without abnormal findings: Secondary | ICD-10-CM

## 2015-12-22 DIAGNOSIS — R82998 Other abnormal findings in urine: Secondary | ICD-10-CM

## 2015-12-22 DIAGNOSIS — Z23 Encounter for immunization: Secondary | ICD-10-CM

## 2015-12-22 DIAGNOSIS — F411 Generalized anxiety disorder: Secondary | ICD-10-CM

## 2015-12-22 LAB — POCT URINALYSIS DIPSTICK
BILIRUBIN UA: NEGATIVE
Blood, UA: NEGATIVE
GLUCOSE UA: NEGATIVE
Ketones, UA: NEGATIVE
NITRITE UA: NEGATIVE
Protein, UA: NEGATIVE
Spec Grav, UA: 1.02
Urobilinogen, UA: 0.2
pH, UA: 7.5

## 2015-12-22 LAB — COMPREHENSIVE METABOLIC PANEL
ALT: 15 U/L (ref 0–35)
AST: 21 U/L (ref 0–37)
Albumin: 3.8 g/dL (ref 3.5–5.2)
Alkaline Phosphatase: 56 U/L (ref 39–117)
BUN: 16 mg/dL (ref 6–23)
CHLORIDE: 104 meq/L (ref 96–112)
CO2: 31 meq/L (ref 19–32)
Calcium: 9.1 mg/dL (ref 8.4–10.5)
Creatinine, Ser: 0.57 mg/dL (ref 0.40–1.20)
GFR: 107.43 mL/min (ref >60.00)
GLUCOSE: 98 mg/dL (ref 70–99)
POTASSIUM: 3.7 meq/L (ref 3.5–5.1)
SODIUM: 140 meq/L (ref 135–145)
TOTAL PROTEIN: 6.5 g/dL (ref 6.0–8.3)
Total Bilirubin: 0.5 mg/dL (ref 0.2–1.2)

## 2015-12-22 LAB — CBC WITH DIFFERENTIAL/PLATELET
BASOS PCT: 0.6 % (ref 0.0–3.0)
Basophils Absolute: 0 10*3/uL (ref 0.0–0.1)
EOS PCT: 2.1 % (ref 0.0–5.0)
Eosinophils Absolute: 0.1 10*3/uL (ref 0.0–0.7)
HCT: 36.9 % (ref 36.0–46.0)
HEMOGLOBIN: 12.3 g/dL (ref 12.0–15.0)
LYMPHS ABS: 0.9 10*3/uL (ref 0.7–4.0)
Lymphocytes Relative: 18.8 % (ref 12.0–46.0)
MCHC: 33.2 g/dL (ref 30.0–36.0)
MCV: 88.5 fl (ref 78.0–100.0)
MONO ABS: 0.4 10*3/uL (ref 0.1–1.0)
Monocytes Relative: 7.8 % (ref 3.0–12.0)
NEUTROS PCT: 70.7 % (ref 43.0–77.0)
Neutro Abs: 3.5 10*3/uL (ref 1.4–7.7)
Platelets: 213 10*3/uL (ref 150.0–400.0)
RBC: 4.17 Mil/uL (ref 3.87–5.11)
RDW: 14.8 % (ref 11.5–15.5)
WBC: 4.9 10*3/uL (ref 4.0–10.5)

## 2015-12-22 LAB — LIPID PANEL
CHOLESTEROL: 156 mg/dL (ref 0–200)
HDL: 63.8 mg/dL (ref >39.00)
LDL CALC: 78 mg/dL (ref 0–99)
NonHDL: 92.08
Total CHOL/HDL Ratio: 2
Triglycerides: 72 mg/dL (ref 0.0–149.0)
VLDL: 14.4 mg/dL (ref 0.0–40.0)

## 2015-12-22 MED ORDER — LORAZEPAM 1 MG PO TABS: ORAL_TABLET | ORAL | Status: DC

## 2015-12-22 MED ORDER — TETANUS-DIPHTHERIA TOXOIDS TD 2-2 LF/0.5ML IM SUSP: 0.5000 mL | Freq: Once | INTRAMUSCULAR | Status: DC

## 2015-12-22 MED ORDER — ATENOLOL 50 MG PO TABS: 75.0000 mg | ORAL_TABLET | Freq: Every day | ORAL | Status: DC

## 2015-12-22 MED ORDER — TERAZOSIN HCL 10 MG PO CAPS: ORAL_CAPSULE | ORAL | Status: DC

## 2015-12-22 NOTE — Progress Notes (Signed)
Subjective:   Cassidy Brown is a 82 y.o. female who presents for Medicare Annual (Subsequent) preventive examination.  Review of Systems:   Review of Systems  Constitutional: Negative for activity change, appetite change and fatigue.  HENT: Negative for hearing loss, congestion, tinnitus and ear discharge.   Eyes: Negative for visual disturbance (see optho q1y -- vision corrected to 20/20 with glasses).  Respiratory: Negative for cough, chest tightness and shortness of breath.   Cardiovascular: Negative for chest pain, palpitations and leg swelling.  Gastrointestinal: Negative for abdominal pain, diarrhea, constipation and abdominal distention.  Genitourinary: Negative for urgency, frequency, decreased urine volume and difficulty urinating.  Musculoskeletal: Negative for back pain, arthralgias and gait problem.  Skin: Negative for color change, pallor and rash.  Neurological: Negative for dizziness, light-headedness, numbness and headaches.  Hematological: Negative for adenopathy. Does not bruise/bleed easily.  Psychiatric/Behavioral: Negative for suicidal ideas, confusion, sleep disturbance, self-injury, dysphoric mood, decreased concentration and agitation.  Pt is able to read and write and can do all ADLs No risk for falling No abuse/ violence in home           Objective:     Vitals: BP 176/82 mmHg  Pulse 53  Temp(Src) 98.2 F (36.8 C) (Oral)  Ht 5' (1.524 m)  Wt 123 lb 3.2 oz (55.883 kg)  BMI 24.06 kg/m2  SpO2 98%  Body mass index is 24.06 kg/(m^2).  BP 176/82 mmHg  Pulse 53  Temp(Src) 98.2 F (36.8 C) (Oral)  Ht 5' (1.524 m)  Wt 123 lb 3.2 oz (55.883 kg)  BMI 24.06 kg/m2  SpO2 98% General appearance: alert, cooperative, appears stated age and no distress Head: Normocephalic, without obvious abnormality, atraumatic Eyes: negative findings: lids and lashes normal, conjunctivae and sclerae normal and pupils equal, round, reactive to light and  accomodation Ears: normal TM's and external ear canals both ears Nose: Nares normal. Septum midline. Mucosa normal. No drainage or sinus tenderness. Throat: lips, mucosa, and tongue normal; teeth and gums normal Neck: no adenopathy, no carotid bruit, no JVD, supple, symmetrical, trachea midline and thyroid not enlarged, symmetric, no tenderness/mass/nodules Back: range of motion normal, + scoliosis Lungs: clear to auscultation bilaterally Breasts: normal appearance, no masses or tenderness--scar L breast Heart: S1, S2 normal Abdomen: soft, non-tender; bowel sounds normal; no masses,  no organomegaly Pelvic: not indicated; post-menopausal, no abnormal Pap smears in past Extremities: extremities normal, atraumatic, no cyanosis or edema Pulses: 2+ and symmetric Skin: Skin color, texture, turgor normal. No rashes or lesions Lymph nodes: Cervical, supraclavicular, and axillary nodes normal. Neurologic: Alert and oriented X 3, normal strength and tone. Normal symmetric reflexes. Normal coordination and gait Tobacco History  Smoking status  . Never Smoker   Smokeless tobacco  . Not on file     Counseling given: Not Answered   Past Medical History  Diagnosis Date  . Diverticulosis   . Hypertension   . Osteoporosis   . Cancer ([place])     breast,hx of  . Vertigo     dizziness  . Poliomyelitis   . Allergy    Past Surgical History  Procedure Laterality Date  . Breast surgery    . Lumbar laminectomy    . Dilation and curettage of uterus      x3  . Ganglion cyst r wrist    . Spine surgery  11/2010    fusion--jenkins   Family History  Problem Relation Age of Onset  . Coronary artery disease Other  P uncles  . Hypertension Father   . Kidney disease Father   . Hypertension Sister    History  Sexual Activity  . Sexual Activity:  . Partners: Male    Outpatient Encounter Prescriptions as of 12/22/2015  Medication Sig  . atenolol (TENORMIN) 50 MG tablet Take 1.5 tablets (75  mg total) by mouth daily.  Marland Kitchen b complex vitamins tablet Take 1 tablet by mouth daily.  . calcium citrate-vitamin D (CITRACAL+D) 315-200 MG-UNIT per tablet Take 1 tablet by mouth 3 (three) times daily.  . cholecalciferol (VITAMIN D) 1000 UNITS tablet Take 1,000 Units by mouth daily.    . Ginkgo Biloba (GINKGO PO) Take 1 tablet by mouth daily.  Marland Kitchen glucosamine-chondroitin 500-400 MG tablet Take 1 tablet by mouth 3 (three) times daily.  Marland Kitchen loratadine (CLARITIN) 10 MG tablet Take 10 mg by mouth daily.    Marland Kitchen LORazepam (ATIVAN) 1 MG tablet TAKE 1 TABLET EVERY 8 HOURS  . MAGNESIUM PO Take 500 mg by mouth daily.   . meclizine (ANTIVERT) 25 MG tablet Take 1 tablet (25 mg total) by mouth 3 (three) times daily as needed.  . meloxicam (MOBIC) 15 MG tablet Take 1 tablet (15 mg total) by mouth as needed.  . Misc Natural Products (LUTEIN 20 PO) Take 1 capsule by mouth daily.  . multivitamin-lutein (OCUVITE-LUTEIN) CAPS Take 1 capsule by mouth daily.  . pseudoephedrine (SUDAFED) 30 MG tablet Take 30 mg by mouth as needed.    . terazosin (HYTRIN) 10 MG capsule Take 1 capsule by mouth at  bedtime  . [DISCONTINUED] atenolol (TENORMIN) 50 MG tablet Take 1.5 tablets (75 mg total) by mouth daily.  . [DISCONTINUED] azithromycin (ZITHROMAX) 250 MG tablet Take 2 tablets by mouth on day 1, followed by 1 tablet by mouth daily for 4 days.  . [DISCONTINUED] benzonatate (TESSALON) 100 MG capsule Take 1 capsule (100 mg total) by mouth 3 (three) times daily as needed.  . [DISCONTINUED] fluticasone (FLONASE) 50 MCG/ACT nasal spray Place 2 sprays into both nostrils daily.  . [DISCONTINUED] LORazepam (ATIVAN) 1 MG tablet TAKE 1 TABLET EVERY 8 HOURS  . [DISCONTINUED] pneumococcal 13-valent conjugate vaccine (PREVNAR 13) SUSP injection Inject 0.5 mLs into the muscle tomorrow at 10 am.  . [DISCONTINUED] terazosin (HYTRIN) 10 MG capsule Take 1 capsule by mouth at  bedtime  . [DISCONTINUED] zoledronic acid (RECLAST) 5 MG/100ML SOLN  injection Inject 100 mLs (5 mg total) into the vein once.  . diptheria-tetanus toxoids (DECAVAC) 2-2 LF/0.5ML injection Inject 0.5 mLs into the muscle once.  . [DISCONTINUED] Bilberry, Vaccinium myrtillus, (BILBERRY PO) Take 1 capsule by mouth daily.  . [DISCONTINUED] thiamine (VITAMIN B-1) 100 MG tablet Take 100 mg by mouth daily.   No facility-administered encounter medications on file as of 12/22/2015.    Activities of Daily Living In your present state of health, do you have any difficulty performing the following activities: 12/22/2015  Hearing? N  Vision? N  Difficulty concentrating or making decisions? N  Walking or climbing stairs? Y  Dressing or bathing? N  Doing errands, shopping? N    Patient Care Team: [name], DO as PCP - General Marygrace Drought, MD as Consulting Physician (Ophthalmology)    Assessment:    cpe Exercise Activities and Dietary recommendations Current Exercise Habits: Home exercise routine, Type of exercise: strength training/weights, Time (Minutes): 15, Frequency (Times/Week): 3, Weekly Exercise (Minutes/Week): 45, Intensity: Mild, Exercise limited by: orthopedic condition(s)  Goals    None  Fall Risk Fall Risk  12/22/2015 09/02/2014 08/20/2013 08/20/2013 07/12/2012  Falls in the past year? No No Yes No Yes  Number falls in past yr: - - - - 1  Injury with Fall? - - - - No  Risk for fall due to : - - - - Other (Comment)  Risk for fall due to (comments): - - - - Patient fell forward leaning forward in a chair   Depression Screen PHQ 2/9 Scores 12/22/2015 09/02/2014 08/20/2013 08/20/2013  PHQ - 2 Score 0 0 0 0     Cognitive Testing mmse 30/30  Immunization History  Administered Date(s) Administered  . Influenza Split 04/27/2011, 03/27/2012  . Influenza Whole 05/03/2007, 03/07/2008, 03/11/2009, 03/16/2010, 04/27/2010  . Influenza, High Dose Seasonal PF 04/09/2013  . Influenza,inj,Quad PF,36+ Mos 03/20/2014, 03/20/2015  . Pneumococcal  Conjugate-13 03/20/2015  . Pneumococcal Polysaccharide-23 03/11/2009   Screening Tests Health Maintenance  Topic Date Due  . TETANUS/TDAP  02/04/1949  . INFLUENZA VACCINE  01/12/2016  . MAMMOGRAM  10/22/2016  . DEXA SCAN  Completed  . ZOSTAVAX  Completed  . PNA vac Low Risk Adult  Completed      Plan:   see AVS During the course of the visit the patient was educated and counseled about the following appropriate screening and preventive services:   Vaccines to include Pneumoccal, Influenza, Hepatitis B, Td, Zostavax, HCV  Electrocardiogram  Cardiovascular Disease  Colorectal cancer screening  Bone density screening  Diabetes screening  Glaucoma screening  Mammography/PAP  Nutrition counseling   Patient Instructions (the written plan) was given to the patient.  1. High risk of ovarian cancer Pt would like to be tested secondary to using shower to shower for years and hx breast ca - CA 125  2. Essential hypertension stable - terazosin (HYTRIN) 10 MG capsule; Take 1 capsule by mouth at  bedtime  Dispense: 90 capsule; Refill: 1 - atenolol (TENORMIN) 50 MG tablet; Take 1.5 tablets (75 mg total) by mouth daily.  Dispense: 135 tablet; Refill: 1 - Comprehensive metabolic panel - CBC with Differential/Platelet - Lipid panel - POCT urinalysis dipstick  3. Generalized anxiety disorder  - LORazepam (ATIVAN) 1 MG tablet; TAKE 1 TABLET EVERY 8 HOURS  Dispense: 60 tablet; Refill: 0  4. Preventative health care See above  5. Need for tetanus booster    6. Routine history and physical examination of adult    [name], DO  12/22/2015

## 2015-12-22 NOTE — Patient Instructions (Addendum)
Preventive Care for Adults, Female A healthy lifestyle and preventive care can promote health and wellness. Preventive health guidelines for women include the following key practices.  A routine yearly physical is a good way to check with your health care provider about your health and preventive screening. It is a chance to share any concerns and updates on your health and to receive a thorough exam.  Visit your dentist for a routine exam and preventive care every 6 months. Brush your teeth twice a day and floss once a day. Good oral hygiene prevents tooth decay and gum disease.  The frequency of eye exams is based on your age, health, family medical history, use of contact lenses, and other factors. Follow your health care provider's recommendations for frequency of eye exams.  Eat a healthy diet. Foods like vegetables, fruits, whole grains, low-fat dairy products, and lean protein foods contain the nutrients you need without too many calories. Decrease your intake of foods high in solid fats, added sugars, and salt. Eat the right amount of calories for you.Get information about a proper diet from your health care provider, if necessary.  Regular physical exercise is one of the most important things you can do for your health. Most adults should get at least 150 minutes of moderate-intensity exercise (any activity that increases your heart rate and causes you to sweat) each week. In addition, most adults need muscle-strengthening exercises on 2 or more days a week.  Maintain a healthy weight. The body mass index (BMI) is a screening tool to identify possible weight problems. It provides an estimate of body fat based on height and weight. Your health care provider can find your BMI and can help you achieve or maintain a healthy weight.For adults 20 years and older:  A BMI below 18.5 is considered underweight.  A BMI of 18.5 to 24.9 is normal.  A BMI of 25 to 29.9 is considered overweight.  A  BMI of 30 and above is considered obese.  Maintain normal blood lipids and cholesterol levels by exercising and minimizing your intake of saturated fat. Eat a balanced diet with plenty of fruit and vegetables. Blood tests for lipids and cholesterol should begin at age 45 and be repeated every 5 years. If your lipid or cholesterol levels are high, you are over 50, or you are at high risk for heart disease, you may need your cholesterol levels checked more frequently.Ongoing high lipid and cholesterol levels should be treated with medicines if diet and exercise are not working.  If you smoke, find out from your health care provider how to quit. If you do not use tobacco, do not start.  Lung cancer screening is recommended for adults aged 45-80 years who are at high risk for developing lung cancer because of a history of smoking. A yearly low-dose CT scan of the lungs is recommended for people who have at least a 30-pack-year history of smoking and are a current smoker or have quit within the past 15 years. A pack year of smoking is smoking an average of 1 pack of cigarettes a day for 1 year (for example: 1 pack a day for 30 years or 2 packs a day for 15 years). Yearly screening should continue until the smoker has stopped smoking for at least 15 years. Yearly screening should be stopped for people who develop a health problem that would prevent them from having lung cancer treatment.  If you are pregnant, do not drink alcohol. If you are  breastfeeding, be very cautious about drinking alcohol. If you are not pregnant and choose to drink alcohol, do not have more than 1 drink per day. One drink is considered to be 12 ounces (355 mL) of beer, 5 ounces (148 mL) of wine, or 1.5 ounces (44 mL) of liquor.  Avoid use of street drugs. Do not share needles with anyone. Ask for help if you need support or instructions about stopping the use of drugs.  High blood pressure causes heart disease and increases the risk  of stroke. Your blood pressure should be checked at least every 1 to 2 years. Ongoing high blood pressure should be treated with medicines if weight loss and exercise do not work.  If you are 55-79 years old, ask your health care provider if you should take aspirin to prevent strokes.  Diabetes screening is done by taking a blood sample to check your blood glucose level after you have not eaten for a certain period of time (fasting). If you are not overweight and you do not have risk factors for diabetes, you should be screened once every 3 years starting at age 45. If you are overweight or obese and you are 40-70 years of age, you should be screened for diabetes every year as part of your cardiovascular risk assessment.  Breast cancer screening is essential preventive care for women. You should practice "breast self-awareness." This means understanding the normal appearance and feel of your breasts and may include breast self-examination. Any changes detected, no matter how small, should be reported to a health care provider. Women in their 20s and 30s should have a clinical breast exam (CBE) by a health care provider as part of a regular health exam every 1 to 3 years. After age 40, women should have a CBE every year. Starting at age 40, women should consider having a mammogram (breast X-ray test) every year. Women who have a family history of breast cancer should talk to their health care provider about genetic screening. Women at a high risk of breast cancer should talk to their health care providers about having an MRI and a mammogram every year.  Breast cancer gene (BRCA)-related cancer risk assessment is recommended for women who have family members with BRCA-related cancers. BRCA-related cancers include breast, ovarian, tubal, and peritoneal cancers. Having family members with these cancers may be associated with an increased risk for harmful changes (mutations) in the breast cancer genes BRCA1 and  BRCA2. Results of the assessment will determine the need for genetic counseling and BRCA1 and BRCA2 testing.  Your health care provider may recommend that you be screened regularly for cancer of the pelvic organs (ovaries, uterus, and vagina). This screening involves a pelvic examination, including checking for microscopic changes to the surface of your cervix (Pap test). You may be encouraged to have this screening done every 3 years, beginning at age 21.  For women ages 30-65, health care providers may recommend pelvic exams and Pap testing every 3 years, or they may recommend the Pap and pelvic exam, combined with testing for human papilloma virus (HPV), every 5 years. Some types of HPV increase your risk of cervical cancer. Testing for HPV may also be done on women of any age with unclear Pap test results.  Other health care providers may not recommend any screening for nonpregnant women who are considered low risk for pelvic cancer and who do not have symptoms. Ask your health care provider if a screening pelvic exam is right for   you.  If you have had past treatment for cervical cancer or a condition that could lead to cancer, you need Pap tests and screening for cancer for at least 20 years after your treatment. If Pap tests have been discontinued, your risk factors (such as having a new sexual partner) need to be reassessed to determine if screening should resume. Some women have medical problems that increase the chance of getting cervical cancer. In these cases, your health care provider may recommend more frequent screening and Pap tests.  Colorectal cancer can be detected and often prevented. Most routine colorectal cancer screening begins at the age of 50 years and continues through age 75 years. However, your health care provider may recommend screening at an earlier age if you have risk factors for colon cancer. On a yearly basis, your health care provider may provide home test kits to check  for hidden blood in the stool. Use of a small camera at the end of a tube, to directly examine the colon (sigmoidoscopy or colonoscopy), can detect the earliest forms of colorectal cancer. Talk to your health care provider about this at age 50, when routine screening begins. Direct exam of the colon should be repeated every 5-10 years through age 75 years, unless early forms of precancerous polyps or small growths are found.  People who are at an increased risk for hepatitis B should be screened for this virus. You are considered at high risk for hepatitis B if:  You were born in a country where hepatitis B occurs often. Talk with your health care provider about which countries are considered high risk.  Your parents were born in a high-risk country and you have not received a shot to protect against hepatitis B (hepatitis B vaccine).  You have HIV or AIDS.  You use needles to inject street drugs.  You live with, or have sex with, someone who has hepatitis B.  You get hemodialysis treatment.  You take certain medicines for conditions like cancer, organ transplantation, and autoimmune conditions.  Hepatitis C blood testing is recommended for all people born from 1945 through 1965 and any individual with known risks for hepatitis C.  Practice safe sex. Use condoms and avoid high-risk sexual practices to reduce the spread of sexually transmitted infections (STIs). STIs include gonorrhea, chlamydia, syphilis, trichomonas, herpes, HPV, and human immunodeficiency virus (HIV). Herpes, HIV, and HPV are viral illnesses that have no cure. They can result in disability, cancer, and death.  You should be screened for sexually transmitted illnesses (STIs) including gonorrhea and chlamydia if:  You are sexually active and are younger than 24 years.  You are older than 24 years and your health care provider tells you that you are at risk for this type of infection.  Your sexual activity has changed  since you were last screened and you are at an increased risk for chlamydia or gonorrhea. Ask your health care provider if you are at risk.  If you are at risk of being infected with HIV, it is recommended that you take a prescription medicine daily to prevent HIV infection. This is called preexposure prophylaxis (PrEP). You are considered at risk if:  You are sexually active and do not regularly use condoms or know the HIV status of your partner(s).  You take drugs by injection.  You are sexually active with a partner who has HIV.  Talk with your health care provider about whether you are at high risk of being infected with HIV. If   you choose to begin PrEP, you should first be tested for HIV. You should then be tested every 3 months for as long as you are taking PrEP.  Osteoporosis is a disease in which the bones lose minerals and strength with aging. This can result in serious bone fractures or breaks. The risk of osteoporosis can be identified using a bone density scan. Women ages 67 years and over and women at risk for fractures or osteoporosis should discuss screening with their health care providers. Ask your health care provider whether you should take a calcium supplement or vitamin D to reduce the rate of osteoporosis.  Menopause can be associated with physical symptoms and risks. Hormone replacement therapy is available to decrease symptoms and risks. You should talk to your health care provider about whether hormone replacement therapy is right for you.  Use sunscreen. Apply sunscreen liberally and repeatedly throughout the day. You should seek shade when your shadow is shorter than you. Protect yourself by wearing long sleeves, pants, a wide-brimmed hat, and sunglasses year round, whenever you are outdoors.  Once a month, do a whole body skin exam, using a mirror to look at the skin on your back. Tell your health care provider of new moles, moles that have irregular borders, moles that  are larger than a pencil eraser, or moles that have changed in shape or color.  Stay current with required vaccines (immunizations).  Influenza vaccine. All adults should be immunized every year.  Tetanus, diphtheria, and acellular pertussis (Td, Tdap) vaccine. Pregnant women should receive 1 dose of Tdap vaccine during each pregnancy. The dose should be obtained regardless of the length of time since the last dose. Immunization is preferred during the 27th-36th week of gestation. An adult who has not previously received Tdap or who does not know her vaccine status should receive 1 dose of Tdap. This initial dose should be followed by tetanus and diphtheria toxoids (Td) booster doses every 10 years. Adults with an unknown or incomplete history of completing a 3-dose immunization series with Td-containing vaccines should begin or complete a primary immunization series including a Tdap dose. Adults should receive a Td booster every 10 years.  Varicella vaccine. An adult without evidence of immunity to varicella should receive 2 doses or a second dose if she has previously received 1 dose. Pregnant females who do not have evidence of immunity should receive the first dose after pregnancy. This first dose should be obtained before leaving the health care facility. The second dose should be obtained 4-8 weeks after the first dose.  Human papillomavirus (HPV) vaccine. Females aged 13-26 years who have not received the vaccine previously should obtain the 3-dose series. The vaccine is not recommended for use in pregnant females. However, pregnancy testing is not needed before receiving a dose. If a female is found to be pregnant after receiving a dose, no treatment is needed. In that case, the remaining doses should be delayed until after the pregnancy. Immunization is recommended for any person with an immunocompromised condition through the age of 61 years if she did not get any or all doses earlier. During the  3-dose series, the second dose should be obtained 4-8 weeks after the first dose. The third dose should be obtained 24 weeks after the first dose and 16 weeks after the second dose.  Zoster vaccine. One dose is recommended for adults aged 30 years or older unless certain conditions are present.  Measles, mumps, and rubella (MMR) vaccine. Adults born  before 1957 generally are considered immune to measles and mumps. Adults born in 1957 or later should have 1 or more doses of MMR vaccine unless there is a contraindication to the vaccine or there is laboratory evidence of immunity to each of the three diseases. A routine second dose of MMR vaccine should be obtained at least 28 days after the first dose for students attending postsecondary schools, health care workers, or international travelers. People who received inactivated measles vaccine or an unknown type of measles vaccine during 1963-1967 should receive 2 doses of MMR vaccine. People who received inactivated mumps vaccine or an unknown type of mumps vaccine before 1979 and are at high risk for mumps infection should consider immunization with 2 doses of MMR vaccine. For females of childbearing age, rubella immunity should be determined. If there is no evidence of immunity, females who are not pregnant should be vaccinated. If there is no evidence of immunity, females who are pregnant should delay immunization until after pregnancy. Unvaccinated health care workers born before 1957 who lack laboratory evidence of measles, mumps, or rubella immunity or laboratory confirmation of disease should consider measles and mumps immunization with 2 doses of MMR vaccine or rubella immunization with 1 dose of MMR vaccine.  Pneumococcal 13-valent conjugate (PCV13) vaccine. When indicated, a person who is uncertain of his immunization history and has no record of immunization should receive the PCV13 vaccine. All adults 65 years of age and older should receive this  vaccine. An adult aged 19 years or older who has certain medical conditions and has not been previously immunized should receive 1 dose of PCV13 vaccine. This PCV13 should be followed with a dose of pneumococcal polysaccharide (PPSV23) vaccine. Adults who are at high risk for pneumococcal disease should obtain the PPSV23 vaccine at least 8 weeks after the dose of PCV13 vaccine. Adults older than 80 years of age who have normal immune system function should obtain the PPSV23 vaccine dose at least 1 year after the dose of PCV13 vaccine.  Pneumococcal polysaccharide (PPSV23) vaccine. When PCV13 is also indicated, PCV13 should be obtained first. All adults aged 65 years and older should be immunized. An adult younger than age 65 years who has certain medical conditions should be immunized. Any person who resides in a nursing home or long-term care facility should be immunized. An adult smoker should be immunized. People with an immunocompromised condition and certain other conditions should receive both PCV13 and PPSV23 vaccines. People with human immunodeficiency virus (HIV) infection should be immunized as soon as possible after diagnosis. Immunization during chemotherapy or radiation therapy should be avoided. Routine use of PPSV23 vaccine is not recommended for American Indians, [place] Natives, or people younger than 65 years unless there are medical conditions that require PPSV23 vaccine. When indicated, people who have unknown immunization and have no record of immunization should receive PPSV23 vaccine. One-time revaccination 5 years after the first dose of PPSV23 is recommended for people aged 19-64 years who have chronic kidney failure, nephrotic syndrome, asplenia, or immunocompromised conditions. People who received 1-2 doses of PPSV23 before age 65 years should receive another dose of PPSV23 vaccine at age 65 years or later if at least 5 years have passed since the previous dose. Doses of PPSV23 are not  needed for people immunized with PPSV23 at or after age 65 years.  Meningococcal vaccine. Adults with asplenia or persistent complement component deficiencies should receive 2 doses of quadrivalent meningococcal conjugate (MenACWY-D) vaccine. The doses should be obtained   at least 2 months apart. Microbiologists working with certain meningococcal bacteria, [place] recruits, people at risk during an outbreak, and people who travel to or live in countries with a high rate of meningitis should be immunized. A first-year college student up through age 34 years who is living in a residence hall should receive a dose if she did not receive a dose on or after her 16th birthday. Adults who have certain high-risk conditions should receive one or more doses of vaccine.  Hepatitis A vaccine. Adults who wish to be protected from this disease, have certain high-risk conditions, work with hepatitis A-infected animals, work in hepatitis A research labs, or travel to or work in countries with a high rate of hepatitis A should be immunized. Adults who were previously unvaccinated and who anticipate close contact with an international adoptee during the first 60 days after arrival in the [place] from a country with a high rate of hepatitis A should be immunized.  Hepatitis B vaccine. Adults who wish to be protected from this disease, have certain high-risk conditions, may be exposed to blood or other infectious body fluids, are household contacts or sex partners of hepatitis B positive people, are clients or workers in certain care facilities, or travel to or work in countries with a high rate of hepatitis B should be immunized.  Haemophilus influenzae type b (Hib) vaccine. A previously unvaccinated person with asplenia or sickle cell disease or having a scheduled splenectomy should receive 1 dose of Hib vaccine. Regardless of previous immunization, a recipient of a hematopoietic stem cell transplant should receive a  3-dose series 6-12 months after her successful transplant. Hib vaccine is not recommended for adults with HIV infection. Preventive Services / Frequency Ages 35 to 4 years  Blood pressure check.** / Every 3-5 years.  Lipid and cholesterol check.** / Every 5 years beginning at age 60.  Clinical breast exam.** / Every 3 years for women in their 71s and 10s.  BRCA-related cancer risk assessment.** / For women who have family members with a BRCA-related cancer (breast, ovarian, tubal, or peritoneal cancers).  Pap test.** / Every 2 years from ages 76 through 26. Every 3 years starting at age 61 through age 76 or 93 with a history of 3 consecutive normal Pap tests.  HPV screening.** / Every 3 years from ages 37 through ages 60 to 51 with a history of 3 consecutive normal Pap tests.  Hepatitis C blood test.** / For any individual with known risks for hepatitis C.  Skin self-exam. / Monthly.  Influenza vaccine. / Every year.  Tetanus, diphtheria, and acellular pertussis (Tdap, Td) vaccine.** / Consult your health care provider. Pregnant women should receive 1 dose of Tdap vaccine during each pregnancy. 1 dose of Td every 10 years.  Varicella vaccine.** / Consult your health care provider. Pregnant females who do not have evidence of immunity should receive the first dose after pregnancy.  HPV vaccine. / 3 doses over 6 months, if 93 and younger. The vaccine is not recommended for use in pregnant females. However, pregnancy testing is not needed before receiving a dose.  Measles, mumps, rubella (MMR) vaccine.** / You need at least 1 dose of MMR if you were born in 1957 or later. You may also need a 2nd dose. For females of childbearing age, rubella immunity should be determined. If there is no evidence of immunity, females who are not pregnant should be vaccinated. If there is no evidence of immunity, females who are  pregnant should delay immunization until after pregnancy.  Pneumococcal  13-valent conjugate (PCV13) vaccine.** / Consult your health care provider.  Pneumococcal polysaccharide (PPSV23) vaccine.** / 1 to 2 doses if you smoke cigarettes or if you have certain conditions.  Meningococcal vaccine.** / 1 dose if you are age 68 to 8 years and a Market researcher living in a residence hall, or have one of several medical conditions, you need to get vaccinated against meningococcal disease. You may also need additional booster doses.  Hepatitis A vaccine.** / Consult your health care provider.  Hepatitis B vaccine.** / Consult your health care provider.  Haemophilus influenzae type b (Hib) vaccine.** / Consult your health care provider. Ages 7 to 53 years  Blood pressure check.** / Every year.  Lipid and cholesterol check.** / Every 5 years beginning at age 25 years.  Lung cancer screening. / Every year if you are aged 11-80 years and have a 30-pack-year history of smoking and currently smoke or have quit within the past 15 years. Yearly screening is stopped once you have quit smoking for at least 15 years or develop a health problem that would prevent you from having lung cancer treatment.  Clinical breast exam.** / Every year after age 48 years.  BRCA-related cancer risk assessment.** / For women who have family members with a BRCA-related cancer (breast, ovarian, tubal, or peritoneal cancers).  Mammogram.** / Every year beginning at age 41 years and continuing for as long as you are in good health. Consult with your health care provider.  Pap test.** / Every 3 years starting at age 65 years through age 37 or 70 years with a history of 3 consecutive normal Pap tests.  HPV screening.** / Every 3 years from ages 72 years through ages 60 to 40 years with a history of 3 consecutive normal Pap tests.  Fecal occult blood test (FOBT) of stool. / Every year beginning at age 21 years and continuing until age 5 years. You may not need to do this test if you get  a colonoscopy every 10 years.  Flexible sigmoidoscopy or colonoscopy.** / Every 5 years for a flexible sigmoidoscopy or every 10 years for a colonoscopy beginning at age 35 years and continuing until age 48 years.  Hepatitis C blood test.** / For all people born from [birth year] through 1965 and any individual with known risks for hepatitis C.  Skin self-exam. / Monthly.  Influenza vaccine. / Every year.  Tetanus, diphtheria, and acellular pertussis (Tdap/Td) vaccine.** / Consult your health care provider. Pregnant women should receive 1 dose of Tdap vaccine during each pregnancy. 1 dose of Td every 10 years.  Varicella vaccine.** / Consult your health care provider. Pregnant females who do not have evidence of immunity should receive the first dose after pregnancy.  Zoster vaccine.** / 1 dose for adults aged 30 years or older.  Measles, mumps, rubella (MMR) vaccine.** / You need at least 1 dose of MMR if you were born in 1957 or later. You may also need a second dose. For females of childbearing age, rubella immunity should be determined. If there is no evidence of immunity, females who are not pregnant should be vaccinated. If there is no evidence of immunity, females who are pregnant should delay immunization until after pregnancy.  Pneumococcal 13-valent conjugate (PCV13) vaccine.** / Consult your health care provider.  Pneumococcal polysaccharide (PPSV23) vaccine.** / 1 to 2 doses if you smoke cigarettes or if you have certain conditions.  Meningococcal vaccine.** /  Consult your health care provider.  Hepatitis A vaccine.** / Consult your health care provider.  Hepatitis B vaccine.** / Consult your health care provider.  Haemophilus influenzae type b (Hib) vaccine.** / Consult your health care provider. Ages 65 years and over  Blood pressure check.** / Every year.  Lipid and cholesterol check.** / Every 5 years beginning at age 20 years.  Lung cancer screening. / Every year if you  are aged 55-80 years and have a 30-pack-year history of smoking and currently smoke or have quit within the past 15 years. Yearly screening is stopped once you have quit smoking for at least 15 years or develop a health problem that would prevent you from having lung cancer treatment.  Clinical breast exam.** / Every year after age 40 years.  BRCA-related cancer risk assessment.** / For women who have family members with a BRCA-related cancer (breast, ovarian, tubal, or peritoneal cancers).  Mammogram.** / Every year beginning at age 40 years and continuing for as long as you are in good health. Consult with your health care provider.  Pap test.** / Every 3 years starting at age 30 years through age 65 or 70 years with 3 consecutive normal Pap tests. Testing can be stopped between 65 and 70 years with 3 consecutive normal Pap tests and no abnormal Pap or HPV tests in the past 10 years.  HPV screening.** / Every 3 years from ages 30 years through ages 65 or 70 years with a history of 3 consecutive normal Pap tests. Testing can be stopped between 65 and 70 years with 3 consecutive normal Pap tests and no abnormal Pap or HPV tests in the past 10 years.  Fecal occult blood test (FOBT) of stool. / Every year beginning at age 50 years and continuing until age 75 years. You may not need to do this test if you get a colonoscopy every 10 years.  Flexible sigmoidoscopy or colonoscopy.** / Every 5 years for a flexible sigmoidoscopy or every 10 years for a colonoscopy beginning at age 50 years and continuing until age 75 years.  Hepatitis C blood test.** / For all people born from 1945 through 1965 and any individual with known risks for hepatitis C.  Osteoporosis screening.** / A one-time screening for women ages 65 years and over and women at risk for fractures or osteoporosis.  Skin self-exam. / Monthly.  Influenza vaccine. / Every year.  Tetanus, diphtheria, and acellular pertussis (Tdap/Td)  vaccine.** / 1 dose of Td every 10 years.  Varicella vaccine.** / Consult your health care provider.  Zoster vaccine.** / 1 dose for adults aged 60 years or older.  Pneumococcal 13-valent conjugate (PCV13) vaccine.** / Consult your health care provider.  Pneumococcal polysaccharide (PPSV23) vaccine.** / 1 dose for all adults aged 65 years and older.  Meningococcal vaccine.** / Consult your health care provider.  Hepatitis A vaccine.** / Consult your health care provider.  Hepatitis B vaccine.** / Consult your health care provider.  Haemophilus influenzae type b (Hib) vaccine.** / Consult your health care provider. ** Family history and personal history of risk and conditions may change your health care provider's recommendations.   This information is not intended to replace advice given to you by your health care provider. Make sure you discuss any questions you have with your health care provider.   Document Released: 07/26/2001 Document Revised: 06/20/2014 Document Reviewed: 10/25/2010 Elsevier Interactive Patient Education 2016 Elsevier Inc.  

## 2015-12-22 NOTE — Progress Notes (Signed)
Pre visit review using our clinic review tool, if applicable. No additional management support is needed unless otherwise documented below in the visit note. 

## 2015-12-23 ENCOUNTER — Telehealth: Payer: Self-pay | Admitting: Family Medicine

## 2015-12-23 DIAGNOSIS — I10 Essential (primary) hypertension: Secondary | ICD-10-CM

## 2015-12-23 LAB — CA 125: CA 125: 19 U/mL (ref <35)

## 2015-12-23 MED ORDER — TERAZOSIN HCL 10 MG PO CAPS: ORAL_CAPSULE | ORAL | Status: DC

## 2015-12-23 MED ORDER — ATENOLOL 50 MG PO TABS: 75.0000 mg | ORAL_TABLET | Freq: Every day | ORAL | Status: DC

## 2015-12-23 NOTE — Telephone Encounter (Signed)
Called patient to verify which meds needed to be sent to [facility].  Pt stated Atenolol and Terazosin.  Both were sent to [facility] as requested.  Called [facility] and cancelled the two prescriptions there.

## 2015-12-23 NOTE — Telephone Encounter (Signed)
Caller name: Relationship to patient: Self Can be reached: [phone] Pharmacy:  [place], [address] [phone] (Phone) [phone] (Fax)         Reason for call:Patient states that her Rx were sent to the wrong pharmacy on yesterday. Need them sent to [facility]

## 2015-12-24 LAB — URINE CULTURE

## 2016-02-23 DIAGNOSIS — H40011 Open angle with borderline findings, low risk, right eye: Secondary | ICD-10-CM | POA: Diagnosis not present

## 2016-02-23 DIAGNOSIS — H40012 Open angle with borderline findings, low risk, left eye: Secondary | ICD-10-CM | POA: Diagnosis not present

## 2016-03-28 ENCOUNTER — Ambulatory Visit (INDEPENDENT_AMBULATORY_CARE_PROVIDER_SITE_OTHER): Payer: Medicare Other

## 2016-03-28 DIAGNOSIS — Z23 Encounter for immunization: Secondary | ICD-10-CM | POA: Diagnosis not present

## 2016-04-07 ENCOUNTER — Other Ambulatory Visit: Payer: Self-pay | Admitting: Family Medicine

## 2016-04-07 DIAGNOSIS — I10 Essential (primary) hypertension: Secondary | ICD-10-CM

## 2016-04-12 ENCOUNTER — Telehealth: Payer: Self-pay | Admitting: Family Medicine

## 2016-04-12 NOTE — Telephone Encounter (Signed)
Relation to [id number] Call back [phone] Pharmacy: [facility], [address] AT [facility] [address] [place] [phone] (Phone) [phone] (Fax)     Reason for call:  Patient states mail order and retail [facility]) currently out of atenolol (TENORMIN) 50 MG tablet until December (Rx is on backorder), patient requesting clinical advice or an alternate medication. Please advise

## 2016-04-14 MED ORDER — METOPROLOL SUCCINATE ER 50 MG PO TB24: 50.0000 mg | ORAL_TABLET | Freq: Every day | ORAL | 2 refills | Status: DC

## 2016-04-14 NOTE — Telephone Encounter (Signed)
Change to Toprol xl 50 mg #30  2 refills---  Ov to check bp in 2-3 weeks

## 2016-04-14 NOTE — Telephone Encounter (Signed)
Ok per PCP request.

## 2016-04-14 NOTE — Telephone Encounter (Signed)
Patient is calling regarding that status of this medication.

## 2016-04-14 NOTE — Telephone Encounter (Signed)
Patient aware. Patient scheduled appt for 04/28/16. I scheduled in a same day spot because Dr. Nonda Lou next follow up aptt is in Dec. Please advise if I need to change.

## 2016-04-14 NOTE — Telephone Encounter (Signed)
New medication filled to pharmacy as requested. Please call pt to schedule OV w/ PCP in 2-3 weeks for BP recheck.

## 2016-04-14 NOTE — Telephone Encounter (Signed)
Please advise if any alternative med.

## 2016-04-25 ENCOUNTER — Telehealth: Payer: Self-pay | Admitting: Family Medicine

## 2016-04-25 NOTE — Telephone Encounter (Signed)
Patient has an appt schedule on 04/28/16 for a blood pressure check she would like to know if Dr. Etter Sjogren- Cheri Rous still needs to see her since she got her regular BP medication? Please advise.

## 2016-04-26 MED ORDER — ATENOLOL 50 MG PO TABS: ORAL_TABLET | ORAL | 0 refills | Status: DC

## 2016-04-26 NOTE — Telephone Encounter (Signed)
Spoke with pt. She states that she just received her old medication "atenolol 50mg , 1 1/2 tablets by mouth daily" from  Mail order and she has restarted those. No longer taking metoprolol. Pt cancelled appt for tomorrow since it was a follow up of the change to metoprolol.

## 2016-04-28 ENCOUNTER — Ambulatory Visit: Payer: Medicare Other | Admitting: Family Medicine

## 2016-05-24 DIAGNOSIS — H40011 Open angle with borderline findings, low risk, right eye: Secondary | ICD-10-CM | POA: Diagnosis not present

## 2016-05-24 DIAGNOSIS — H40012 Open angle with borderline findings, low risk, left eye: Secondary | ICD-10-CM | POA: Diagnosis not present

## 2016-07-04 ENCOUNTER — Other Ambulatory Visit: Payer: Self-pay | Admitting: Family Medicine

## 2016-08-29 ENCOUNTER — Other Ambulatory Visit: Payer: Self-pay | Admitting: Family Medicine

## 2016-08-29 DIAGNOSIS — R42 Dizziness and giddiness: Secondary | ICD-10-CM

## 2016-09-30 ENCOUNTER — Other Ambulatory Visit: Payer: Self-pay | Admitting: *Deleted

## 2016-09-30 ENCOUNTER — Telehealth: Payer: Self-pay | Admitting: Family Medicine

## 2016-09-30 DIAGNOSIS — Z1231 Encounter for screening mammogram for malignant neoplasm of breast: Secondary | ICD-10-CM

## 2016-09-30 DIAGNOSIS — E2839 Other primary ovarian failure: Secondary | ICD-10-CM

## 2016-09-30 MED ORDER — ATENOLOL 50 MG PO TABS: 75.0000 mg | ORAL_TABLET | Freq: Every day | ORAL | 1 refills | Status: DC

## 2016-09-30 NOTE — Telephone Encounter (Signed)
Orders placed and referral coordinators will fax them to [place]. Notified pt.

## 2016-09-30 NOTE — Telephone Encounter (Signed)
Caller name: Maryanna Relationship to patient: self Can be reached: [phone]  Reason for call: Pt called stating she is due for Mammogram and Bone Density in May and would like orders sent to [facility] [address] in [place]. Please enter orders.

## 2016-10-13 ENCOUNTER — Telehealth: Payer: Self-pay | Admitting: Family Medicine

## 2016-10-13 NOTE — Telephone Encounter (Signed)
°  Relation to [id number] Call back number:[phone]   Reason for call:  Patient requesting Handicap Parking, please advise

## 2016-10-13 NOTE — Telephone Encounter (Signed)
Ok to give her one.

## 2016-10-13 NOTE — Telephone Encounter (Signed)
Completed and called the patient to inform done. She will pickup at the front desk.

## 2016-10-25 DIAGNOSIS — M85852 Other specified disorders of bone density and structure, left thigh: Secondary | ICD-10-CM | POA: Diagnosis not present

## 2016-10-25 DIAGNOSIS — M85832 Other specified disorders of bone density and structure, left forearm: Secondary | ICD-10-CM | POA: Diagnosis not present

## 2016-10-25 DIAGNOSIS — Z1231 Encounter for screening mammogram for malignant neoplasm of breast: Secondary | ICD-10-CM | POA: Diagnosis not present

## 2016-10-25 DIAGNOSIS — Z780 Asymptomatic menopausal state: Secondary | ICD-10-CM | POA: Diagnosis not present

## 2016-10-25 DIAGNOSIS — M8588 Other specified disorders of bone density and structure, other site: Secondary | ICD-10-CM | POA: Diagnosis not present

## 2016-10-25 DIAGNOSIS — M810 Age-related osteoporosis without current pathological fracture: Secondary | ICD-10-CM | POA: Diagnosis not present

## 2016-10-25 LAB — HM DEXA SCAN

## 2016-10-25 LAB — HM MAMMOGRAPHY

## 2016-11-01 DIAGNOSIS — H40013 Open angle with borderline findings, low risk, bilateral: Secondary | ICD-10-CM | POA: Diagnosis not present

## 2016-11-02 ENCOUNTER — Encounter: Payer: Self-pay | Admitting: *Deleted

## 2016-11-02 ENCOUNTER — Telehealth: Payer: Self-pay | Admitting: *Deleted

## 2016-11-02 NOTE — Telephone Encounter (Signed)
Patient notified of results of DEXA scan and mammogram.  Mammogram was negative and Bone Density show no changes and Dr. Etter Sjogren would like for patient to continue Vitamin D and Ca.

## 2016-11-17 ENCOUNTER — Other Ambulatory Visit: Payer: Self-pay | Admitting: Family Medicine

## 2016-11-17 DIAGNOSIS — I10 Essential (primary) hypertension: Secondary | ICD-10-CM

## 2016-12-02 DIAGNOSIS — H52203 Unspecified astigmatism, bilateral: Secondary | ICD-10-CM | POA: Diagnosis not present

## 2016-12-02 DIAGNOSIS — H2513 Age-related nuclear cataract, bilateral: Secondary | ICD-10-CM | POA: Diagnosis not present

## 2016-12-02 DIAGNOSIS — H43813 Vitreous degeneration, bilateral: Secondary | ICD-10-CM | POA: Diagnosis not present

## 2016-12-02 DIAGNOSIS — H40013 Open angle with borderline findings, low risk, bilateral: Secondary | ICD-10-CM | POA: Diagnosis not present

## 2016-12-29 ENCOUNTER — Ambulatory Visit (INDEPENDENT_AMBULATORY_CARE_PROVIDER_SITE_OTHER): Payer: Medicare Other | Admitting: Family Medicine

## 2016-12-29 VITALS — BP 188/81 | HR 52 | Temp 98.4°F | Ht 60.0 in | Wt 117.1 lb

## 2016-12-29 DIAGNOSIS — Z0000 Encounter for general adult medical examination without abnormal findings: Secondary | ICD-10-CM

## 2016-12-29 DIAGNOSIS — I10 Essential (primary) hypertension: Secondary | ICD-10-CM

## 2016-12-29 LAB — COMPREHENSIVE METABOLIC PANEL
ALBUMIN: 3.9 g/dL (ref 3.5–5.2)
ALK PHOS: 51 U/L (ref 39–117)
ALT: 16 U/L (ref 0–35)
AST: 23 U/L (ref 0–37)
BILIRUBIN TOTAL: 0.5 mg/dL (ref 0.2–1.2)
BUN: 15 mg/dL (ref 6–23)
CALCIUM: 9.2 mg/dL (ref 8.4–10.5)
CHLORIDE: 102 meq/L (ref 96–112)
CO2: 30 mEq/L (ref 19–32)
CREATININE: 0.64 mg/dL (ref 0.40–1.20)
GFR: 93.76 mL/min (ref >60.00)
Glucose, Bld: 101 mg/dL — ABNORMAL HIGH (ref 70–99)
Potassium: 3.8 mEq/L (ref 3.5–5.1)
SODIUM: 138 meq/L (ref 135–145)
TOTAL PROTEIN: 6.5 g/dL (ref 6.0–8.3)

## 2016-12-29 LAB — CBC WITH DIFFERENTIAL/PLATELET
BASOS PCT: 0.9 % (ref 0.0–3.0)
Basophils Absolute: 0 10*3/uL (ref 0.0–0.1)
EOS ABS: 0.1 10*3/uL (ref 0.0–0.7)
Eosinophils Relative: 1.4 % (ref 0.0–5.0)
HCT: 38.7 % (ref 36.0–46.0)
HEMOGLOBIN: 12.8 g/dL (ref 12.0–15.0)
LYMPHS ABS: 0.9 10*3/uL (ref 0.7–4.0)
Lymphocytes Relative: 20.5 % (ref 12.0–46.0)
MCHC: 33 g/dL (ref 30.0–36.0)
MCV: 90.8 fl (ref 78.0–100.0)
MONO ABS: 0.3 10*3/uL (ref 0.1–1.0)
Monocytes Relative: 8.2 % (ref 3.0–12.0)
NEUTROS PCT: 69 % (ref 43.0–77.0)
Neutro Abs: 2.9 10*3/uL (ref 1.4–7.7)
Platelets: 203 10*3/uL (ref 150.0–400.0)
RBC: 4.26 Mil/uL (ref 3.87–5.11)
RDW: 14.4 % (ref 11.5–15.5)
WBC: 4.2 10*3/uL (ref 4.0–10.5)

## 2016-12-29 LAB — LIPID PANEL
CHOLESTEROL: 168 mg/dL (ref 0–200)
HDL: 76.9 mg/dL (ref >39.00)
LDL CALC: 77 mg/dL (ref 0–99)
NonHDL: 91.23
TRIGLYCERIDES: 72 mg/dL (ref 0.0–149.0)
Total CHOL/HDL Ratio: 2
VLDL: 14.4 mg/dL (ref 0.0–40.0)

## 2016-12-29 MED ORDER — ATENOLOL 50 MG PO TABS: 75.0000 mg | ORAL_TABLET | Freq: Every day | ORAL | 1 refills | Status: DC

## 2016-12-29 MED ORDER — LISINOPRIL 10 MG PO TABS: 10.0000 mg | ORAL_TABLET | Freq: Every day | ORAL | 3 refills | Status: DC

## 2016-12-29 MED ORDER — TERAZOSIN HCL 10 MG PO CAPS: 10.0000 mg | ORAL_CAPSULE | Freq: Every day | ORAL | 3 refills | Status: DC

## 2016-12-29 NOTE — Patient Instructions (Signed)
Preventive Care 81 Years and Older, Female Preventive care refers to lifestyle choices and visits with your health care provider that can promote health and wellness. What does preventive care include?  A yearly physical exam. This is also called an annual well check.  Dental exams once or twice a year.  Routine eye exams. Ask your health care provider how often you should have your eyes checked.  Personal lifestyle choices, including: ? Daily care of your teeth and gums. ? Regular physical activity. ? Eating a healthy diet. ? Avoiding tobacco and drug use. ? Limiting alcohol use. ? Practicing safe sex. ? Taking low-dose aspirin every day. ? Taking vitamin and mineral supplements as recommended by your health care provider. What happens during an annual well check? The services and screenings done by your health care provider during your annual well check will depend on your age, overall health, lifestyle risk factors, and family history of disease. Counseling Your health care provider may ask you questions about your:  Alcohol use.  Tobacco use.  Drug use.  Emotional well-being.  Home and relationship well-being.  Sexual activity.  Eating habits.  History of falls.  Memory and ability to understand (cognition).  Work and work environment.  Reproductive health.  Screening You may have the following tests or measurements:  Height, weight, and BMI.  Blood pressure.  Lipid and cholesterol levels. These may be checked every 5 years, or more frequently if you are over 50 years old.  Skin check.  Lung cancer screening. You may have this screening every year starting at age 55 if you have a 30-pack-year history of smoking and currently smoke or have quit within the past 15 years.  Fecal occult blood test (FOBT) of the stool. You may have this test every year starting at age 50.  Flexible sigmoidoscopy or colonoscopy. You may have a sigmoidoscopy every 5 years or  a colonoscopy every 10 years starting at age 50.  Hepatitis C blood test.  Hepatitis B blood test.  Sexually transmitted disease (STD) testing.  Diabetes screening. This is done by checking your blood sugar (glucose) after you have not eaten for a while (fasting). You may have this done every 1-3 years.  Bone density scan. This is done to screen for osteoporosis. You may have this done starting at age 65.  Mammogram. This may be done every 1-2 years. Talk to your health care provider about how often you should have regular mammograms.  Talk with your health care provider about your test results, treatment options, and if necessary, the need for more tests. Vaccines Your health care provider may recommend certain vaccines, such as:  Influenza vaccine. This is recommended every year.  Tetanus, diphtheria, and acellular pertussis (Tdap, Td) vaccine. You may need a Td booster every 10 years.  Varicella vaccine. You may need this if you have not been vaccinated.  Zoster vaccine. You may need this after age 60.  Measles, mumps, and rubella (MMR) vaccine. You may need at least one dose of MMR if you were born in 1957 or later. You may also need a second dose.  Pneumococcal 13-valent conjugate (PCV13) vaccine. One dose is recommended after age 65.  Pneumococcal polysaccharide (PPSV23) vaccine. One dose is recommended after age 65.  Meningococcal vaccine. You may need this if you have certain conditions.  Hepatitis A vaccine. You may need this if you have certain conditions or if you travel or work in places where you may be exposed to hepatitis   A.  Hepatitis B vaccine. You may need this if you have certain conditions or if you travel or work in places where you may be exposed to hepatitis B.  Haemophilus influenzae type b (Hib) vaccine. You may need this if you have certain conditions.  Talk to your health care provider about which screenings and vaccines you need and how often you  need them. This information is not intended to replace advice given to you by your health care provider. Make sure you discuss any questions you have with your health care provider. Document Released: 06/26/2015 Document Revised: 02/17/2016 Document Reviewed: 03/31/2015 Elsevier Interactive Patient Education  2017 [employer].

## 2016-12-29 NOTE — Progress Notes (Signed)
Pre visit review using our clinic review tool, if applicable. No additional management support is needed unless otherwise documented below in the visit note. 

## 2016-12-29 NOTE — Progress Notes (Signed)
Subjective:     Cassidy Brown is a 83 y.o. female and is here for a comprehensive physical exam. The patient reports problems - bp running high at home as well.  she also c/o eczema on back.  Social History   Social History  . Marital status: Married    Spouse name: N/A  . Number of children: N/A  . Years of education: N/A   Occupational History  . [employer] --retired Retired   Social History Main Topics  . Smoking status: Never Smoker  . Smokeless tobacco: Not on file  . Alcohol use No  . Drug use: No  . Sexual activity: Yes    Partners: Male   Other Topics Concern  . Not on file   Social History Narrative   Exercise-- walking 1 x a week   Health Maintenance  Topic Date Due  . TETANUS/TDAP  02/04/1949  . INFLUENZA VACCINE  01/11/2017  . MAMMOGRAM  10/25/2017  . DEXA SCAN  10/26/2018  . PNA vac Low Risk Adult  Completed    The following portions of the patient's history were reviewed and updated as appropriate:  She  has a past medical history of Allergy; Cancer ([place]); Diverticulosis; Hypertension; Osteoporosis; Poliomyelitis; and Vertigo. She  does not have any pertinent problems on file. She  has a past surgical history that includes Breast surgery; Lumbar laminectomy; Dilation and curettage of uterus; ganglion cyst r wrist; and Spine surgery (11/2010). Her family history includes Coronary artery disease in her other; Hypertension in her father and sister; Kidney disease in her father. She  reports that she has never smoked. She does not have any smokeless tobacco history on file. She reports that she does not drink alcohol or use drugs. She has a current medication list which includes the following prescription(s): atenolol, b complex vitamins, calcium citrate-vitamin d, cholecalciferol, diptheria-tetanus toxoids, ginkgo biloba, glucosamine-chondroitin, latanoprost, loratadine, lorazepam, magnesium, meclizine, meloxicam, misc natural products,  multivitamin-lutein, pseudoephedrine, terazosin, and lisinopril. Current Outpatient Prescriptions on File Prior to Visit  Medication Sig Dispense Refill  . b complex vitamins tablet Take 1 tablet by mouth daily.    . calcium citrate-vitamin D (CITRACAL+D) 315-200 MG-UNIT per tablet Take 1 tablet by mouth 3 (three) times daily.    . cholecalciferol (VITAMIN D) 1000 UNITS tablet Take 1,000 Units by mouth daily.      Marland Kitchen diptheria-tetanus toxoids (DECAVAC) 2-2 LF/0.5ML injection Inject 0.5 mLs into the muscle once. 0.5 mL 0  . Ginkgo Biloba (GINKGO PO) Take 1 tablet by mouth daily.    Marland Kitchen glucosamine-chondroitin 500-400 MG tablet Take 1 tablet by mouth 3 (three) times daily.    Marland Kitchen loratadine (CLARITIN) 10 MG tablet Take 10 mg by mouth daily.      Marland Kitchen LORazepam (ATIVAN) 1 MG tablet TAKE 1 TABLET EVERY 8 HOURS 60 tablet 0  . MAGNESIUM PO Take 500 mg by mouth daily.     . meclizine (ANTIVERT) 25 MG tablet TAKE 1 TABLET BY MOUTH 3 TIMES A DAY AS NEEDED 60 tablet 0  . meloxicam (MOBIC) 15 MG tablet TAKE 1/2 TO 1 TABLET BY  MOUTH EVERY DAY AS NEEDED 90 tablet 2  . Misc Natural Products (LUTEIN 20 PO) Take 1 capsule by mouth daily.    . multivitamin-lutein (OCUVITE-LUTEIN) CAPS Take 1 capsule by mouth daily.    . pseudoephedrine (SUDAFED) 30 MG tablet Take 30 mg by mouth as needed.       No current facility-administered medications on  file prior to visit.    She is allergic to amoxicillin; hydrocodone; plendil [felodipine]; propoxyphene n-acetaminophen; and tenex [guanfacine hcl]..  Review of Systems Review of Systems  Constitutional: Negative for activity change, appetite change and fatigue.  HENT: Negative for hearing loss, congestion, tinnitus and ear discharge.  dentist q50m Eyes: Negative for visual disturbance (see optho q1y -- vision corrected to 20/20 with glasses).  Respiratory: Negative for cough, chest tightness and shortness of breath.   Cardiovascular: Negative for chest pain, palpitations and  leg swelling.  Gastrointestinal: Negative for abdominal pain, diarrhea, constipation and abdominal distention.  Genitourinary: Negative for urgency, frequency, decreased urine volume and difficulty urinating.  Musculoskeletal: Negative for back pain, arthralgias and gait problem.  Skin: Negative for color change, pallor and rash.  Neurological: Negative for dizziness, light-headedness, numbness and headaches.  Hematological: Negative for adenopathy. Does not bruise/bleed easily.  Psychiatric/Behavioral: Negative for suicidal ideas, confusion, sleep disturbance, self-injury, dysphoric mood, decreased concentration and agitation.      Objective:    BP (!) 188/81 (BP Location: Right Arm, Patient Position: Sitting, Cuff Size: Normal)   Pulse (!) 52   Temp 98.4 F (36.9 C) (Oral)   Ht 5' (1.524 m)   Wt 117 lb 2 oz (53.1 kg)   SpO2 99%   BMI 22.87 kg/m  General appearance: alert, cooperative, appears stated age and no distress Head: Normocephalic, without obvious abnormality, atraumatic Eyes: conjunctivae/corneas clear. PERRL, EOM's intact. Fundi benign. Ears: normal TM's and external ear canals both ears Nose: Nares normal. Septum midline. Mucosa normal. No drainage or sinus tenderness. Throat: lips, mucosa, and tongue normal; teeth and gums normal Neck: no adenopathy, no carotid bruit, no JVD, supple, symmetrical, trachea midline and thyroid not enlarged, symmetric, no tenderness/mass/nodules Back: symmetric, no curvature. ROM normal. No CVA tenderness. Lungs: clear to auscultation bilaterally Breasts: normal appearance, no masses or tenderness Heart: regular rate and rhythm, S1, S2 normal, no murmur, click, rub or gallop Abdomen: soft, non-tender; bowel sounds normal; no masses,  no organomegaly Pelvic: deferred Extremities: extremities normal, atraumatic, no cyanosis or edema Pulses: 2+ and symmetric Skin: Skin color, texture, turgor normal. No rashes or lesions Lymph nodes:  Cervical, supraclavicular, and axillary nodes normal. Neurologic: Alert and oriented X 3, normal strength and tone. Normal symmetric reflexes. Normal coordination and gait    Assessment:    Healthy female exam.     Plan:    ghm utd Check labs See After Visit Summary for Counseling  Recommendations    1. Essential hypertension Poorly controlled will alter medications, encouraged DASH diet, minimize caffeine and obtain adequate sleep. Report concerning symptoms and follow up as directed and as needed - lisinopril (PRINIVIL,ZESTRIL) 10 MG tablet; Take 1 tablet (10 mg total) by mouth daily.  Dispense: 90 tablet; Refill: 3 - atenolol (TENORMIN) 50 MG tablet; Take 1.5 tablets (75 mg total) by mouth daily.  Dispense: 135 tablet; Refill: 1 - terazosin (HYTRIN) 10 MG capsule; Take 1 capsule (10 mg total) by mouth at bedtime.  Dispense: 90 capsule; Refill: 3 - CBC with Differential/Platelet - Lipid panel - Comprehensive metabolic panel  2. Preventative health care ghm utd Check labs See AVS D/w pt shingrix

## 2017-01-01 ENCOUNTER — Encounter: Payer: Self-pay | Admitting: Family Medicine

## 2017-01-05 DIAGNOSIS — H2512 Age-related nuclear cataract, left eye: Secondary | ICD-10-CM | POA: Diagnosis not present

## 2017-01-05 DIAGNOSIS — H25812 Combined forms of age-related cataract, left eye: Secondary | ICD-10-CM | POA: Diagnosis not present

## 2017-01-05 DIAGNOSIS — H25012 Cortical age-related cataract, left eye: Secondary | ICD-10-CM | POA: Diagnosis not present

## 2017-01-12 ENCOUNTER — Telehealth: Payer: Self-pay | Admitting: Family Medicine

## 2017-01-12 NOTE — Telephone Encounter (Signed)
OK to take 2 tabs daily. Schedule in 1-2 weeks w Dr. Etter Sjogren to recheck BP and electrolytes. TY.

## 2017-01-12 NOTE — Telephone Encounter (Signed)
Patient notified.  She made appt for 01/26/17 to follow up with Dr. Etter Sjogren.  She has enough medication to cover her til appt.

## 2017-01-12 NOTE — Telephone Encounter (Signed)
Caller name: Roshawnda Pecora Relationship to patient: self Can be reached: [phone] Pharmacy: [place], [address] AT [address]  Reason for call: Pt states she recently added Lisinopril. She said it isn't working. She asked if she can double it. Last week of BP 156/162/158/165/157/178/168/179. Please call.

## 2017-01-26 ENCOUNTER — Encounter: Payer: Self-pay | Admitting: Family Medicine

## 2017-01-26 ENCOUNTER — Ambulatory Visit (INDEPENDENT_AMBULATORY_CARE_PROVIDER_SITE_OTHER): Payer: Medicare Other | Admitting: Family Medicine

## 2017-01-26 VITALS — BP 172/88 | HR 62 | Temp 98.3°F | Ht 60.0 in | Wt 117.2 lb

## 2017-01-26 DIAGNOSIS — I10 Essential (primary) hypertension: Secondary | ICD-10-CM | POA: Diagnosis not present

## 2017-01-26 MED ORDER — LOSARTAN POTASSIUM 100 MG PO TABS: 100.0000 mg | ORAL_TABLET | Freq: Every day | ORAL | 3 refills | Status: DC

## 2017-01-26 NOTE — Progress Notes (Signed)
Patient ID: [name], female    DOB: [birth year]  Age: 83 y.o. MRN: [id number]    Subjective:  Subjective  HPI TAYLAN MAYHAN presents for recheck bp and labs.  No complaints.    Review of Systems  Constitutional: Negative for fever.  HENT: Negative for congestion.   Respiratory: Negative for shortness of breath.   Cardiovascular: Negative for chest pain, palpitations and leg swelling.  Gastrointestinal: Negative for abdominal pain, blood in stool and nausea.  Genitourinary: Negative for dysuria and frequency.  Skin: Negative for rash.  Allergic/Immunologic: Negative for environmental allergies.  Neurological: Negative for dizziness and headaches.  Psychiatric/Behavioral: The patient is not nervous/anxious.     History Past Medical History:  Diagnosis Date  . Allergy   . Cancer ([place])    breast,hx of  . Diverticulosis   . Hypertension   . Osteoporosis   . Poliomyelitis   . Vertigo    dizziness    She has a past surgical history that includes Breast surgery; Lumbar laminectomy; Dilation and curettage of uterus; ganglion cyst r wrist; and Spine surgery (11/2010).   Her family history includes Coronary artery disease in her other; Hypertension in her father and sister; Kidney disease in her father.She reports that she has never smoked. She has never used smokeless tobacco. She reports that she does not drink alcohol or use drugs.  Current Outpatient Prescriptions on File Prior to Visit  Medication Sig Dispense Refill  . atenolol (TENORMIN) 50 MG tablet Take 1.5 tablets (75 mg total) by mouth daily. 135 tablet 1  . b complex vitamins tablet Take 1 tablet by mouth daily.    . calcium citrate-vitamin D (CITRACAL+D) 315-200 MG-UNIT per tablet Take 1 tablet by mouth 3 (three) times daily.    . cholecalciferol (VITAMIN D) 1000 UNITS tablet Take 1,000 Units by mouth daily.      Marland Kitchen diptheria-tetanus toxoids (DECAVAC) 2-2 LF/0.5ML injection Inject 0.5 mLs into the muscle once. 0.5 mL  0  . Ginkgo Biloba (GINKGO PO) Take 1 tablet by mouth daily.    Marland Kitchen glucosamine-chondroitin 500-400 MG tablet Take 1 tablet by mouth 3 (three) times daily.    Marland Kitchen latanoprost (XALATAN) 0.005 % ophthalmic solution Place 1 drop into both eyes at bedtime.    Marland Kitchen loratadine (CLARITIN) 10 MG tablet Take 10 mg by mouth daily.      Marland Kitchen LORazepam (ATIVAN) 1 MG tablet TAKE 1 TABLET EVERY 8 HOURS 60 tablet 0  . MAGNESIUM PO Take 500 mg by mouth daily.     . meclizine (ANTIVERT) 25 MG tablet TAKE 1 TABLET BY MOUTH 3 TIMES A DAY AS NEEDED 60 tablet 0  . meloxicam (MOBIC) 15 MG tablet TAKE 1/2 TO 1 TABLET BY  MOUTH EVERY DAY AS NEEDED 90 tablet 2  . Misc Natural Products (LUTEIN 20 PO) Take 1 capsule by mouth daily.    . multivitamin-lutein (OCUVITE-LUTEIN) CAPS Take 1 capsule by mouth daily.    . pseudoephedrine (SUDAFED) 30 MG tablet Take 30 mg by mouth as needed.      . terazosin (HYTRIN) 10 MG capsule Take 1 capsule (10 mg total) by mouth at bedtime. 90 capsule 3   No current facility-administered medications on file prior to visit.      Objective:  Objective  Physical Exam  Constitutional: She is oriented to person, place, and time. She appears well-developed and well-nourished.  HENT:  Head: Normocephalic and atraumatic.  Eyes: Conjunctivae and EOM are normal.  Neck: Normal  range of motion. Neck supple. No JVD present. Carotid bruit is not present. No thyromegaly present.  Cardiovascular: Normal rate, regular rhythm and normal heart sounds.   No murmur heard. Pulmonary/Chest: Effort normal and breath sounds normal. No respiratory distress. She has no wheezes. She has no rales. She exhibits no tenderness.  Musculoskeletal: She exhibits no edema.  Neurological: She is alert and oriented to person, place, and time.  Psychiatric: She has a normal mood and affect. Her behavior is normal. Judgment and thought content normal.  Nursing note and vitals reviewed.  BP (!) 172/88 (BP Location: Right Arm,  Patient Position: Sitting, Cuff Size: Normal)   Pulse 62   Temp 98.3 F (36.8 C) (Oral)   Ht 5' (1.524 m)   Wt 117 lb 4 oz (53.2 kg)   SpO2 98%   BMI 22.90 kg/m  Wt Readings from Last 3 Encounters:  01/26/17 117 lb 4 oz (53.2 kg)  12/29/16 117 lb 2 oz (53.1 kg)  12/22/15 123 lb 3.2 oz (55.9 kg)     Lab Results  Component Value Date   WBC 4.2 12/29/2016   HGB 12.8 12/29/2016   HCT 38.7 12/29/2016   PLT 203.0 12/29/2016   GLUCOSE 87 01/26/2017   CHOL 168 12/29/2016   TRIG 72.0 12/29/2016   HDL 76.90 12/29/2016   LDLCALC 77 12/29/2016   ALT 16 12/29/2016   AST 23 12/29/2016   NA 136 01/26/2017   K 3.7 01/26/2017   CL 100 01/26/2017   CREATININE 0.62 01/26/2017   BUN 11 01/26/2017   CO2 30 01/26/2017   TSH 1.05 03/11/2009   HGBA1C 5.5 08/20/2013   MICROALBUR 0.9 09/02/2014    Ct Lumbar Spine W Contrast  Result Date: 03/09/2012 *RADIOLOGY REPORT* Clinical Data:  Pain right-side of back into right hip.  Does not extend into the right leg. LUMBAR MYELOGRAM AND POST MYELOGRAM CT MYELOGRAM LUMBAR Technique: Contrast was injected by Dr.Jenkins at the L5-S1 level. Fluoroscopy time:  0.57 minutes Comparison: 03/20 to 01/2012 plain film examination.  Preoperative MR 09/04/2010. Findings: Dextroscoliosis lumbar spine. Prior surgery with fusion L4 through S1 with bilateral pedicle screws, posterior connecting bar and interbody spacer at the L4-5 level. Minimal impression upon the ventral aspect of the thecal sac T12-L1 through L3-4. No abnormal motion between flexion and extension. IMPRESSION: Dextroscoliosis lumbar spine. Prior surgery with fusion L4 through S1 with bilateral pedicle screws, posterior connecting bar and interbody spacer at the L4-5 level. Minimal impression upon the ventral aspect of the thecal sac T12-L1 through L3-4. No abnormal motion between flexion and extension. CT MYELOGRAPHY LUMBAR SPINE Technique: Multidetector CT imaging of the lumbar spine was performed  following myelography.  Multiplanar CT image reconstructions were also generated. Findings: Last fully open disc space is labeled L5-S1.  Present examination incorporates from T9-10 through the past to the level. Dextroscoliosis. Conus T12-L1 level. 1 cm gallstone. Calcification in the pancreatic head.  Cannot exclude a tiny distal common bile duct stone.  Evaluation limited. Atherosclerotic type changes of the aorta with ectasia.  Sigmoid diverticulosis. T9-10:  Negative. T10-11:  Minimal right posterior element hypertrophy. T11-12:  Minimal right-sided facet bony overgrowth. T12-L1:  Minimal bulge greater to the right. L1-2:  Mild facet joint degenerative changes greater on the left. L2-3:  Mild bulge.  Mild facet joint degenerative changes.  Mild ligamentum flavum hypertrophy. L3-4:  Rotation.  Bulge with lateral extension greater to the left. Central extension slightly greater to the right.  Facet joint degenerative changes  and ligamentum flavum hypertrophy.  Mild right lateral recess stenosis. L4-5:  Prior fusion.  Right pedicle screw traverses through the superior plate and superior margin of the pedicle.  Only small areas of bony fusion across the disc space.  Minimal anterior slip of L4.  No significant spinal stenosis or foraminal narrowing. L5-S1:  Prior fusion.  Pedicle screws and posterior connecting bar in place.  Fusion of the right aspect of the disc space.  L5 vertebra with 1 cm of anterior slip.  Broad-based spur extends into the inferior aspect of the neural foramen.  Right lateral osteophyte with mass effect upon the exiting right L5 nerve root. IMPRESSION: Dextroscoliosis. L3-4 mild right lateral recess stenosis. L4-5 prior fusion.  Right pedicle screw traverses through the superior plate and superior margin of the pedicle.  Only small areas of bony fusion across the disc space.  Minimal anterior slip of L4.  No significant spinal stenosis or foraminal narrowing. L5-S1 prior fusion.  Pedicle  screws and posterior connecting bar in place.  Fusion of the right aspect of the disc space.  L5 vertebra with 1 cm of anterior slip.  Broad-based spur extends into the inferior aspect of the neural foramen.  Right lateral osteophyte with mass effect upon the exiting right L5 nerve root. 1 cm gallstone. Calcification in the pancreatic head.  Cannot exclude a tiny distal common bile duct stone.  Evaluation limited. Atherosclerotic type changes of the aorta with ectasia. Original Report Authenticated By: Doug Sou, M.D.   Dg Myelogram Lumbar  Result Date: 03/09/2012 *RADIOLOGY REPORT* Clinical Data:  Pain right-side of back into right hip.  Does not extend into the right leg. LUMBAR MYELOGRAM AND POST MYELOGRAM CT MYELOGRAM LUMBAR Technique: Contrast was injected by Dr.Jenkins at the L5-S1 level. Fluoroscopy time:  0.57 minutes Comparison: 03/20 to 01/2012 plain film examination.  Preoperative MR 09/04/2010. Findings: Dextroscoliosis lumbar spine. Prior surgery with fusion L4 through S1 with bilateral pedicle screws, posterior connecting bar and interbody spacer at the L4-5 level. Minimal impression upon the ventral aspect of the thecal sac T12-L1 through L3-4. No abnormal motion between flexion and extension. IMPRESSION: Dextroscoliosis lumbar spine. Prior surgery with fusion L4 through S1 with bilateral pedicle screws, posterior connecting bar and interbody spacer at the L4-5 level. Minimal impression upon the ventral aspect of the thecal sac T12-L1 through L3-4. No abnormal motion between flexion and extension. CT MYELOGRAPHY LUMBAR SPINE Technique: Multidetector CT imaging of the lumbar spine was performed following myelography.  Multiplanar CT image reconstructions were also generated. Findings: Last fully open disc space is labeled L5-S1.  Present examination incorporates from T9-10 through the past to the level. Dextroscoliosis. Conus T12-L1 level. 1 cm gallstone. Calcification in the pancreatic head.   Cannot exclude a tiny distal common bile duct stone.  Evaluation limited. Atherosclerotic type changes of the aorta with ectasia.  Sigmoid diverticulosis. T9-10:  Negative. T10-11:  Minimal right posterior element hypertrophy. T11-12:  Minimal right-sided facet bony overgrowth. T12-L1:  Minimal bulge greater to the right. L1-2:  Mild facet joint degenerative changes greater on the left. L2-3:  Mild bulge.  Mild facet joint degenerative changes.  Mild ligamentum flavum hypertrophy. L3-4:  Rotation.  Bulge with lateral extension greater to the left. Central extension slightly greater to the right.  Facet joint degenerative changes and ligamentum flavum hypertrophy.  Mild right lateral recess stenosis. L4-5:  Prior fusion.  Right pedicle screw traverses through the superior plate and superior margin of the pedicle.  Only small  areas of bony fusion across the disc space.  Minimal anterior slip of L4.  No significant spinal stenosis or foraminal narrowing. L5-S1:  Prior fusion.  Pedicle screws and posterior connecting bar in place.  Fusion of the right aspect of the disc space.  L5 vertebra with 1 cm of anterior slip.  Broad-based spur extends into the inferior aspect of the neural foramen.  Right lateral osteophyte with mass effect upon the exiting right L5 nerve root. IMPRESSION: Dextroscoliosis. L3-4 mild right lateral recess stenosis. L4-5 prior fusion.  Right pedicle screw traverses through the superior plate and superior margin of the pedicle.  Only small areas of bony fusion across the disc space.  Minimal anterior slip of L4.  No significant spinal stenosis or foraminal narrowing. L5-S1 prior fusion.  Pedicle screws and posterior connecting bar in place.  Fusion of the right aspect of the disc space.  L5 vertebra with 1 cm of anterior slip.  Broad-based spur extends into the inferior aspect of the neural foramen.  Right lateral osteophyte with mass effect upon the exiting right L5 nerve root. 1 cm gallstone.  Calcification in the pancreatic head.  Cannot exclude a tiny distal common bile duct stone.  Evaluation limited. Atherosclerotic type changes of the aorta with ectasia. Original Report Authenticated By: Doug Sou, M.D.     Assessment & Plan:  Plan  I have discontinued Ms. Helms's lisinopril. I am also having her start on losartan. Additionally, I am having her maintain her loratadine, pseudoephedrine, cholecalciferol, MAGNESIUM PO, multivitamin-lutein, calcium citrate-vitamin D, Ginkgo Biloba (GINKGO PO), glucosamine-chondroitin, Misc Natural Products (LUTEIN 20 PO), b complex vitamins, LORazepam, diptheria-tetanus toxoids, meloxicam, meclizine, latanoprost, atenolol, and terazosin.  Meds ordered this encounter  Medications  . losartan (COZAAR) 100 MG tablet    Sig: Take 1 tablet (100 mg total) by mouth daily.    Dispense:  90 tablet    Refill:  3    Problem List Items Addressed This Visit    None    Visit Diagnoses    Essential hypertension    -  Primary   Relevant Medications   losartan (COZAAR) 100 MG tablet   Other Relevant Orders   Basic metabolic panel (Completed)    d/c lisinopril and start losartan  F/u 2-3 weeks   Follow-up: Return in about 3 weeks (around 02/16/2017), or bp check.  [name], DO

## 2017-01-26 NOTE — Patient Instructions (Signed)

## 2017-01-26 NOTE — Progress Notes (Signed)
Pre visit review using our clinic review tool, if applicable. No additional management support is needed unless otherwise documented below in the visit note. 

## 2017-01-27 LAB — BASIC METABOLIC PANEL
BUN: 11 mg/dL (ref 6–23)
CALCIUM: 9.3 mg/dL (ref 8.4–10.5)
CO2: 30 mEq/L (ref 19–32)
Chloride: 100 mEq/L (ref 96–112)
Creatinine, Ser: 0.62 mg/dL (ref 0.40–1.20)
GFR: 97.24 mL/min (ref >60.00)
GLUCOSE: 87 mg/dL (ref 70–99)
POTASSIUM: 3.7 meq/L (ref 3.5–5.1)
SODIUM: 136 meq/L (ref 135–145)

## 2017-02-15 DIAGNOSIS — H31012 Macula scars of posterior pole (postinflammatory) (post-traumatic), left eye: Secondary | ICD-10-CM | POA: Diagnosis not present

## 2017-02-17 ENCOUNTER — Ambulatory Visit (INDEPENDENT_AMBULATORY_CARE_PROVIDER_SITE_OTHER): Payer: Medicare Other | Admitting: Family Medicine

## 2017-02-17 DIAGNOSIS — I10 Essential (primary) hypertension: Secondary | ICD-10-CM

## 2017-02-17 MED ORDER — TERAZOSIN HCL 5 MG PO CAPS: 5.0000 mg | ORAL_CAPSULE | Freq: Every morning | ORAL | 0 refills | Status: DC

## 2017-02-17 NOTE — Patient Instructions (Signed)
Per Dr. Carollee Herter patient to take Terazosin 5 mg 1 every morning and 2 at bedtime. And return for BP check in 1 month

## 2017-02-17 NOTE — Progress Notes (Addendum)
Pre visit review using our clinic tool,if applicable. No additional management support is needed unless otherwise documented below in the visit note.   [name] per orders from Dr. Carollee Herter dated 01/27/23.  Patient sates she has had some headaches and neck pain. Denies SOB and chest pain.  Taking all medications as ordered.  BP last visit was 172/88 P = 62  BP today = 179/83 P=56  Per Dr. Carollee Herter patient to take Terazosin 5 mg 1 every morning and 2 at bedtime. And return for BP check in 1 month. Appointment scheduled and given to patient. Patient states she would like only #30 of the 5 mg tablets ordered for now because she has many of the 10 mg tablets.  Letter with instructions mailed out to patient as well.  Reviewed and agree  [name], DO

## 2017-02-20 ENCOUNTER — Telehealth: Payer: Self-pay | Admitting: Family Medicine

## 2017-02-20 MED ORDER — TERAZOSIN HCL 5 MG PO CAPS: 5.0000 mg | ORAL_CAPSULE | Freq: Every morning | ORAL | 0 refills | Status: DC

## 2017-02-20 NOTE — Telephone Encounter (Signed)
Left message on machine to call back. Called and canceled rx that was sent to mail order and re-sent rx to [facility] per pt's request

## 2017-02-20 NOTE — Telephone Encounter (Signed)
Pt states we were to call in to [facility] on [address] NOT send it to the RX mail order. Terazosin. We sent it to RX mail pharm. Please call it in to [facility]on [address]. Pt ph [phone] home until 11:00.

## 2017-02-24 NOTE — Telephone Encounter (Signed)
Spoke with pt and she got her medication from her local pharmacy and she states she does not need anything additional

## 2017-03-17 ENCOUNTER — Ambulatory Visit (INDEPENDENT_AMBULATORY_CARE_PROVIDER_SITE_OTHER): Payer: Medicare Other | Admitting: Family Medicine

## 2017-03-17 VITALS — BP 198/80 | HR 67

## 2017-03-17 DIAGNOSIS — Z23 Encounter for immunization: Secondary | ICD-10-CM

## 2017-03-17 DIAGNOSIS — I10 Essential (primary) hypertension: Secondary | ICD-10-CM | POA: Diagnosis not present

## 2017-03-17 NOTE — Progress Notes (Signed)
Noted   R Lowne Chase, DO  

## 2017-03-17 NOTE — Progress Notes (Signed)
Pre visit review using our clinic tool,if applicable. No additional management support is needed unless otherwise documented below in the visit note.   [name] and High dose flu shot per order from Dr. Etter Sjogren -Chase dated 02/17/2017.  Patient states Terazosin  Causes her to have sinus congestion and she would like to "back off" of it. Stattes a friend of her suggested she tru Amlodipine -Benazopril 5-20 mg which works for him.   BP today = 198/80 P = 67  Per Dr. Carollee Herter patient to increase Terazosin to 10 mg bid and return for OV with Dr. Carollee Herter in 2 weeks. Appointment scheduled for patient.

## 2017-03-31 ENCOUNTER — Ambulatory Visit (INDEPENDENT_AMBULATORY_CARE_PROVIDER_SITE_OTHER): Payer: Medicare Other | Admitting: Family Medicine

## 2017-03-31 ENCOUNTER — Encounter: Payer: Self-pay | Admitting: Family Medicine

## 2017-03-31 VITALS — BP 170/70 | HR 59 | Temp 98.2°F | Resp 18 | Wt 121.0 lb

## 2017-03-31 DIAGNOSIS — I10 Essential (primary) hypertension: Secondary | ICD-10-CM | POA: Diagnosis not present

## 2017-03-31 DIAGNOSIS — J324 Chronic pansinusitis: Secondary | ICD-10-CM

## 2017-03-31 MED ORDER — FLUTICASONE PROPIONATE 50 MCG/ACT NA SUSP: 2.0000 | Freq: Every day | NASAL | 6 refills | Status: DC

## 2017-03-31 MED ORDER — IRBESARTAN 150 MG PO TABS: 150.0000 mg | ORAL_TABLET | Freq: Every day | ORAL | 2 refills | Status: DC

## 2017-03-31 NOTE — Patient Instructions (Signed)

## 2017-03-31 NOTE — Progress Notes (Signed)
Patient ID: Cassidy Brown, female    DOB: 07/17/1929  Age: 83 y.o. MRN: [id number]    Subjective:  Subjective  HPI [name] presents for bp check and sinus pressure.   She has been taking claritin daily.    Review of Systems  Constitutional: Negative for chills and fever.  HENT: Positive for congestion, postnasal drip, rhinorrhea and sinus pressure.   Respiratory: Negative for cough, chest tightness, shortness of breath and wheezing.   Cardiovascular: Negative for chest pain, palpitations and leg swelling.  Allergic/Immunologic: Negative for environmental allergies.    History Past Medical History:  Diagnosis Date  . Allergy   . Cancer ([place])    breast,hx of  . Diverticulosis   . Hypertension   . Osteoporosis   . Poliomyelitis   . Vertigo    dizziness    She has a past surgical history that includes Breast surgery; Lumbar laminectomy; Dilation and curettage of uterus; ganglion cyst r wrist; and Spine surgery (11/2010).   Her family history includes Coronary artery disease in her other; Hypertension in her father and sister; Kidney disease in her father.She reports that she has never smoked. She has never used smokeless tobacco. She reports that she does not drink alcohol or use drugs.  Current Outpatient Prescriptions on File Prior to Visit  Medication Sig Dispense Refill  . atenolol (TENORMIN) 50 MG tablet Take 1.5 tablets (75 mg total) by mouth daily. 135 tablet 1  . b complex vitamins tablet Take 1 tablet by mouth daily.    . calcium citrate-vitamin D (CITRACAL+D) 315-200 MG-UNIT per tablet Take 1 tablet by mouth 3 (three) times daily.    . cholecalciferol (VITAMIN D) 1000 UNITS tablet Take 1,000 Units by mouth daily.      . Ginkgo Biloba (GINKGO PO) Take 1 tablet by mouth daily.    Marland Kitchen glucosamine-chondroitin 500-400 MG tablet Take 1 tablet by mouth 3 (three) times daily.    Marland Kitchen latanoprost (XALATAN) 0.005 % ophthalmic solution Place 1 drop into both eyes at bedtime.    Marland Kitchen  loratadine (CLARITIN) 10 MG tablet Take 10 mg by mouth daily.      Marland Kitchen LORazepam (ATIVAN) 1 MG tablet TAKE 1 TABLET EVERY 8 HOURS 60 tablet 0  . MAGNESIUM PO Take 500 mg by mouth daily.     . meclizine (ANTIVERT) 25 MG tablet TAKE 1 TABLET BY MOUTH 3 TIMES A DAY AS NEEDED 60 tablet 0  . meloxicam (MOBIC) 15 MG tablet TAKE 1/2 TO 1 TABLET BY  MOUTH EVERY DAY AS NEEDED 90 tablet 2  . Misc Natural Products (LUTEIN 20 PO) Take 1 capsule by mouth daily.    . multivitamin-lutein (OCUVITE-LUTEIN) CAPS Take 1 capsule by mouth daily.    . pseudoephedrine (SUDAFED) 30 MG tablet Take 30 mg by mouth as needed.       No current facility-administered medications on file prior to visit.      Objective:  Objective  Physical Exam  Constitutional: She is oriented to person, place, and time. She appears well-developed and well-nourished.  HENT:  Head: Normocephalic and atraumatic.  Right Ear: External ear normal.  Left Ear: External ear normal.  Nose: Right sinus exhibits no maxillary sinus tenderness and no frontal sinus tenderness. Left sinus exhibits no maxillary sinus tenderness and no frontal sinus tenderness.  + PND + errythema  Eyes: Conjunctivae and EOM are normal. Right eye exhibits no discharge. Left eye exhibits no discharge.  Neck: Normal range of motion. Neck  supple. No JVD present. Carotid bruit is not present. No thyromegaly present.  Cardiovascular: Normal rate, regular rhythm and normal heart sounds.   No murmur heard. Pulmonary/Chest: Effort normal and breath sounds normal. No respiratory distress. She has no wheezes. She has no rales. She exhibits no tenderness.  Musculoskeletal: She exhibits no edema.  Lymphadenopathy:    She has cervical adenopathy.  Neurological: She is alert and oriented to person, place, and time.  Psychiatric: She has a normal mood and affect.  Nursing note and vitals reviewed.  BP (!) 170/70 (BP Location: Left Arm, Patient Position: Sitting, Cuff Size: Normal)    Pulse (!) 59   Temp 98.2 F (36.8 C) (Oral)   Resp 18   Wt 121 lb (54.9 kg)   SpO2 98%   BMI 23.63 kg/m  Wt Readings from Last 3 Encounters:  03/31/17 121 lb (54.9 kg)  01/26/17 117 lb 4 oz (53.2 kg)  12/29/16 117 lb 2 oz (53.1 kg)     Lab Results  Component Value Date   WBC 4.2 12/29/2016   HGB 12.8 12/29/2016   HCT 38.7 12/29/2016   PLT 203.0 12/29/2016   GLUCOSE 87 01/26/2017   CHOL 168 12/29/2016   TRIG 72.0 12/29/2016   HDL 76.90 12/29/2016   LDLCALC 77 12/29/2016   ALT 16 12/29/2016   AST 23 12/29/2016   NA 136 01/26/2017   K 3.7 01/26/2017   CL 100 01/26/2017   CREATININE 0.62 01/26/2017   BUN 11 01/26/2017   CO2 30 01/26/2017   TSH 1.05 03/11/2009   HGBA1C 5.5 08/20/2013   MICROALBUR 0.9 09/02/2014    Ct Lumbar Spine W Contrast  Result Date: 03/09/2012 *RADIOLOGY REPORT* Clinical Data:  Pain right-side of back into right hip.  Does not extend into the right leg. LUMBAR MYELOGRAM AND POST MYELOGRAM CT MYELOGRAM LUMBAR Technique: Contrast was injected by Dr.Jenkins at the L5-S1 level. Fluoroscopy time:  0.57 minutes Comparison: 03/20 to 01/2012 plain film examination.  Preoperative MR 09/04/2010. Findings: Dextroscoliosis lumbar spine. Prior surgery with fusion L4 through S1 with bilateral pedicle screws, posterior connecting bar and interbody spacer at the L4-5 level. Minimal impression upon the ventral aspect of the thecal sac T12-L1 through L3-4. No abnormal motion between flexion and extension. IMPRESSION: Dextroscoliosis lumbar spine. Prior surgery with fusion L4 through S1 with bilateral pedicle screws, posterior connecting bar and interbody spacer at the L4-5 level. Minimal impression upon the ventral aspect of the thecal sac T12-L1 through L3-4. No abnormal motion between flexion and extension. CT MYELOGRAPHY LUMBAR SPINE Technique: Multidetector CT imaging of the lumbar spine was performed following myelography.  Multiplanar CT image reconstructions were  also generated. Findings: Last fully open disc space is labeled L5-S1.  Present examination incorporates from T9-10 through the past to the level. Dextroscoliosis. Conus T12-L1 level. 1 cm gallstone. Calcification in the pancreatic head.  Cannot exclude a tiny distal common bile duct stone.  Evaluation limited. Atherosclerotic type changes of the aorta with ectasia.  Sigmoid diverticulosis. T9-10:  Negative. T10-11:  Minimal right posterior element hypertrophy. T11-12:  Minimal right-sided facet bony overgrowth. T12-L1:  Minimal bulge greater to the right. L1-2:  Mild facet joint degenerative changes greater on the left. L2-3:  Mild bulge.  Mild facet joint degenerative changes.  Mild ligamentum flavum hypertrophy. L3-4:  Rotation.  Bulge with lateral extension greater to the left. Central extension slightly greater to the right.  Facet joint degenerative changes and ligamentum flavum hypertrophy.  Mild right lateral recess  stenosis. L4-5:  Prior fusion.  Right pedicle screw traverses through the superior plate and superior margin of the pedicle.  Only small areas of bony fusion across the disc space.  Minimal anterior slip of L4.  No significant spinal stenosis or foraminal narrowing. L5-S1:  Prior fusion.  Pedicle screws and posterior connecting bar in place.  Fusion of the right aspect of the disc space.  L5 vertebra with 1 cm of anterior slip.  Broad-based spur extends into the inferior aspect of the neural foramen.  Right lateral osteophyte with mass effect upon the exiting right L5 nerve root. IMPRESSION: Dextroscoliosis. L3-4 mild right lateral recess stenosis. L4-5 prior fusion.  Right pedicle screw traverses through the superior plate and superior margin of the pedicle.  Only small areas of bony fusion across the disc space.  Minimal anterior slip of L4.  No significant spinal stenosis or foraminal narrowing. L5-S1 prior fusion.  Pedicle screws and posterior connecting bar in place.  Fusion of the right  aspect of the disc space.  L5 vertebra with 1 cm of anterior slip.  Broad-based spur extends into the inferior aspect of the neural foramen.  Right lateral osteophyte with mass effect upon the exiting right L5 nerve root. 1 cm gallstone. Calcification in the pancreatic head.  Cannot exclude a tiny distal common bile duct stone.  Evaluation limited. Atherosclerotic type changes of the aorta with ectasia. Original Report Authenticated By: Doug Sou, M.D.   Dg Myelogram Lumbar  Result Date: 03/09/2012 *RADIOLOGY REPORT* Clinical Data:  Pain right-side of back into right hip.  Does not extend into the right leg. LUMBAR MYELOGRAM AND POST MYELOGRAM CT MYELOGRAM LUMBAR Technique: Contrast was injected by Dr.Jenkins at the L5-S1 level. Fluoroscopy time:  0.57 minutes Comparison: 03/20 to 01/2012 plain film examination.  Preoperative MR 09/04/2010. Findings: Dextroscoliosis lumbar spine. Prior surgery with fusion L4 through S1 with bilateral pedicle screws, posterior connecting bar and interbody spacer at the L4-5 level. Minimal impression upon the ventral aspect of the thecal sac T12-L1 through L3-4. No abnormal motion between flexion and extension. IMPRESSION: Dextroscoliosis lumbar spine. Prior surgery with fusion L4 through S1 with bilateral pedicle screws, posterior connecting bar and interbody spacer at the L4-5 level. Minimal impression upon the ventral aspect of the thecal sac T12-L1 through L3-4. No abnormal motion between flexion and extension. CT MYELOGRAPHY LUMBAR SPINE Technique: Multidetector CT imaging of the lumbar spine was performed following myelography.  Multiplanar CT image reconstructions were also generated. Findings: Last fully open disc space is labeled L5-S1.  Present examination incorporates from T9-10 through the past to the level. Dextroscoliosis. Conus T12-L1 level. 1 cm gallstone. Calcification in the pancreatic head.  Cannot exclude a tiny distal common bile duct stone.  Evaluation  limited. Atherosclerotic type changes of the aorta with ectasia.  Sigmoid diverticulosis. T9-10:  Negative. T10-11:  Minimal right posterior element hypertrophy. T11-12:  Minimal right-sided facet bony overgrowth. T12-L1:  Minimal bulge greater to the right. L1-2:  Mild facet joint degenerative changes greater on the left. L2-3:  Mild bulge.  Mild facet joint degenerative changes.  Mild ligamentum flavum hypertrophy. L3-4:  Rotation.  Bulge with lateral extension greater to the left. Central extension slightly greater to the right.  Facet joint degenerative changes and ligamentum flavum hypertrophy.  Mild right lateral recess stenosis. L4-5:  Prior fusion.  Right pedicle screw traverses through the superior plate and superior margin of the pedicle.  Only small areas of bony fusion across the disc space.  Minimal anterior slip of L4.  No significant spinal stenosis or foraminal narrowing. L5-S1:  Prior fusion.  Pedicle screws and posterior connecting bar in place.  Fusion of the right aspect of the disc space.  L5 vertebra with 1 cm of anterior slip.  Broad-based spur extends into the inferior aspect of the neural foramen.  Right lateral osteophyte with mass effect upon the exiting right L5 nerve root. IMPRESSION: Dextroscoliosis. L3-4 mild right lateral recess stenosis. L4-5 prior fusion.  Right pedicle screw traverses through the superior plate and superior margin of the pedicle.  Only small areas of bony fusion across the disc space.  Minimal anterior slip of L4.  No significant spinal stenosis or foraminal narrowing. L5-S1 prior fusion.  Pedicle screws and posterior connecting bar in place.  Fusion of the right aspect of the disc space.  L5 vertebra with 1 cm of anterior slip.  Broad-based spur extends into the inferior aspect of the neural foramen.  Right lateral osteophyte with mass effect upon the exiting right L5 nerve root. 1 cm gallstone. Calcification in the pancreatic head.  Cannot exclude a tiny distal  common bile duct stone.  Evaluation limited. Atherosclerotic type changes of the aorta with ectasia. Original Report Authenticated By: Doug Sou, M.D.     Assessment & Plan:  Plan  I have discontinued Ms. Markgraf's diptheria-tetanus toxoids, terazosin, losartan, and terazosin. I am also having her start on irbesartan and fluticasone. Additionally, I am having her maintain her loratadine, pseudoephedrine, cholecalciferol, MAGNESIUM PO, multivitamin-lutein, calcium citrate-vitamin D, Ginkgo Biloba (GINKGO PO), glucosamine-chondroitin, Misc Natural Products (LUTEIN 20 PO), b complex vitamins, LORazepam, meloxicam, meclizine, latanoprost, and atenolol.  Meds ordered this encounter  Medications  . irbesartan (AVAPRO) 150 MG tablet    Sig: Take 1 tablet (150 mg total) by mouth daily.    Dispense:  30 tablet    Refill:  2  . fluticasone (FLONASE) 50 MCG/ACT nasal spray    Sig: Place 2 sprays into both nostrils daily.    Dispense:  16 g    Refill:  6    Problem List Items Addressed This Visit      Unprioritized   Essential hypertension    Start avapro  rto 2-3 weeks       Relevant Medications   irbesartan (AVAPRO) 150 MG tablet   Pansinusitis - Primary    claritin helps Also use flonase rto prn      Relevant Medications   fluticasone (FLONASE) 50 MCG/ACT nasal spray      Follow-up: Return in about 2 weeks (around 04/14/2017) for hypertension.  [name], DO

## 2017-04-02 DIAGNOSIS — J324 Chronic pansinusitis: Secondary | ICD-10-CM | POA: Insufficient documentation

## 2017-04-02 DIAGNOSIS — I10 Essential (primary) hypertension: Secondary | ICD-10-CM

## 2017-04-02 HISTORY — DX: Chronic pansinusitis: J32.4

## 2017-04-02 HISTORY — DX: Essential (primary) hypertension: I10

## 2017-04-02 NOTE — Assessment & Plan Note (Signed)
claritin helps Also use flonase rto prn

## 2017-04-02 NOTE — Assessment & Plan Note (Signed)
Start avapro  rto 2-3 weeks

## 2017-04-10 ENCOUNTER — Ambulatory Visit (INDEPENDENT_AMBULATORY_CARE_PROVIDER_SITE_OTHER): Payer: Medicare Other | Admitting: Family Medicine

## 2017-04-10 ENCOUNTER — Telehealth: Payer: Self-pay | Admitting: Family Medicine

## 2017-04-10 ENCOUNTER — Encounter: Payer: Self-pay | Admitting: Family Medicine

## 2017-04-10 VITALS — BP 171/71 | HR 47 | Temp 98.0°F | Resp 16 | Ht 60.0 in | Wt 117.8 lb

## 2017-04-10 DIAGNOSIS — I10 Essential (primary) hypertension: Secondary | ICD-10-CM

## 2017-04-10 MED ORDER — IRBESARTAN 300 MG PO TABS: 300.0000 mg | ORAL_TABLET | Freq: Every day | ORAL | 2 refills | Status: DC

## 2017-04-10 MED ORDER — AMLODIPINE BESYLATE 5 MG PO TABS: 5.0000 mg | ORAL_TABLET | Freq: Every day | ORAL | 3 refills | Status: DC

## 2017-04-10 NOTE — Patient Instructions (Signed)

## 2017-04-10 NOTE — Telephone Encounter (Signed)
Called Pt back and she states that she's not having any symptoms this morning from her elevated BP outside of "not feeling well, head hurts". Carla added Pt to schedule to be seen at 1100 today.

## 2017-04-10 NOTE — Progress Notes (Signed)
Patient ID: Cassidy Brown, female   DOB: 05/16/1930, 83 y.o.   MRN: [id number]     Subjective:  I acted as a Education administrator for Dr. Carollee Herter.  Guerry Bruin, [place]   Patient ID: Cassidy Brown, female    DOB: 1930-05-18, 83 y.o.   MRN: [id number]  Chief Complaint  Patient presents with  . Hypertension    only took atenolol this am    HPI  Patient is in today for hypertension.  Her blood pressure has been running high for the past 2 days.  She has been taking 2 tablets of the Avapro and it has still been running high.  No chest pains.  She has been having headache and a little nausea.  Patient Care Team: Carollee Herter, Alferd Apa, DO as PCP - General Marygrace Drought, MD as Consulting Physician (Ophthalmology)   Past Medical History:  Diagnosis Date  . Allergy   . Cancer ([place])    breast,hx of  . Diverticulosis   . Glaucoma   . Hypertension   . Osteoporosis   . Poliomyelitis   . Vertigo    dizziness    Past Surgical History:  Procedure Laterality Date  . BREAST SURGERY    . DILATION AND CURETTAGE OF UTERUS     x3  . ganglion cyst r wrist    . LUMBAR LAMINECTOMY    . SPINE SURGERY  11/2010   fusion--jenkins    Family History  Problem Relation Age of Onset  . Hypertension Father   . Kidney disease Father   . Hypertension Sister   . Coronary artery disease Other        P uncles    Social History   Social History  . Marital status: Married    Spouse name: N/A  . Number of children: N/A  . Years of education: N/A   Occupational History  . [employer] --retired Retired   Social History Main Topics  . Smoking status: Never Smoker  . Smokeless tobacco: Never Used  . Alcohol use No  . Drug use: No  . Sexual activity: Yes    Partners: Male   Other Topics Concern  . Not on file   Social History Narrative   Exercise-- walking 1 x a week    Outpatient Medications Prior to Visit  Medication Sig Dispense Refill  . atenolol (TENORMIN) 50 MG tablet Take 1.5 tablets (75  mg total) by mouth daily. 135 tablet 1  . b complex vitamins tablet Take 1 tablet by mouth daily.    . calcium citrate-vitamin D (CITRACAL+D) 315-200 MG-UNIT per tablet Take 1 tablet by mouth 3 (three) times daily.    . cholecalciferol (VITAMIN D) 1000 UNITS tablet Take 1,000 Units by mouth daily.      . fluticasone (FLONASE) 50 MCG/ACT nasal spray Place 2 sprays into both nostrils daily. 16 g 6  . Ginkgo Biloba (GINKGO PO) Take 1 tablet by mouth daily.    Marland Kitchen glucosamine-chondroitin 500-400 MG tablet Take 1 tablet by mouth 3 (three) times daily.    Marland Kitchen latanoprost (XALATAN) 0.005 % ophthalmic solution Place 1 drop into both eyes at bedtime.    Marland Kitchen loratadine (CLARITIN) 10 MG tablet Take 10 mg by mouth daily.      Marland Kitchen LORazepam (ATIVAN) 1 MG tablet TAKE 1 TABLET EVERY 8 HOURS 60 tablet 0  . MAGNESIUM PO Take 500 mg by mouth daily.     . meclizine (ANTIVERT) 25 MG tablet TAKE 1 TABLET  BY MOUTH 3 TIMES A DAY AS NEEDED 60 tablet 0  . meloxicam (MOBIC) 15 MG tablet TAKE 1/2 TO 1 TABLET BY  MOUTH EVERY DAY AS NEEDED 90 tablet 2  . Misc Natural Products (LUTEIN 20 PO) Take 1 capsule by mouth daily.    . multivitamin-lutein (OCUVITE-LUTEIN) CAPS Take 1 capsule by mouth daily.    . pseudoephedrine (SUDAFED) 30 MG tablet Take 30 mg by mouth as needed.      . irbesartan (AVAPRO) 150 MG tablet Take 1 tablet (150 mg total) by mouth daily. 30 tablet 2   No facility-administered medications prior to visit.     Allergies  Allergen Reactions  . Amoxicillin Diarrhea    "I had diarrhea really bad."  . Hydrocodone   . Plendil [Felodipine]     Caused pains in back and stomach and caused constipation  . Propoxyphene N-Acetaminophen   . Tenex [Guanfacine Hcl]     Made patient feel really bad and was ineffective in decreasing BP    Review of Systems  Constitutional: Negative for fever and malaise/fatigue.  HENT: Negative for congestion.   Eyes: Negative for blurred vision.  Respiratory: Negative for shortness  of breath.   Cardiovascular: Negative for chest pain, palpitations and leg swelling.  Gastrointestinal: Positive for nausea. Negative for abdominal pain and blood in stool.  Genitourinary: Negative for dysuria and frequency.  Musculoskeletal: Negative for falls.  Skin: Negative for rash.  Neurological: Positive for headaches. Negative for dizziness and loss of consciousness.  Endo/Heme/Allergies: Negative for environmental allergies.  Psychiatric/Behavioral: Negative for depression. The patient is not nervous/anxious.        Objective:    Physical Exam  Constitutional: She is oriented to person, place, and time. She appears well-developed and well-nourished.  HENT:  Head: Normocephalic and atraumatic.  Eyes: Conjunctivae and EOM are normal.  Neck: Normal range of motion. Neck supple. No JVD present. Carotid bruit is not present. No thyromegaly present.  Cardiovascular: Normal rate, regular rhythm and normal heart sounds.   No murmur heard. Pulmonary/Chest: Effort normal and breath sounds normal. No respiratory distress. She has no wheezes. She has no rales. She exhibits no tenderness.  Musculoskeletal: She exhibits no edema.  Neurological: She is alert and oriented to person, place, and time.  Psychiatric: She has a normal mood and affect.  Nursing note and vitals reviewed.   BP (!) 171/71   Pulse (!) 47   Temp 98 F (36.7 C) (Oral)   Resp 16   Ht 5' (1.524 m)   Wt 117 lb 12.8 oz (53.4 kg)   SpO2 98%   BMI 23.01 kg/m  Wt Readings from Last 3 Encounters:  04/10/17 117 lb 12.8 oz (53.4 kg)  03/31/17 121 lb (54.9 kg)  01/26/17 117 lb 4 oz (53.2 kg)   BP Readings from Last 3 Encounters:  04/10/17 (!) 171/71  03/31/17 (!) 170/70  03/17/17 (!) 198/80     Immunization History  Administered Date(s) Administered  . Influenza Split 04/27/2011, 03/27/2012  . Influenza Whole 05/03/2007, 03/07/2008, 03/11/2009, 03/16/2010, 04/27/2010  . Influenza, High Dose Seasonal PF  04/09/2013, 03/28/2016, 03/17/2017  . Influenza,inj,Quad PF,6+ Mos 03/20/2014, 03/20/2015  . Pneumococcal Conjugate-13 03/20/2015  . Pneumococcal Polysaccharide-23 03/11/2009    Health Maintenance  Topic Date Due  . TETANUS/TDAP  02/04/1951  . MAMMOGRAM  10/25/2017  . DEXA SCAN  10/26/2018  . INFLUENZA VACCINE  Completed  . PNA vac Low Risk Adult  Completed    Lab Results  Component Value Date   WBC 4.2 12/29/2016   HGB 12.8 12/29/2016   HCT 38.7 12/29/2016   PLT 203.0 12/29/2016   GLUCOSE 87 01/26/2017   CHOL 168 12/29/2016   TRIG 72.0 12/29/2016   HDL 76.90 12/29/2016   LDLCALC 77 12/29/2016   ALT 16 12/29/2016   AST 23 12/29/2016   NA 136 01/26/2017   K 3.7 01/26/2017   CL 100 01/26/2017   CREATININE 0.62 01/26/2017   BUN 11 01/26/2017   CO2 30 01/26/2017   TSH 1.05 03/11/2009   HGBA1C 5.5 08/20/2013   MICROALBUR 0.9 09/02/2014    Lab Results  Component Value Date   TSH 1.05 03/11/2009   Lab Results  Component Value Date   WBC 4.2 12/29/2016   HGB 12.8 12/29/2016   HCT 38.7 12/29/2016   MCV 90.8 12/29/2016   PLT 203.0 12/29/2016   Lab Results  Component Value Date   NA 136 01/26/2017   K 3.7 01/26/2017   CO2 30 01/26/2017   GLUCOSE 87 01/26/2017   BUN 11 01/26/2017   CREATININE 0.62 01/26/2017   BILITOT 0.5 12/29/2016   ALKPHOS 51 12/29/2016   AST 23 12/29/2016   ALT 16 12/29/2016   PROT 6.5 12/29/2016   ALBUMIN 3.9 12/29/2016   CALCIUM 9.3 01/26/2017   GFR 97.24 01/26/2017   Lab Results  Component Value Date   CHOL 168 12/29/2016   Lab Results  Component Value Date   HDL 76.90 12/29/2016   Lab Results  Component Value Date   LDLCALC 77 12/29/2016   Lab Results  Component Value Date   TRIG 72.0 12/29/2016   Lab Results  Component Value Date   CHOLHDL 2 12/29/2016   Lab Results  Component Value Date   HGBA1C 5.5 08/20/2013         Assessment & Plan:   Problem List Items Addressed This Visit      Unprioritized    Essential hypertension - Primary   Relevant Medications   irbesartan (AVAPRO) 300 MG tablet   amLODipine (NORVASC) 5 MG tablet   Other Relevant Orders   US RENAL ARTERY DUPLEX COMPLETE         Inc avapro to 300 mg  And add norvasc 5 mg qd  F/u 1 week for recheck  Check renal US -- r/o RAS Call or rto prn   I have discontinued Ms. Verdejo's irbesartan. I am also having her start on irbesartan and amLODipine. Additionally, I am having her maintain her loratadine, pseudoephedrine, cholecalciferol, MAGNESIUM PO, multivitamin-lutein, calcium citrate-vitamin D, Ginkgo Biloba (GINKGO PO), glucosamine-chondroitin, Misc Natural Products (LUTEIN 20 PO), b complex vitamins, LORazepam, meloxicam, meclizine, latanoprost, atenolol, and fluticasone.  Meds ordered this encounter  Medications  . irbesartan (AVAPRO) 300 MG tablet    Sig: Take 1 tablet (300 mg total) by mouth daily.    Dispense:  30 tablet    Refill:  2  . amLODipine (NORVASC) 5 MG tablet    Sig: Take 1 tablet (5 mg total) by mouth daily.    Dispense:  30 tablet    Refill:  3    CMA served as scribe during this visit. History, Physical and Plan performed by medical provider. Documentation and orders reviewed and attested to.  [name], DO

## 2017-04-10 NOTE — Telephone Encounter (Signed)
Pt informed can go to ED or appt [place] pt chose appt 1100 states by the time she gets checked in ED on first floor and a dr actually looks at her is not much different in time as coming to see [place]. BP elevated Sat and Sun so she doubled dose those days. Pt states 182/86 this am. Again, pt will be here in two hours for 1100 appt [place].

## 2017-04-14 ENCOUNTER — Other Ambulatory Visit: Payer: Self-pay | Admitting: Family Medicine

## 2017-04-14 ENCOUNTER — Ambulatory Visit (HOSPITAL_BASED_OUTPATIENT_CLINIC_OR_DEPARTMENT_OTHER)
Admission: RE | Admit: 2017-04-14 | Discharge: 2017-04-14 | Disposition: A | Discharge status: Home / Self Care | Payer: Medicare Other | Source: Ambulatory Visit | Attending: Family Medicine | Admitting: Family Medicine

## 2017-04-14 DIAGNOSIS — I10 Essential (primary) hypertension: Secondary | ICD-10-CM | POA: Diagnosis not present

## 2017-04-16 ENCOUNTER — Other Ambulatory Visit: Payer: Self-pay | Admitting: Family Medicine

## 2017-04-16 DIAGNOSIS — R9389 Abnormal findings on diagnostic imaging of other specified body structures: Secondary | ICD-10-CM

## 2017-04-18 ENCOUNTER — Ambulatory Visit: Payer: Medicare Other | Admitting: Family Medicine

## 2017-04-18 ENCOUNTER — Encounter: Payer: Self-pay | Admitting: Family Medicine

## 2017-04-18 VITALS — BP 166/67 | HR 48 | Temp 98.0°F | Resp 16 | Ht 60.0 in | Wt 117.0 lb

## 2017-04-18 DIAGNOSIS — I10 Essential (primary) hypertension: Secondary | ICD-10-CM

## 2017-04-18 MED ORDER — AMLODIPINE BESYLATE 10 MG PO TABS: 10.0000 mg | ORAL_TABLET | Freq: Every day | ORAL | 0 refills | Status: DC

## 2017-04-18 NOTE — Progress Notes (Signed)
Patient ID: [name], female   DOB: [birth year], 82 y.o.   MRN: [id number]     Subjective:  I acted as a Education administrator for Dr. Carollee Herter.  Guerry Bruin, [place]   Patient ID: [name], female    DOB: 07-05-1929, 83 y.o.   MRN: [id number]  Chief Complaint  Patient presents with  . Hypertension    HPI  Patient is in today for follow up blood pressure.  Still running high.  This morning reading was 170/80.  Started new dose of medication last week.     Patient Care Team: Carollee Herter, Alferd Apa, DO as PCP - General Marygrace Drought, MD as Consulting Physician (Ophthalmology)   Past Medical History:  Diagnosis Date  . Allergy   . Cancer ([place])    breast,hx of  . Diverticulosis   . Glaucoma   . Hypertension   . Osteoporosis   . Poliomyelitis   . Vertigo    dizziness    Past Surgical History:  Procedure Laterality Date  . BREAST SURGERY    . DILATION AND CURETTAGE OF UTERUS     x3  . ganglion cyst r wrist    . LUMBAR LAMINECTOMY    . SPINE SURGERY  11/2010   fusion--[facility]    Family History  Problem Relation Age of Onset  . Hypertension Father   . Kidney disease Father   . Hypertension Sister   . Coronary artery disease Other        P uncles    Social History   Socioeconomic History  . Marital status: Married    Spouse name: Not on file  . Number of children: Not on file  . Years of education: Not on file  . Highest education level: Not on file  Social Needs  . Financial resource strain: Not on file  . Food insecurity - worry: Not on file  . Food insecurity - inability: Not on file  . Transportation needs - medical: Not on file  . Transportation needs - non-medical: Not on file  Occupational History  . 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 Smoking status: Never Smoker  . Smokeless tobacco: Never Used  Substance and Sexual Activity  . Alcohol use: No  . Drug use: No  . Sexual activity: Yes    Partners: Male  Other  Topics Concern  . Not on file  Social History Narrative   Exercise-- walking 1 x a week    Outpatient Medications Prior to Visit  Medication Sig Dispense Refill  . atenolol (TENORMIN) 50 MG tablet Take 1.5 tablets (75 mg total) by mouth daily. 135 tablet 1  . b complex vitamins tablet Take 1 tablet by mouth daily.    . calcium citrate-vitamin D (CITRACAL+D) 315-200 MG-UNIT per tablet Take 1 tablet by mouth 3 (three) times daily.    . cholecalciferol (VITAMIN D) 1000 UNITS tablet Take 1,000 Units by mouth daily.      . fluticasone (FLONASE) 50 MCG/ACT nasal spray Place 2 sprays into both nostrils daily. 16 g 6  . Ginkgo Biloba (GINKGO PO) Take 1 tablet by mouth daily.    Marland Kitchen glucosamine-chondroitin 500-400 MG tablet Take 1 tablet by mouth 3 (three) times daily.    . irbesartan (AVAPRO) 300 MG tablet Take 1 tablet (300 mg total) by mouth daily. 30 tablet 2  . latanoprost (XALATAN) 0.005 % ophthalmic solution Place 1 drop into both eyes at bedtime.    Marland Kitchen  loratadine (CLARITIN) 10 MG tablet Take 10 mg by mouth daily.      Marland Kitchen LORazepam (ATIVAN) 1 MG tablet TAKE 1 TABLET EVERY 8 HOURS 60 tablet 0  . MAGNESIUM PO Take 500 mg by mouth daily.     . meclizine (ANTIVERT) 25 MG tablet TAKE 1 TABLET BY MOUTH 3 TIMES A DAY AS NEEDED 60 tablet 0  . meloxicam (MOBIC) 15 MG tablet TAKE 1/2 TO 1 TABLET BY  MOUTH EVERY DAY AS NEEDED 90 tablet 2  . Misc Natural Products (LUTEIN 20 PO) Take 1 capsule by mouth daily.    . multivitamin-lutein (OCUVITE-LUTEIN) CAPS Take 1 capsule by mouth daily.    Marland Kitchen amLODipine (NORVASC) 5 MG tablet Take 1 tablet (5 mg total) by mouth daily. 30 tablet 3  . pseudoephedrine (SUDAFED) 30 MG tablet Take 30 mg by mouth as needed.       No facility-administered medications prior to visit.     Allergies  Allergen Reactions  . Amoxicillin Diarrhea    "I had diarrhea really bad."  . Hydrocodone   . Plendil [Felodipine]     Caused pains in back and stomach and caused constipation  .  Propoxyphene N-Acetaminophen   . Tenex [Guanfacine Hcl]     Made patient feel really bad and was ineffective in decreasing BP    Review of Systems  Constitutional: Negative for fever and malaise/fatigue.  HENT: Negative for congestion.   Eyes: Negative for blurred vision.  Respiratory: Negative for cough and shortness of breath.   Cardiovascular: Negative for chest pain, palpitations and leg swelling.  Gastrointestinal: Negative for vomiting.  Musculoskeletal: Negative for back pain.  Skin: Negative for rash.  Neurological: Negative for dizziness, loss of consciousness and headaches.       Objective:    Physical Exam  Constitutional: She is oriented to person, place, and time. She appears well-developed and well-nourished.  HENT:  Head: Normocephalic and atraumatic.  Eyes: Conjunctivae and EOM are normal.  Neck: Normal range of motion. Neck supple. No JVD present. Carotid bruit is not present. No thyromegaly present.  Cardiovascular: Normal rate, regular rhythm and normal heart sounds.  No murmur heard. Pulmonary/Chest: Effort normal and breath sounds normal. No respiratory distress. She has no wheezes. She has no rales. She exhibits no tenderness.  Musculoskeletal: She exhibits no edema.  Neurological: She is alert and oriented to person, place, and time.  Psychiatric: She has a normal mood and affect.  Nursing note and vitals reviewed.   BP (!) 166/67   Pulse (!) 48   Temp 98 F (36.7 C) (Oral)   Resp 16   Ht 5' (1.524 m)   Wt 117 lb (53.1 kg)   SpO2 97%   BMI 22.85 kg/m  Wt Readings from Last 3 Encounters:  04/18/17 117 lb (53.1 kg)  04/10/17 117 lb 12.8 oz (53.4 kg)  03/31/17 121 lb (54.9 kg)   BP Readings from Last 3 Encounters:  04/18/17 (!) 166/67  04/10/17 (!) 171/71  03/31/17 (!) 170/70     Immunization History  Administered Date(s) Administered  . Influenza Split 04/27/2011, 03/27/2012  . Influenza Whole 05/03/2007, 03/07/2008, 03/11/2009,  03/16/2010, 04/27/2010  . Influenza, High Dose Seasonal PF 04/09/2013, 03/28/2016, 03/17/2017  . Influenza,inj,Quad PF,6+ Mos 03/20/2014, 03/20/2015  . Pneumococcal Conjugate-13 03/20/2015  . Pneumococcal Polysaccharide-23 03/11/2009    Health Maintenance  Topic Date Due  . TETANUS/TDAP  02/04/1951  . MAMMOGRAM  10/25/2017  . DEXA SCAN  10/26/2018  . INFLUENZA  VACCINE  Completed  . PNA vac Low Risk Adult  Completed    Lab Results  Component Value Date   WBC 4.2 12/29/2016   HGB 12.8 12/29/2016   HCT 38.7 12/29/2016   PLT 203.0 12/29/2016   GLUCOSE 87 01/26/2017   CHOL 168 12/29/2016   TRIG 72.0 12/29/2016   HDL 76.90 12/29/2016   LDLCALC 77 12/29/2016   ALT 16 12/29/2016   AST 23 12/29/2016   NA 136 01/26/2017   K 3.7 01/26/2017   CL 100 01/26/2017   CREATININE 0.62 01/26/2017   BUN 11 01/26/2017   CO2 30 01/26/2017   TSH 1.05 03/11/2009   HGBA1C 5.5 08/20/2013   MICROALBUR 0.9 09/02/2014    Lab Results  Component Value Date   TSH 1.05 03/11/2009   Lab Results  Component Value Date   WBC 4.2 12/29/2016   HGB 12.8 12/29/2016   HCT 38.7 12/29/2016   MCV 90.8 12/29/2016   PLT 203.0 12/29/2016   Lab Results  Component Value Date   NA 136 01/26/2017   K 3.7 01/26/2017   CO2 30 01/26/2017   GLUCOSE 87 01/26/2017   BUN 11 01/26/2017   CREATININE 0.62 01/26/2017   BILITOT 0.5 12/29/2016   ALKPHOS 51 12/29/2016   AST 23 12/29/2016   ALT 16 12/29/2016   PROT 6.5 12/29/2016   ALBUMIN 3.9 12/29/2016   CALCIUM 9.3 01/26/2017   GFR 97.24 01/26/2017   Lab Results  Component Value Date   CHOL 168 12/29/2016   Lab Results  Component Value Date   HDL 76.90 12/29/2016   Lab Results  Component Value Date   LDLCALC 77 12/29/2016   Lab Results  Component Value Date   TRIG 72.0 12/29/2016   Lab Results  Component Value Date   CHOLHDL 2 12/29/2016   Lab Results  Component Value Date   HGBA1C 5.5 08/20/2013         Assessment & Plan:   Problem  List Items Addressed This Visit      Unprioritized   Essential hypertension - Primary    Well controlled, no changes to meds. Encouraged heart healthy diet such as the DASH diet and exercise as tolerated.       Relevant Medications   amLODipine (NORVASC) 10 MG tablet      I have discontinued Leronda A. Schwegler's pseudoephedrine and amLODipine. I am also having her start on amLODipine. Additionally, I am having her maintain her loratadine, cholecalciferol, MAGNESIUM PO, multivitamin-lutein, calcium citrate-vitamin D, Ginkgo Biloba (GINKGO PO), glucosamine-chondroitin, Misc Natural Products (LUTEIN 20 PO), b complex vitamins, LORazepam, meloxicam, meclizine, latanoprost, atenolol, fluticasone, and irbesartan.  Meds ordered this encounter  Medications  . amLODipine (NORVASC) 10 MG tablet    Sig: Take 1 tablet (10 mg total) daily by mouth.    Dispense:  30 tablet    Refill:  0    CMA served as scribe during this visit. History, Physical and Plan performed by medical provider. Documentation and orders reviewed and attested to.  [name], DO

## 2017-04-18 NOTE — Patient Instructions (Signed)

## 2017-04-18 NOTE — Assessment & Plan Note (Signed)
Well controlled, no changes to meds. Encouraged heart healthy diet such as the DASH diet and exercise as tolerated.  °

## 2017-04-21 ENCOUNTER — Ambulatory Visit: Payer: Medicare Other

## 2017-04-27 ENCOUNTER — Telehealth: Payer: Self-pay

## 2017-04-27 ENCOUNTER — Ambulatory Visit (HOSPITAL_BASED_OUTPATIENT_CLINIC_OR_DEPARTMENT_OTHER)
Admission: RE | Admit: 2017-04-27 | Discharge: 2017-04-27 | Disposition: A | Discharge status: Home / Self Care | Payer: Medicare Other | Source: Ambulatory Visit | Attending: Family Medicine | Admitting: Family Medicine

## 2017-04-27 ENCOUNTER — Encounter (HOSPITAL_BASED_OUTPATIENT_CLINIC_OR_DEPARTMENT_OTHER): Payer: Self-pay | Admitting: Radiology

## 2017-04-27 DIAGNOSIS — R9389 Abnormal findings on diagnostic imaging of other specified body structures: Secondary | ICD-10-CM | POA: Diagnosis not present

## 2017-04-27 DIAGNOSIS — N83291 Other ovarian cyst, right side: Secondary | ICD-10-CM

## 2017-04-27 NOTE — Telephone Encounter (Signed)
Complex cyst --- need MRI pelvis Refer to gyn as well

## 2017-04-27 NOTE — Telephone Encounter (Signed)
Spoke w/ Pt, informed of results and recommendations. She informed that she does have "metal" in her back from 2 previous surgeries and is unsure if MRI can be completed. She did agree to see GYN- she see's Dr. Teryl Lucy (referral placed). Informed her I would discuss MRI w/ PCP. Pt verbalized understanding.

## 2017-04-27 NOTE — Telephone Encounter (Signed)
Spoke w/ [facility];   Transvaginal ultrasound: 12 mm complex cyst with a calcified rim in the right ovary.  Recommending MRI of pelvis.

## 2017-04-27 NOTE — Telephone Encounter (Signed)
We will have her see the GyN and send the results to her Hold off on mri

## 2017-04-28 NOTE — Telephone Encounter (Signed)
Spoke w/ Pt, informed of recommendations. Pt verbalized understanding.  

## 2017-05-03 DIAGNOSIS — R9389 Abnormal findings on diagnostic imaging of other specified body structures: Secondary | ICD-10-CM | POA: Diagnosis not present

## 2017-05-09 ENCOUNTER — Other Ambulatory Visit: Payer: Self-pay | Admitting: Family Medicine

## 2017-05-09 ENCOUNTER — Ambulatory Visit (INDEPENDENT_AMBULATORY_CARE_PROVIDER_SITE_OTHER): Payer: Medicare Other | Admitting: Family Medicine

## 2017-05-09 ENCOUNTER — Other Ambulatory Visit: Payer: Self-pay

## 2017-05-09 VITALS — BP 165/75 | HR 75

## 2017-05-09 DIAGNOSIS — I10 Essential (primary) hypertension: Secondary | ICD-10-CM | POA: Diagnosis not present

## 2017-05-09 MED ORDER — HYDROCHLOROTHIAZIDE 25 MG PO TABS: 25.0000 mg | ORAL_TABLET | Freq: Every day | ORAL | 3 refills | Status: DC

## 2017-05-09 MED ORDER — IRBESARTAN 300 MG PO TABS: 300.0000 mg | ORAL_TABLET | Freq: Every day | ORAL | 2 refills | Status: DC

## 2017-05-09 NOTE — Progress Notes (Addendum)
Pre visit review using our clinic tool,if applicable. No additional management support is needed unless otherwise documented below in the visit note.   [name] per order from Dr. Carollee Herter dated 11/*6/18.  Patient voices no complaints this visit. States she has taken 2 of her 3 BP medications today takes the 3rd medication during the pm.  BP last visit was 166/67  Amlodipine 10 mg added to medications.  BP today = 165/75 P=75  Per Dr. Carollee Herter patient to start Hctz 25 mg daily  And return in 2-3 weeks. Appointment scheduled.

## 2017-05-11 ENCOUNTER — Encounter: Payer: Self-pay | Admitting: Family Medicine

## 2017-05-24 ENCOUNTER — Encounter: Payer: Self-pay | Admitting: Family Medicine

## 2017-05-24 ENCOUNTER — Other Ambulatory Visit: Payer: Self-pay

## 2017-05-24 ENCOUNTER — Ambulatory Visit (INDEPENDENT_AMBULATORY_CARE_PROVIDER_SITE_OTHER): Payer: Medicare Other | Admitting: Family Medicine

## 2017-05-24 VITALS — BP 149/75 | HR 69

## 2017-05-24 DIAGNOSIS — I10 Essential (primary) hypertension: Secondary | ICD-10-CM

## 2017-05-24 MED ORDER — AMLODIPINE BESYLATE 10 MG PO TABS: 10.0000 mg | ORAL_TABLET | Freq: Every day | ORAL | 0 refills | Status: DC

## 2017-05-24 NOTE — Progress Notes (Signed)
Noted. Given age, reasonable goal would be <150/90. Agree with above.  [place], DO 05/24/17 4:52 PM

## 2017-05-24 NOTE — Progress Notes (Signed)
Pre visit review using our clinic tool,if applicable. No additional management support is needed unless otherwise documented below in the visit note.   [name] per order from Dr. Carollee Herter dated 05/09/17.  Patient started on Hctz 25 mg for BP elevation on last visit. States she took a few days and had to stop because she started feeling bad. Asked patient if she drinks a lot of fluids states she does not and will not start at her age.  States her BPs at home have been running 120/7-130/70.    BP today = 149/75  P=69  Per Dr.Wendling  Patient can d/c Hctz since patient refuses to take it. [name] as ordered and return in 1 month foe BP check. Have patient bring BP cuff to next [place]. Patient agreed.  Patient states she will call back and reschedule her appointment. Will be having biopsy soon and will wait until after that to schedule.

## 2017-06-12 ENCOUNTER — Encounter: Payer: Self-pay | Admitting: Nurse Practitioner

## 2017-06-12 ENCOUNTER — Ambulatory Visit (INDEPENDENT_AMBULATORY_CARE_PROVIDER_SITE_OTHER): Payer: Medicare Other | Admitting: Nurse Practitioner

## 2017-06-12 VITALS — BP 138/66 | HR 88 | Temp 97.9°F | Ht 60.0 in | Wt 120.0 lb

## 2017-06-12 DIAGNOSIS — J069 Acute upper respiratory infection, unspecified: Secondary | ICD-10-CM | POA: Diagnosis not present

## 2017-06-12 LAB — POC INFLUENZA A&B (BINAX/QUICKVUE)
Influenza A, POC: NEGATIVE
Influenza B, POC: NEGATIVE

## 2017-06-12 MED ORDER — PROMETHAZINE-DM 6.25-15 MG/5ML PO SYRP: 2.5000 mL | ORAL_SOLUTION | Freq: Three times a day (TID) | ORAL | 0 refills | Status: DC | PRN

## 2017-06-12 MED ORDER — GUAIFENESIN ER 600 MG PO TB12: 600.0000 mg | ORAL_TABLET | Freq: Two times a day (BID) | ORAL | 0 refills | Status: DC | PRN

## 2017-06-12 NOTE — Patient Instructions (Signed)

## 2017-06-12 NOTE — Progress Notes (Signed)
Subjective:  Patient ID: Cassidy Brown, female    DOB: 1929/07/11  Age: 83 y.o. MRN: [id number]  CC: Cough (coughing,lower grade fever,bodysore,chills,nausea at times/ going on 4 days. )   URI   This is a new problem. The current episode started in the past 7 days. The problem has been unchanged. Maximum temperature: 99. The fever has been present for 1 to 2 days. Associated symptoms include congestion, coughing, headaches, joint pain, nausea, a plugged ear sensation, rhinorrhea, sinus pain, sneezing and a sore throat. Pertinent negatives include no diarrhea, ear pain, swollen glands, vomiting or wheezing. She has tried NSAIDs and acetaminophen for the symptoms. The treatment provided mild relief.  reports benzonatate does not help with cough.  Outpatient Medications Prior to Visit  Medication Sig Dispense Refill  . amLODipine (NORVASC) 10 MG tablet Take 1 tablet (10 mg total) by mouth daily. 30 tablet 0  . atenolol (TENORMIN) 50 MG tablet TAKE 1 AND 1/2 TABLETS BY  MOUTH DAILY 135 tablet 1  . b complex vitamins tablet Take 1 tablet by mouth daily.    . calcium citrate-vitamin D (CITRACAL+D) 315-200 MG-UNIT per tablet Take 1 tablet by mouth 3 (three) times daily.    . cholecalciferol (VITAMIN D) 1000 UNITS tablet Take 1,000 Units by mouth daily.      . fluticasone (FLONASE) 50 MCG/ACT nasal spray Place 2 sprays into both nostrils daily. 16 g 6  . Ginkgo Biloba (GINKGO PO) Take 1 tablet by mouth daily.    Marland Kitchen glucosamine-chondroitin 500-400 MG tablet Take 1 tablet by mouth 3 (three) times daily.    . hydrochlorothiazide (HYDRODIURIL) 25 MG tablet Take 1 tablet (25 mg total) by mouth daily. (Patient not taking: Reported on 05/24/2017) 90 tablet 3  . irbesartan (AVAPRO) 300 MG tablet Take 1 tablet (300 mg total) by mouth daily. 30 tablet 2  . latanoprost (XALATAN) 0.005 % ophthalmic solution Place 1 drop into both eyes at bedtime.    Marland Kitchen loratadine (CLARITIN) 10 MG tablet Take 10 mg by mouth daily.       Marland Kitchen LORazepam (ATIVAN) 1 MG tablet TAKE 1 TABLET EVERY 8 HOURS 60 tablet 0  . MAGNESIUM PO Take 500 mg by mouth daily.     . meclizine (ANTIVERT) 25 MG tablet TAKE 1 TABLET BY MOUTH 3 TIMES A DAY AS NEEDED 60 tablet 0  . meloxicam (MOBIC) 15 MG tablet TAKE 1/2 TO 1 TABLET BY  MOUTH EVERY DAY AS NEEDED 90 tablet 2  . Misc Natural Products (LUTEIN 20 PO) Take 1 capsule by mouth daily.    . multivitamin-lutein (OCUVITE-LUTEIN) CAPS Take 1 capsule by mouth daily.     No facility-administered medications prior to visit.     ROS See HPI  Objective:  BP 138/66   Pulse 88   Temp 97.9 F (36.6 C)   Ht 5' (1.524 m)   Wt 120 lb (54.4 kg)   SpO2 96%   BMI 23.44 kg/m   BP Readings from Last 3 Encounters:  06/12/17 138/66  05/24/17 (!) 149/75  05/09/17 (!) 165/75    Wt Readings from Last 3 Encounters:  06/12/17 120 lb (54.4 kg)  04/18/17 117 lb (53.1 kg)  04/10/17 117 lb 12.8 oz (53.4 kg)    Physical Exam  Constitutional: She is oriented to person, place, and time. No distress.  HENT:  Nose: Mucosal edema present.  Mouth/Throat: Posterior oropharyngeal erythema present.  Neck: Normal range of motion. Neck supple.  Cardiovascular: Normal rate  and regular rhythm.  Pulmonary/Chest: Effort normal and breath sounds normal.  Abdominal: Soft. Bowel sounds are normal. There is no tenderness.  Lymphadenopathy:    She has no cervical adenopathy.  Neurological: She is alert and oriented to person, place, and time.  Skin: Skin is warm and dry.  Psychiatric: She has a normal mood and affect. Her behavior is normal.  Vitals reviewed.   Lab Results  Component Value Date   WBC 4.2 12/29/2016   HGB 12.8 12/29/2016   HCT 38.7 12/29/2016   PLT 203.0 12/29/2016   GLUCOSE 87 01/26/2017   CHOL 168 12/29/2016   TRIG 72.0 12/29/2016   HDL 76.90 12/29/2016   LDLCALC 77 12/29/2016   ALT 16 12/29/2016   AST 23 12/29/2016   NA 136 01/26/2017   K 3.7 01/26/2017   CL 100 01/26/2017    CREATININE 0.62 01/26/2017   BUN 11 01/26/2017   CO2 30 01/26/2017   TSH 1.05 03/11/2009   HGBA1C 5.5 08/20/2013   MICROALBUR 0.9 09/02/2014    US Pelvis Transvanginal Non-ob (tv Only)  Result Date: 04/27/2017 CLINICAL DATA:  Thickened endometrium seen on recent renal ultrasound. EXAM: TRANSABDOMINAL AND TRANSVAGINAL ULTRASOUND OF PELVIS TECHNIQUE: Both transabdominal and transvaginal ultrasound examinations of the pelvis were performed. Transabdominal technique was performed for global imaging of the pelvis including uterus, ovaries, adnexal regions, and pelvic cul-de-sac. It was necessary to proceed with endovaginal exam following the transabdominal exam to visualize the endometrium and ovaries. COMPARISON:  None FINDINGS: Uterus Measurements: 7 x 3.3 x 4.2 cm. No focal uterine mass. Endometrium Thickness: 2.2 cm. Thickened heterogeneous endometrium without significant vascularity. Right ovary Measurements: 2.2 x 1.5 x 1.9 cm. Complex cystic lesion in the right ovary measuring up to 12 mm with a calcified rim. Left ovary Measurements: 2.0 x 1.0 x 1.7 cm. Normal appearance/no adnexal mass. Other findings No abnormal free fluid. IMPRESSION: 1. The endometrium is abnormal. It is thickened to 2.2 cm and heterogeneous. Endometrial neoplasm/malignancy should be considered. Recommend gynecologic consultation and endometrial biopsy. 2. 12 mm complex cyst with a calcified rim in the right ovary. This is nonspecific. This could be further assessed with a pelvic MRI. If MRI is not pursued, close ultrasound follow-up is recommended. These results will be called to the ordering clinician or representative by the Radiologist Assistant, and communication documented in the PACS or zVision Dashboard. Electronically Signed   By: Dorise Bullion III M.D   On: 04/27/2017 15:43   US Pelvis (transabdominal Only)  Result Date: 04/27/2017 CLINICAL DATA:  Thickened endometrium seen on recent renal ultrasound. EXAM:  TRANSABDOMINAL AND TRANSVAGINAL ULTRASOUND OF PELVIS TECHNIQUE: Both transabdominal and transvaginal ultrasound examinations of the pelvis were performed. Transabdominal technique was performed for global imaging of the pelvis including uterus, ovaries, adnexal regions, and pelvic cul-de-sac. It was necessary to proceed with endovaginal exam following the transabdominal exam to visualize the endometrium and ovaries. COMPARISON:  None FINDINGS: Uterus Measurements: 7 x 3.3 x 4.2 cm. No focal uterine mass. Endometrium Thickness: 2.2 cm. Thickened heterogeneous endometrium without significant vascularity. Right ovary Measurements: 2.2 x 1.5 x 1.9 cm. Complex cystic lesion in the right ovary measuring up to 12 mm with a calcified rim. Left ovary Measurements: 2.0 x 1.0 x 1.7 cm. Normal appearance/no adnexal mass. Other findings No abnormal free fluid. IMPRESSION: 1. The endometrium is abnormal. It is thickened to 2.2 cm and heterogeneous. Endometrial neoplasm/malignancy should be considered. Recommend gynecologic consultation and endometrial biopsy. 2. 12 mm complex cyst with  a calcified rim in the right ovary. This is nonspecific. This could be further assessed with a pelvic MRI. If MRI is not pursued, close ultrasound follow-up is recommended. These results will be called to the ordering clinician or representative by the Radiologist Assistant, and communication documented in the PACS or zVision Dashboard. Electronically Signed   By: Dorise Bullion III M.D   On: 04/27/2017 15:43    Assessment & Plan:   Laurita was seen today for cough.  Diagnoses and all orders for this visit:  Acute URI -     POC Influenza A&B(BINAX/QUICKVUE) -     guaiFENesin (MUCINEX) 600 MG 12 hr tablet; Take 1 tablet (600 mg total) by mouth 2 (two) times daily as needed for cough or to loosen phlegm. -     promethazine-dextromethorphan (PROMETHAZINE-DM) 6.25-15 MG/5ML syrup; Take 2.5 mLs by mouth 3 (three) times daily between meals as  needed for cough.   I am having Darlean A. Borrelli start on guaiFENesin and promethazine-dextromethorphan. I am also having her maintain her loratadine, cholecalciferol, MAGNESIUM PO, multivitamin-lutein, calcium citrate-vitamin D, Ginkgo Biloba (GINKGO PO), glucosamine-chondroitin, Misc Natural Products (LUTEIN 20 PO), b complex vitamins, LORazepam, meloxicam, meclizine, latanoprost, fluticasone, atenolol, hydrochlorothiazide, irbesartan, and amLODipine.  Meds ordered this encounter  Medications  . guaiFENesin (MUCINEX) 600 MG 12 hr tablet    Sig: Take 1 tablet (600 mg total) by mouth 2 (two) times daily as needed for cough or to loosen phlegm.    Dispense:  14 tablet    Refill:  0    Order Specific Question:   Supervising Provider    Answer:   Lucille Passy [[id number]]  . promethazine-dextromethorphan (PROMETHAZINE-DM) 6.25-15 MG/5ML syrup    Sig: Take 2.5 mLs by mouth 3 (three) times daily between meals as needed for cough.    Dispense:  118 mL    Refill:  0    Order Specific Question:   Supervising Provider    Answer:   Lucille Passy [[id number]]    Follow-up: Return if symptoms worsen or fail to improve.  Wilfred Lacy, NP

## 2017-06-16 ENCOUNTER — Ambulatory Visit (INDEPENDENT_AMBULATORY_CARE_PROVIDER_SITE_OTHER): Payer: Medicare Other | Admitting: Family Medicine

## 2017-06-16 ENCOUNTER — Encounter: Payer: Self-pay | Admitting: Family Medicine

## 2017-06-16 VITALS — BP 140/72 | HR 102 | Temp 98.6°F | Ht 60.0 in | Wt 123.1 lb

## 2017-06-16 DIAGNOSIS — J208 Acute bronchitis due to other specified organisms: Secondary | ICD-10-CM | POA: Diagnosis not present

## 2017-06-16 DIAGNOSIS — B9689 Other specified bacterial agents as the cause of diseases classified elsewhere: Secondary | ICD-10-CM

## 2017-06-16 DIAGNOSIS — R110 Nausea: Secondary | ICD-10-CM

## 2017-06-16 MED ORDER — ONDANSETRON HCL 4 MG PO TABS: 4.0000 mg | ORAL_TABLET | Freq: Three times a day (TID) | ORAL | 0 refills | Status: DC | PRN

## 2017-06-16 MED ORDER — AZITHROMYCIN 250 MG PO TABS: ORAL_TABLET | ORAL | 0 refills | Status: DC

## 2017-06-16 NOTE — Patient Instructions (Addendum)
Continue to push fluids, practice good hand hygiene, and cover your mouth if you cough.  If you start having worsening symptoms, shaking or shortness of breath, seek immediate care.  If things don't get better, call for an appointment on Monday.  Let us know if you need anything.

## 2017-06-16 NOTE — Progress Notes (Signed)
Chief Complaint  Patient presents with  . Ear Pain  . Generalized Body Aches  . Nausea    Cassidy Brown here for URI complaints. Here with husband.  Duration: 2 weeks  Associated symptoms: fevers (Tmax 99.3F), sinus congestion, rhinorrhea, ear pain, sore throat, shortness of breath, myalgia and cough Denies: sinus pain, itchy watery eyes, ear drainage, wheezing and rigors Treatment to date: Robitussin Sick contacts: No  ROS:  Const:+fevers HEENT: As noted in HPI Lungs: +cough  Past Medical History:  Diagnosis Date  . Allergy   . Cancer ([place])    breast,hx of  . Diverticulosis   . Glaucoma   . Hypertension   . Osteoporosis   . Poliomyelitis   . Vertigo    dizziness   Family History  Problem Relation Age of Onset  . Hypertension Father   . Kidney disease Father   . Hypertension Sister   . Coronary artery disease Other        P uncles    BP 140/72 (BP Location: Left Arm, Patient Position: Sitting, Cuff Size: Normal)   Pulse (!) 102   Temp 98.6 F (37 C) (Oral)   Ht 5' (1.524 m)   Wt 123 lb 2 oz (55.8 kg)   SpO2 93%   BMI 24.05 kg/m  General: Awake, alert, appears stated age HEENT: AT, [place], ears patent b/l and TM's neg, nares patent w/o discharge, pharynx pink and without exudates, MMM Neck: No masses or asymmetry Heart: RRR (HR~84 when I listened), no murmurs, no bruits Lungs: CTAB, no accessory muscle use Psych: Age appropriate judgment and insight, normal mood and affect  Acute bacterial bronchitis - Plan: azithromycin (ZITHROMAX) 250 MG tablet  Nausea - Plan: ondansetron (ZOFRAN) 4 MG tablet  Orders as above. Continue to push fluids, practice good hand hygiene, cover mouth when coughing. F/u prn- call for appt on Mon if no better. If starting to experience worsening symptoms, shaking, or shortness of breath, seek immediate care. Pt and her husband voiced understanding and agreement to the plan.  [place], DO 06/16/17 1:23 PM

## 2017-06-16 NOTE — Progress Notes (Signed)
Pre visit review using our clinic review tool, if applicable. No additional management support is needed unless otherwise documented below in the visit note. 

## 2017-06-19 ENCOUNTER — Ambulatory Visit: Payer: Self-pay

## 2017-06-19 ENCOUNTER — Other Ambulatory Visit: Payer: Self-pay | Admitting: Family Medicine

## 2017-06-19 DIAGNOSIS — R42 Dizziness and giddiness: Secondary | ICD-10-CM

## 2017-06-19 NOTE — Telephone Encounter (Signed)
Pt is calling about bilateral ankle and feet swelling. She feels it is from her amlodipine and stopped taking it yesterday and states her edema has improved. She is wanting advice on if she whould switch the amlodipine or continue taking it. She states that the dr said that she may note some swelling. Pt requesting call back.

## 2017-06-19 NOTE — Telephone Encounter (Signed)
Stop norvasc and change to cartia xt 120 mg #30  1 po qd, 2 refills bp check 2 weeks

## 2017-06-20 MED ORDER — DILTIAZEM HCL ER COATED BEADS 120 MG PO CP24: 120.0000 mg | ORAL_CAPSULE | Freq: Every day | ORAL | 2 refills | Status: DC

## 2017-06-20 NOTE — Telephone Encounter (Signed)
Patient notified and rx sent in.  She will call back to make appt for bp check

## 2017-06-20 NOTE — Addendum Note (Signed)
Addended by: Kem Boroughs D on: 06/20/2017 08:46 AM   Modules accepted: Orders

## 2017-06-23 ENCOUNTER — Other Ambulatory Visit: Payer: Self-pay | Admitting: Family Medicine

## 2017-06-23 ENCOUNTER — Telehealth: Payer: Self-pay | Admitting: Family Medicine

## 2017-06-23 DIAGNOSIS — J324 Chronic pansinusitis: Secondary | ICD-10-CM

## 2017-06-23 MED ORDER — LEVOFLOXACIN 500 MG PO TABS: 500.0000 mg | ORAL_TABLET | Freq: Every day | ORAL | 0 refills | Status: DC

## 2017-06-23 NOTE — Telephone Encounter (Signed)
She needs to be seen.

## 2017-06-23 NOTE — Telephone Encounter (Signed)
Patient requesting zithromax for stuffy right ear and sore throat.  You want her to come in?

## 2017-06-23 NOTE — Addendum Note (Signed)
Addended by: Kem Boroughs D on: 06/23/2017 04:43 PM   Modules accepted: Orders

## 2017-06-23 NOTE — Telephone Encounter (Signed)
rx resent to [facility].  Advised that she needs to be seen if she feels bad after this antibiotic

## 2017-06-23 NOTE — Telephone Encounter (Signed)
Pt states that she doesn't have a sore throat but does have a stuffy ear  That's swollen on the inside. Pt states that the antibiotics helped for her bronchitis but not her ear. I advised Pt to come in and be seen but she states that she doesn't drive and her husband is ill at this time. Pt asked what to take and we revised her Rx chart and she states that she's taking Robitussin but it's not helping her ear at all she also states taking sudafed as well. Pt states that she thinks the sudafed made it worst. Sinuses are hurting some but not bad but only on her right side with her ear. Will forward to provider to advise.

## 2017-06-23 NOTE — Telephone Encounter (Signed)
Copied from [place] [id number]. Topic: Quick Communication - See Telephone Encounter >> Jun 23, 2017  9:23 AM Oneta Rack wrote: CRM for notification. See Telephone encounter for:   06/23/17.  Relation to pt: self  Call back number: [phone] Pharmacy: [facility], [address] AT [address]  Reason for call:  Patient last seen 06/16/17 by Dr. Nani Ravens, patient requesting azithromycin (ZITHROMAX) 250 MG tablet refill, patient experiencing stuffy right ear and sore throat, please advise

## 2017-06-27 ENCOUNTER — Ambulatory Visit: Payer: Self-pay | Admitting: *Deleted

## 2017-06-27 NOTE — Telephone Encounter (Signed)
FYI. Appt with Percell Miller tomorrow.

## 2017-06-27 NOTE — Telephone Encounter (Signed)
Pt called regarding her b/p going up. She states her b/p med was change and it is not working as well as the last one. She is not experiencing n/v, h/a or blurred vision.  She is alert and oriented. Home care advice given to patient with verbal understanding. Appointment made for tomorrow.  Reason for Disposition . Systolic BP  >= 828 OR Diastolic >= 003  Answer Assessment - Initial Assessment Questions 1. BLOOD PRESSURE: "What is the blood pressure?" "Did you take at least two measurements 5 minutes apart?"     170/82 HR  67  178/90 HR 70 2. ONSET: "When did you take your blood pressure?"     now 3. HOW: "How did you obtain the blood pressure?" (e.g., visiting nurse, automatic home BP monitor)     Automatic home BP monitor 4. HISTORY: "Do you have a history of high blood pressure?"     yes 5. MEDICATIONS: "Are you taking any medications for blood pressure?" "Have you missed any doses recently?"     Yes, not missed any 6. OTHER SYMPTOMS: "Do you have any symptoms?" (e.g., headache, chest pain, blurred vision, difficulty breathing, weakness)     Slight headache 7. PREGNANCY: "Is there any chance you are pregnant?" "When was your last menstrual period?"     no  Protocols used: HIGH BLOOD PRESSURE-A-AH

## 2017-06-28 ENCOUNTER — Ambulatory Visit: Payer: Medicare Other | Admitting: Medical

## 2017-06-28 ENCOUNTER — Encounter: Payer: Self-pay | Admitting: Medical

## 2017-06-28 VITALS — BP 170/90 | HR 59 | Temp 98.3°F | Resp 16 | Ht 60.0 in | Wt 113.4 lb

## 2017-06-28 DIAGNOSIS — Z8612 Personal history of poliomyelitis: Secondary | ICD-10-CM | POA: Diagnosis not present

## 2017-06-28 DIAGNOSIS — I10 Essential (primary) hypertension: Secondary | ICD-10-CM | POA: Diagnosis not present

## 2017-06-28 MED ORDER — AZELASTINE HCL 0.1 % NA SOLN: 2.0000 | Freq: Two times a day (BID) | NASAL | 12 refills | Status: DC

## 2017-06-28 NOTE — Patient Instructions (Addendum)
Your blood pressure is high today.  Also your blood pressures have been running high at home.  I think you can take two cartia daily and continue your other blood pressure medications the same.  I think cartia 240 mg or 180 mg dose will get bp in range. Will try 240 mg first. If this is too strong then will taper back down to 180 mg.   Check blood pressure twice daily tomorrow and another time early Friday morning.  Please give me an update on your blood pressure readings Friday morning.  For your ear pressure I sent in Astelin nasal spray.  I want you to continue your Flonase.  If any deep ear pain occurs then we will need to write antibiotic.  But I do not think that is indicated presently.  Follow-up date to be determined after reviewing your blood pressure readings.

## 2017-06-28 NOTE — Progress Notes (Signed)
Subjective:    Patient ID: Cassidy Brown, female    DOB: 09-18-1929, 84 y.o.   MRN: [id number]  HPI  Pt in for bp follow up.  Pt had cartia xt 120 mg a day. She had swelling of ankle with amlodiine and decided to stop since she did not like that side effect.. Pt was given hctz in the past and does not want to be on any.   On review of chart also on irbesartan and atenolol.   Pt states yesterday her bp reading was 178/90. Later in day 129/70.  Over past 10 days she has been checking her bp 427-062 systolic. More in 150's per pt consistently over past 10 days or so..  With all her blood pressure reading has not reported any cardiac or neurologic signs or symptoms.  Pt states she was not stressed  or worried when her bp reading was 178/90.  Also some recent rt ear pressure and nasal congestion over last week. These symptoms started after treated for bronchitis. Pt was on flonase in the past but stopped it about one week ago.  No fevers, no chills or sweats.  No cough noted.     Review of Systems  Constitutional: Negative for chills, fatigue and fever.  HENT: Positive for congestion, ear pain and sinus pressure. Negative for sinus pain and sore throat.        Actually on discussion she describes more ear pressure.  Also some faint/possible sinus pressure.  Respiratory: Negative for cough, chest tightness, shortness of breath and wheezing.   Cardiovascular: Negative for chest pain and palpitations.  Gastrointestinal: Negative for abdominal distention and abdominal pain.  Musculoskeletal: Negative for back pain.  Skin: Negative for rash.  Neurological: Negative for dizziness, speech difficulty, weakness, light-headedness, numbness and headaches.  Hematological: Negative for adenopathy. Does not bruise/bleed easily.  Psychiatric/Behavioral: Negative for confusion.    Past Medical History:  Diagnosis Date  . Allergy   . Cancer ([place])    breast,hx of  . Diverticulosis   .  Glaucoma   . Hypertension   . Osteoporosis   . Poliomyelitis   . Vertigo    dizziness     Social History   Socioeconomic History  . Marital status: Married    Spouse name: Not on file  . Number of children: Not on file  . Years of education: Not on file  . Highest education level: Not on file  Social Needs  . Financial resource strain: Not on file  . Food insecurity - worry: Not on file  . Food insecurity - inability: Not on file  . Transportation needs - medical: Not on file  . Transportation needs - non-medical: Not on file  Occupational History  . 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 Smoking status: Never Smoker  . Smokeless tobacco: Never Used  Substance and Sexual Activity  . Alcohol use: No  . Drug use: No  . Sexual activity: No    Partners: Male  Other Topics Concern  . Not on file  Social History Narrative   Exercise-- walking 1 x a week    Past Surgical History:  Procedure Laterality Date  . BREAST SURGERY Left    Lumpectomy  . DILATION AND CURETTAGE OF UTERUS     x3  . ganglion cyst r wrist    . LUMBAR LAMINECTOMY    . SPINE SURGERY  11/2010   fusion--[facility]  Family History  Problem Relation Age of Onset  . Hypertension Father   . Kidney disease Father   . Hypertension Sister   . Coronary artery disease Other        P uncles    Allergies  Allergen Reactions  . Amoxicillin Diarrhea    "I had diarrhea really bad."  . Hydrocodone   . Plendil [Felodipine]     Caused pains in back and stomach and caused constipation  . Propoxyphene N-Acetaminophen   . Tenex [Guanfacine Hcl]     Made patient feel really bad and was ineffective in decreasing BP    Current Outpatient Medications on File Prior to Visit  Medication Sig Dispense Refill  . atenolol (TENORMIN) 50 MG tablet TAKE 1 AND 1/2 TABLETS BY  MOUTH DAILY 135 tablet 1  . azithromycin (ZITHROMAX) 250 MG tablet Take 2 tabs the first day and then 1  tab daily until you run out. 6 tablet 0  . b complex vitamins tablet Take 1 tablet by mouth daily.    . calcium citrate-vitamin D (CITRACAL+D) 315-200 MG-UNIT per tablet Take 1 tablet by mouth 3 (three) times daily.    . cholecalciferol (VITAMIN D) 1000 UNITS tablet Take 1,000 Units by mouth daily.      . fluticasone (FLONASE) 50 MCG/ACT nasal spray Place 2 sprays into both nostrils daily. 16 g 6  . Ginkgo Biloba (GINKGO PO) Take 1 tablet by mouth daily.    Marland Kitchen glucosamine-chondroitin 500-400 MG tablet Take 1 tablet by mouth 3 (three) times daily.    Marland Kitchen guaiFENesin (MUCINEX) 600 MG 12 hr tablet Take 1 tablet (600 mg total) by mouth 2 (two) times daily as needed for cough or to loosen phlegm. 14 tablet 0  . hydrochlorothiazide (HYDRODIURIL) 25 MG tablet Take 1 tablet (25 mg total) by mouth daily. 90 tablet 3  . irbesartan (AVAPRO) 300 MG tablet Take 1 tablet (300 mg total) by mouth daily. 30 tablet 2  . latanoprost (XALATAN) 0.005 % ophthalmic solution Place 1 drop into both eyes at bedtime.    Marland Kitchen levofloxacin (LEVAQUIN) 500 MG tablet Take 1 tablet (500 mg total) by mouth daily. 7 tablet 0  . loratadine (CLARITIN) 10 MG tablet Take 10 mg by mouth daily.      Marland Kitchen LORazepam (ATIVAN) 1 MG tablet TAKE 1 TABLET EVERY 8 HOURS 60 tablet 0  . MAGNESIUM PO Take 500 mg by mouth daily.     . meclizine (ANTIVERT) 25 MG tablet TAKE 1 TABLET BY MOUTH 3 TIMES A DAY AS NEEDED 60 tablet 0  . meloxicam (MOBIC) 15 MG tablet TAKE 1/2 TO 1 TABLET BY  MOUTH EVERY DAY AS NEEDED 90 tablet 2  . Misc Natural Products (LUTEIN 20 PO) Take 1 capsule by mouth daily.    . multivitamin-lutein (OCUVITE-LUTEIN) CAPS Take 1 capsule by mouth daily.    . ondansetron (ZOFRAN) 4 MG tablet Take 1 tablet (4 mg total) by mouth every 8 (eight) hours as needed for nausea or vomiting. 20 tablet 0  . promethazine-dextromethorphan (PROMETHAZINE-DM) 6.25-15 MG/5ML syrup Take 2.5 mLs by mouth 3 (three) times daily between meals as needed for cough.  118 mL 0  . [DISCONTINUED] diltiazem (CARTIA XT) 120 MG 24 hr capsule Take 1 capsule (120 mg total) by mouth daily. 30 capsule 2   No current facility-administered medications on file prior to visit.     BP (!) 170/90   Pulse (!) 59   Temp 98.3 F (36.8 C) (Oral)  Resp 16   Ht 5' (1.524 m)   Wt 113 lb 6.4 oz (51.4 kg)   SpO2 98%   BMI 22.15 kg/m       Objective:   Physical Exam  General  Mental Status - Alert. General Appearance - Well groomed. Not in acute distress.  Skin Rashes- No Rashes.  HEENT Head- Normal. Ear Auditory Canal - Left- Normal. Right - Normal.Tympanic Membrane- Left- Normal. Right- Normal. Eye Sclera/Conjunctiva- Left- Normal. Right- Normal. Nose & Sinuses Nasal Mucosa- Left-  Boggy and Congested. Right-  Boggy and  Congested.Bilateral maxillary and frontal sinus pressure. Mouth & Throat Lips: Upper Lip- Normal: no dryness, cracking, pallor, cyanosis, or vesicular eruption. Lower Lip-Normal: no dryness, cracking, pallor, cyanosis or vesicular eruption. Buccal Mucosa- Bilateral- No Aphthous ulcers. Oropharynx- No Discharge or Erythema. Tonsils: Characteristics- Bilateral- No Erythema or Congestion. Size/Enlargement- Bilateral- No enlargement. Discharge- bilateral-None.  Neck Neck- Supple. No Masses.   Chest and Lung Exam Auscultation: Breath Sounds:-Clear even and unlabored.  Cardiovascular Auscultation:Rythm- Regular, rate and rhythm. Murmurs & Other Heart Sounds:Ausculatation of the heart reveal- No Murmurs.  Lymphatic Head & Neck General Head & Neck Lymphatics: Bilateral: Description- No Localized lymphadenopathy.      Assessment & Plan:  Your blood pressure is high today.  Also your blood pressures have been running high at home.  I think you can take two cartia daily and continue your other blood pressure medications the same.  I think cartia 240 mg or 180 mg dose will get bp in range. Will try 240 mg first. If this is too strong  then will taper back down to 180 mg.   Check blood pressure twice daily tomorrow and another time early Friday morning.  Please give me an update on your blood pressure readings Friday morning.  For your ear pressure I sent in Astelin nasal spray.  I want you to continue your Flonase.  If any deep ear pain occurs then we will need to write antibiotic.  But I do not think that is indicated presently.  Follow-up date to be determined after reviewing your blood pressure readings.  , Percell Miller, PA-C

## 2017-06-30 ENCOUNTER — Telehealth: Payer: Self-pay | Admitting: Medical

## 2017-06-30 NOTE — Telephone Encounter (Signed)
FYI

## 2017-06-30 NOTE — Telephone Encounter (Signed)
Copied from [place] [id number]. Topic: General - Other >> Jun 30, 2017 10:14 AM Darl Householder, RMA wrote: Reason for CRM: Patient is calling to notify Mackie Pai, [facility] of BP readings: Thursday 06/29/17 139/73 and evening 161/81 Friday Morning 06/30/17 137/68 Patient states she was put on another BP meds and needed to call in BP reading results

## 2017-06-30 NOTE — Telephone Encounter (Signed)
Has been using Cartia 240 mg.  She is basically taking 2 of her 120 mg tablets.  Her blood pressures are overall better.  She feels fine.  She is going to continue on the higher dose and update as before she runs out.  They  will refill the prescription likely at the 240 mg dose.  There is another incremental lower dose but I did not want her to fill a prescription that would be inadequate.

## 2017-07-06 ENCOUNTER — Other Ambulatory Visit: Payer: Self-pay | Admitting: Family Medicine

## 2017-07-06 ENCOUNTER — Telehealth: Payer: Self-pay | Admitting: Family Medicine

## 2017-07-06 DIAGNOSIS — I10 Essential (primary) hypertension: Secondary | ICD-10-CM

## 2017-07-06 MED ORDER — DILTIAZEM HCL ER COATED BEADS 240 MG PO CP24: 240.0000 mg | ORAL_CAPSULE | Freq: Every day | ORAL | 0 refills | Status: DC

## 2017-07-06 MED ORDER — DILTIAZEM HCL ER COATED BEADS 240 MG PO CP24: 240.0000 mg | ORAL_CAPSULE | Freq: Every day | ORAL | 1 refills | Status: DC

## 2017-07-06 NOTE — Telephone Encounter (Signed)
Copied from [place]. Topic: Quick Communication - See Telephone Encounter >> Jul 06, 2017 10:08 AM Boyd Kerbs wrote: CRM for notification. See Telephone encounter for:   Patient was told to call in when BP medication was almost out.  The increase in dosage  Last week 137/73, yesterday 162/80,  today 168/80, no other symptoms.   Taking Cartia 240mg   now. She has enough for Saturday 1/26,  she was not sure if doctor was increasing or not.  She did see Mackie Pai  Please call into:   [facility], [address] AT [facility] [address] [place] [address] Phone: [phone] Fax: [phone]   07/06/17.

## 2017-07-06 NOTE — Telephone Encounter (Signed)
Pt asking for refill of  BP medication, Cartia 240mg  due to increase in dosage from previous BP readings.

## 2017-07-06 NOTE — Telephone Encounter (Signed)
I sent it in 

## 2017-07-06 NOTE — Telephone Encounter (Signed)
Are you ok with sending in 240mg .  See previous phone message from [place].

## 2017-07-07 NOTE — Telephone Encounter (Signed)
Patient notified

## 2017-07-10 DIAGNOSIS — N8500 Endometrial hyperplasia, unspecified: Secondary | ICD-10-CM | POA: Diagnosis not present

## 2017-07-10 DIAGNOSIS — R9389 Abnormal findings on diagnostic imaging of other specified body structures: Secondary | ICD-10-CM | POA: Diagnosis not present

## 2017-07-11 ENCOUNTER — Telehealth: Payer: Self-pay

## 2017-07-11 DIAGNOSIS — H938X1 Other specified disorders of right ear: Secondary | ICD-10-CM

## 2017-07-11 DIAGNOSIS — R040 Epistaxis: Secondary | ICD-10-CM

## 2017-07-11 NOTE — Telephone Encounter (Signed)
We can do that but if she thinks it is wax we can try to get it out first

## 2017-07-11 NOTE — Telephone Encounter (Signed)
Okay to place referral

## 2017-07-11 NOTE — Telephone Encounter (Signed)
Copied from [place] [id number]. Topic: Referral - Request >> Jul 11, 2017 10:19 AM Synthia Innocent wrote: Reason for CRM: Requesting referral to ENT, [facility], Right ear/nasal cavity is stopped up.

## 2017-07-12 NOTE — Telephone Encounter (Signed)
Patient wants to see specialist.  Her right ear feels stopped up and her right nostril bleeds, has dried blood.

## 2017-07-12 NOTE — Telephone Encounter (Signed)
Referral placed.

## 2017-07-20 ENCOUNTER — Ambulatory Visit (INDEPENDENT_AMBULATORY_CARE_PROVIDER_SITE_OTHER): Payer: Medicare Other | Admitting: Family Medicine

## 2017-07-20 ENCOUNTER — Encounter: Payer: Self-pay | Admitting: Family Medicine

## 2017-07-20 VITALS — BP 178/76 | HR 61 | Temp 98.0°F | Resp 16 | Ht 60.0 in | Wt 113.4 lb

## 2017-07-20 DIAGNOSIS — I10 Essential (primary) hypertension: Secondary | ICD-10-CM

## 2017-07-20 DIAGNOSIS — F419 Anxiety disorder, unspecified: Secondary | ICD-10-CM

## 2017-07-20 HISTORY — DX: Anxiety disorder, unspecified: F41.9

## 2017-07-20 MED ORDER — SERTRALINE HCL 50 MG PO TABS: 50.0000 mg | ORAL_TABLET | Freq: Every day | ORAL | 3 refills | Status: DC

## 2017-07-20 NOTE — Patient Instructions (Signed)

## 2017-07-20 NOTE — Assessment & Plan Note (Signed)
Poorly controlled , encouraged DASH diet, minimize caffeine and obtain adequate sleep. Report concerning symptoms and follow up as directed and as needed Pt has been extremely stressed and bp comes down with ativan rto 2-3 weeks

## 2017-07-20 NOTE — Assessment & Plan Note (Signed)
Start zoloft rto 2-3 weeks

## 2017-07-20 NOTE — Progress Notes (Signed)
Subjective:  I acted as a Education administrator for Dr Verdie Mosher, RMA   Patient ID: [name], female    DOB: 01-25-1930, 84 y.o.   MRN: [id number]  Chief Complaint  Patient presents with  . surgical clearance    HPI  Patient is in today for bp check.  She is waiting to have a d and C and need to get her bp down.   She is very stressed about it and this makes her bp go up even higher.    Patient Care Team: Carollee Herter, Alferd Apa, DO as PCP - General Marygrace Drought, MD as Consulting Physician (Ophthalmology)   Past Medical History:  Diagnosis Date  . Allergy   . Cancer ([place])    breast,hx of  . Diverticulosis   . Glaucoma   . Hypertension   . Osteoporosis   . Poliomyelitis   . Vertigo    dizziness    Past Surgical History:  Procedure Laterality Date  . BREAST SURGERY Left    Lumpectomy  . DILATION AND CURETTAGE OF UTERUS     x3  . ganglion cyst r wrist    . LUMBAR LAMINECTOMY    . SPINE SURGERY  11/2010   fusion--[facility]    Family History  Problem Relation Age of Onset  . Hypertension Father   . Kidney disease Father   . Hypertension Sister   . Coronary artery disease Other        P uncles    Social History   Socioeconomic History  . Marital status: Married    Spouse name: Not on file  . Number of children: Not on file  . Years of education: Not on file  . Highest education level: Not on file  Social Needs  . Financial resource strain: Not on file  . Food insecurity - worry: Not on file  . Food insecurity - inability: Not on file  . Transportation needs - medical: Not on file  . Transportation needs - non-medical: Not on file  Occupational History  . 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 Smoking status: Never Smoker  . Smokeless tobacco: Never Used  Substance and Sexual Activity  . Alcohol use: No  . Drug use: No  . Sexual activity: No    Partners: Male  Other Topics Concern  . Not on file  Social  History Narrative   Exercise-- walking 1 x a week    Outpatient Medications Prior to Visit  Medication Sig Dispense Refill  . atenolol (TENORMIN) 50 MG tablet TAKE 1 AND 1/2 TABLETS BY  MOUTH DAILY 135 tablet 1  . azelastine (ASTELIN) 0.1 % nasal spray Place 2 sprays into both nostrils 2 (two) times daily. Use in each nostril as directed 30 mL 12  . azithromycin (ZITHROMAX) 250 MG tablet Take 2 tabs the first day and then 1 tab daily until you run out. 6 tablet 0  . b complex vitamins tablet Take 1 tablet by mouth daily.    . calcium citrate-vitamin D (CITRACAL+D) 315-200 MG-UNIT per tablet Take 1 tablet by mouth 3 (three) times daily.    . cholecalciferol (VITAMIN D) 1000 UNITS tablet Take 1,000 Units by mouth daily.      Marland Kitchen diltiazem (CARTIA XT) 240 MG 24 hr capsule Take 1 capsule (240 mg total) by mouth daily. 30 capsule 0  . fluticasone (FLONASE) 50 MCG/ACT nasal spray Place 2 sprays into both nostrils daily.  16 g 6  . Ginkgo Biloba (GINKGO PO) Take 1 tablet by mouth daily.    Marland Kitchen glucosamine-chondroitin 500-400 MG tablet Take 1 tablet by mouth 3 (three) times daily.    Marland Kitchen guaiFENesin (MUCINEX) 600 MG 12 hr tablet Take 1 tablet (600 mg total) by mouth 2 (two) times daily as needed for cough or to loosen phlegm. 14 tablet 0  . hydrochlorothiazide (HYDRODIURIL) 25 MG tablet Take 1 tablet (25 mg total) by mouth daily. 90 tablet 3  . irbesartan (AVAPRO) 300 MG tablet Take 1 tablet (300 mg total) by mouth daily. 30 tablet 2  . latanoprost (XALATAN) 0.005 % ophthalmic solution Place 1 drop into both eyes at bedtime.    Marland Kitchen levofloxacin (LEVAQUIN) 500 MG tablet Take 1 tablet (500 mg total) by mouth daily. 7 tablet 0  . loratadine (CLARITIN) 10 MG tablet Take 10 mg by mouth daily.      Marland Kitchen LORazepam (ATIVAN) 1 MG tablet TAKE 1 TABLET EVERY 8 HOURS 60 tablet 0  . MAGNESIUM PO Take 500 mg by mouth daily.     . meclizine (ANTIVERT) 25 MG tablet TAKE 1 TABLET BY MOUTH 3 TIMES A DAY AS NEEDED 60 tablet 0  .  meloxicam (MOBIC) 15 MG tablet TAKE 1/2 TO 1 TABLET BY  MOUTH EVERY DAY AS NEEDED 90 tablet 2  . Misc Natural Products (LUTEIN 20 PO) Take 1 capsule by mouth daily.    . multivitamin-lutein (OCUVITE-LUTEIN) CAPS Take 1 capsule by mouth daily.    . ondansetron (ZOFRAN) 4 MG tablet Take 1 tablet (4 mg total) by mouth every 8 (eight) hours as needed for nausea or vomiting. 20 tablet 0  . promethazine-dextromethorphan (PROMETHAZINE-DM) 6.25-15 MG/5ML syrup Take 2.5 mLs by mouth 3 (three) times daily between meals as needed for cough. 118 mL 0   No facility-administered medications prior to visit.     Allergies  Allergen Reactions  . Amoxicillin Diarrhea    "I had diarrhea really bad."  . Hydrocodone   . Plendil [Felodipine]     Caused pains in back and stomach and caused constipation  . Propoxyphene N-Acetaminophen   . Tenex [Guanfacine Hcl]     Made patient feel really bad and was ineffective in decreasing BP    Review of Systems  Constitutional: Negative for chills, fever and malaise/fatigue.  HENT: Negative for congestion and hearing loss.   Eyes: Negative for discharge.  Respiratory: Negative for cough, sputum production and shortness of breath.   Cardiovascular: Negative for chest pain, palpitations and leg swelling.  Gastrointestinal: Negative for abdominal pain, blood in stool, constipation, diarrhea, heartburn, nausea and vomiting.  Genitourinary: Negative for dysuria, frequency, hematuria and urgency.  Musculoskeletal: Negative for back pain, falls and myalgias.  Skin: Negative for rash.  Neurological: Negative for dizziness, sensory change, loss of consciousness, weakness and headaches.  Endo/Heme/Allergies: Negative for environmental allergies. Does not bruise/bleed easily.  Psychiatric/Behavioral: Negative for depression and suicidal ideas. The patient is nervous/anxious. The patient does not have insomnia.        Objective:    Physical Exam  Constitutional: She is  oriented to person, place, and time. She appears well-developed and well-nourished.  HENT:  Head: Normocephalic and atraumatic.  Eyes: Conjunctivae and EOM are normal.  Neck: Normal range of motion. Neck supple. No JVD present. Carotid bruit is not present. No thyromegaly present.  Cardiovascular: Normal rate, regular rhythm and normal heart sounds.  No murmur heard. Pulmonary/Chest: Effort normal and breath sounds normal. No  respiratory distress. She has no wheezes. She has no rales. She exhibits no tenderness.  Musculoskeletal: She exhibits no edema.  Neurological: She is alert and oriented to person, place, and time.  Psychiatric: Her mood appears anxious.  Nursing note and vitals reviewed.   BP (!) 178/76 (BP Location: Left Arm, Patient Position: Sitting, Cuff Size: Normal)   Pulse 61   Temp 98 F (36.7 C) (Oral)   Resp 16   Ht 5' (1.524 m)   Wt 113 lb 6.4 oz (51.4 kg)   SpO2 98%   BMI 22.15 kg/m  Wt Readings from Last 3 Encounters:  07/20/17 113 lb 6.4 oz (51.4 kg)  06/28/17 113 lb 6.4 oz (51.4 kg)  06/16/17 123 lb 2 oz (55.8 kg)   BP Readings from Last 3 Encounters:  07/20/17 (!) 178/76  06/28/17 (!) 170/90  06/16/17 140/72     Immunization History  Administered Date(s) Administered  . Influenza Split 04/27/2011, 03/27/2012  . Influenza Whole 05/03/2007, 03/07/2008, 03/11/2009, 03/16/2010, 04/27/2010  . Influenza, High Dose Seasonal PF 04/09/2013, 03/28/2016, 03/17/2017  . Influenza,inj,Quad PF,6+ Mos 03/20/2014, 03/20/2015  . Pneumococcal Conjugate-13 03/20/2015  . Pneumococcal Polysaccharide-23 03/11/2009    Health Maintenance  Topic Date Due  . TETANUS/TDAP  02/04/1949  . MAMMOGRAM  10/25/2017  . DEXA SCAN  10/26/2018  . INFLUENZA VACCINE  Completed  . PNA vac Low Risk Adult  Completed    Lab Results  Component Value Date   WBC 4.2 12/29/2016   HGB 12.8 12/29/2016   HCT 38.7 12/29/2016   PLT 203.0 12/29/2016   GLUCOSE 87 01/26/2017   CHOL 168  12/29/2016   TRIG 72.0 12/29/2016   HDL 76.90 12/29/2016   LDLCALC 77 12/29/2016   ALT 16 12/29/2016   AST 23 12/29/2016   NA 136 01/26/2017   K 3.7 01/26/2017   CL 100 01/26/2017   CREATININE 0.62 01/26/2017   BUN 11 01/26/2017   CO2 30 01/26/2017   TSH 1.05 03/11/2009   HGBA1C 5.5 08/20/2013   MICROALBUR 0.9 09/02/2014    Lab Results  Component Value Date   TSH 1.05 03/11/2009   Lab Results  Component Value Date   WBC 4.2 12/29/2016   HGB 12.8 12/29/2016   HCT 38.7 12/29/2016   MCV 90.8 12/29/2016   PLT 203.0 12/29/2016   Lab Results  Component Value Date   NA 136 01/26/2017   K 3.7 01/26/2017   CO2 30 01/26/2017   GLUCOSE 87 01/26/2017   BUN 11 01/26/2017   CREATININE 0.62 01/26/2017   BILITOT 0.5 12/29/2016   ALKPHOS 51 12/29/2016   AST 23 12/29/2016   ALT 16 12/29/2016   PROT 6.5 12/29/2016   ALBUMIN 3.9 12/29/2016   CALCIUM 9.3 01/26/2017   GFR 97.24 01/26/2017   Lab Results  Component Value Date   CHOL 168 12/29/2016   Lab Results  Component Value Date   HDL 76.90 12/29/2016   Lab Results  Component Value Date   LDLCALC 77 12/29/2016   Lab Results  Component Value Date   TRIG 72.0 12/29/2016   Lab Results  Component Value Date   CHOLHDL 2 12/29/2016   Lab Results  Component Value Date   HGBA1C 5.5 08/20/2013         Assessment & Plan:   Problem List Items Addressed This Visit      Unprioritized   Anxiety - Primary    Start zoloft rto 2-3 weeks       Relevant Medications  sertraline (ZOLOFT) 50 MG tablet   Essential hypertension    Poorly controlled , encouraged DASH diet, minimize caffeine and obtain adequate sleep. Report concerning symptoms and follow up as directed and as needed Pt has been extremely stressed and bp comes down with ativan rto 2-3 weeks         I am having Kynlie A. Sealey start on sertraline. I am also having her maintain her loratadine, cholecalciferol, MAGNESIUM PO, multivitamin-lutein, calcium  citrate-vitamin D, Ginkgo Biloba (GINKGO PO), glucosamine-chondroitin, Misc Natural Products (LUTEIN 20 PO), b complex vitamins, LORazepam, meloxicam, latanoprost, fluticasone, atenolol, hydrochlorothiazide, irbesartan, guaiFENesin, promethazine-dextromethorphan, azithromycin, ondansetron, meclizine, levofloxacin, azelastine, and diltiazem.  Meds ordered this encounter  Medications  . sertraline (ZOLOFT) 50 MG tablet    Sig: Take 1 tablet (50 mg total) by mouth daily.    Dispense:  30 tablet    Refill:  3    CMA served as scribe during this visit. History, Physical and Plan performed by medical provider. Documentation and orders reviewed and attested to.  [name], DO

## 2017-07-28 DIAGNOSIS — H40013 Open angle with borderline findings, low risk, bilateral: Secondary | ICD-10-CM | POA: Diagnosis not present

## 2017-08-02 ENCOUNTER — Other Ambulatory Visit: Payer: Self-pay | Admitting: Family Medicine

## 2017-08-02 DIAGNOSIS — I10 Essential (primary) hypertension: Secondary | ICD-10-CM

## 2017-08-03 DIAGNOSIS — R040 Epistaxis: Secondary | ICD-10-CM | POA: Insufficient documentation

## 2017-08-03 DIAGNOSIS — H938X1 Other specified disorders of right ear: Secondary | ICD-10-CM | POA: Diagnosis not present

## 2017-08-03 DIAGNOSIS — H938X9 Other specified disorders of ear, unspecified ear: Secondary | ICD-10-CM

## 2017-08-03 HISTORY — DX: Epistaxis: R04.0

## 2017-08-03 HISTORY — DX: Other specified disorders of ear, unspecified ear: H93.8X9

## 2017-08-15 ENCOUNTER — Other Ambulatory Visit: Payer: Self-pay | Admitting: Family Medicine

## 2017-08-15 DIAGNOSIS — I10 Essential (primary) hypertension: Secondary | ICD-10-CM

## 2017-08-16 ENCOUNTER — Telehealth: Payer: Self-pay

## 2017-08-16 ENCOUNTER — Ambulatory Visit (INDEPENDENT_AMBULATORY_CARE_PROVIDER_SITE_OTHER): Payer: Medicare Other | Admitting: Family Medicine

## 2017-08-16 VITALS — BP 181/84 | HR 53

## 2017-08-16 DIAGNOSIS — I10 Essential (primary) hypertension: Secondary | ICD-10-CM | POA: Diagnosis not present

## 2017-08-16 MED ORDER — DILTIAZEM HCL ER COATED BEADS 120 MG PO CP24: 120.0000 mg | ORAL_CAPSULE | Freq: Every day | ORAL | 0 refills | Status: DC

## 2017-08-16 NOTE — Progress Notes (Signed)
Pre visit review using our clinic review tool, if applicable. No additional management support is needed unless otherwise documented below in the visit note.   Patient is currently on Atenolol 50 mg which she takes 1 1/2 daily, Cartia xt 240 mg daily  Irbesartan 300 mg daily.  BP today is  Her cartia was changed from 120 to 240 about a month ago She does not have any sx- CP or SOB Will have her restart the cartia 120 and continue the 240 for a total of 360 mg. Recheck for a BP check on Friday     BP Readings from Last 3 Encounters:  08/16/17 (!) 200/86  07/20/17 (!) 178/76  06/28/17 (!) 170/90   Pulse Readings from Last 3 Encounters:  08/16/17 (!) 53  07/20/17 61  06/28/17 (!) 59    I have reviewed the above documentation and agree, discussed personally with Ms. Macomb ordered this encounter  Medications  . diltiazem (CARDIZEM CD) 120 MG 24 hr capsule    Sig: Take 1 capsule (120 mg total) by mouth daily.    Dispense:  30 capsule    Refill:  0                                                                                         BP Readings from Last 3 Encounters:  07/20/17 (!) 178/76  06/28/17 (!) 170/90  06/16/17 140/72

## 2017-08-16 NOTE — Telephone Encounter (Signed)
FYI Patient was here for a nurse visit BP check and reported she developed intolerance to Sertraline. Medication was removed from her list and added to allergies.

## 2017-08-16 NOTE — Patient Instructions (Signed)
Per Dr. Lorelei Pont, add Cartia 120 mg  And come back Friday 08-18-17 forBP chec,.

## 2017-08-18 ENCOUNTER — Ambulatory Visit (INDEPENDENT_AMBULATORY_CARE_PROVIDER_SITE_OTHER): Payer: Medicare Other | Admitting: Family Medicine

## 2017-08-18 DIAGNOSIS — I10 Essential (primary) hypertension: Secondary | ICD-10-CM | POA: Diagnosis not present

## 2017-08-18 NOTE — Patient Instructions (Signed)
Pt will need to take half tablet of Atenolol in the morning, continue taking cartia one and a half tablets (total 360 mg) lunch time, and at night take the whole atenolol tablet with Irbesartan tablet.   Return in 1-2 weeks for an appt with Dr. Etter Sjogren for follow up unless pulse is still in the 40's.   Per Dr. Etter Sjogren pt should call the office on Monday if pulse is still in the 40's to schedule an appointment to be seen sooner.

## 2017-08-18 NOTE — Progress Notes (Signed)
Pre visit review using our clinic review tool, if applicable. No additional management support is needed unless otherwise documented below in the visit note.  BP Readings from Last 3 Encounters:  08/16/17 (!) 181/84  07/20/17 (!) 178/76  06/28/17 (!) 170/90    Patient here for BP check.   Patient is currently on  Atenolol 50 mg take one and a half tablets daily (total 75 mg), Irbesartan 300 mg tablet take one at night,  Cartia XT 240 mg 24hr capsule take one and a half tablets daily  (total 360 mg) .Pt reports taking cartia around lunch time.  Pt reports compliance with medication. No reported side effects.    BP today is 171/69 and pulse: 51 at 2:53 pm.  Repeat bp is 156/75 and pulse 47 at 3:03 pm.   Per Dr. Etter Sjogren Pt will need to take half tablet of Atenolol in the morning, continue taking cartia one and a half tablets (total 360 mg) lunch time, and at night take the whole atenolol tablet with Irbesartan tablet.   Return in 1-2 weeks for an appt with Dr. Etter Sjogren for follow up unless pulse is still in the 40's.   Per Dr. Etter Sjogren pt should call the office on Monday if pulse is still in the 40's to schedule an appointment to be seen sooner.   Pt would like to wait to schedule the 1-2 week follow up. She will see how her pulse does over the weekend and call back Monday to schedule 1-2 week follow up with Dr. Etter Sjogren.

## 2017-08-21 ENCOUNTER — Ambulatory Visit: Payer: Self-pay

## 2017-08-21 NOTE — Telephone Encounter (Signed)
Pt. Called to report continued slow pulse rate.  Reported she was to call office to sched. appt. within next 1-2 weeks for check up, if pulse remained slow.  Reported pulse of 45-53 beats/min., since Thursday, when intermittently checked.  Had pt. Check pulse during the Triage call; reported 62 and regular during this call.  Denied any dizziness, feeling faint, or shortness of breath.  Stated she has occasional tightness in mid-chest.  Denied radiation of pain into neck, jaw, left shoulder, or (L) arm.  Stated "I have a little tightness right now, just talking about it."  Stated it eased up quickly, when further questioned about it.  Denied nausea or sweating.  Stated she has had slow pulse in the 40's and 50's for a long time; reported she is on Atenolol.  Appt. was given for 08/24/17 at 3:15 PM with Dr. Etter Sjogren.  Pt. also mentioned she has intermittent nosebleeds.  Reported she has 1-2 nosebleeds/day, that last a very short time; "about 30 sec."  Reported she was treated about one mo. Ago with an ENT MD for the nosebleeds; reported he cauterized a blood vessel in the right nostril.  Stated she had a very short episode of feeling light-headed last night when she got up, during a nosebleed.  Encouraged to change position gradually. Denied continued or worsening of symptoms.   Care advice given, per protocol, for slow heart rate and nosebleed. Reminded of appt. on Thurs. With PCP.  Enc. To call back if symptoms worsen.  Verb. Understanding.               Reason for Disposition . [1] Palpitations AND [2] no improvement after following Care Advice . [1] Mild-moderate nosebleed AND [2] bleeding stopped now  Answer Assessment - Initial Assessment Questions 1. DESCRIPTION: "Please describe your heart rate or heart beat that you are having" (e.g., fast/slow, regular/irregular, skipped or extra beats, "palpitations")     Pulse low; 45-53 beats per minute 2. ONSET: "When did it start?" (Minutes, hours or days)   has been in 40's and 50's for a long time 3. DURATION: "How long does it last" (e.g., seconds, minutes, hours)     ongoing 4. PATTERN "Does it come and go, or has it been constant since it started?"  "Does it get worse with exertion?"   "Are you feeling it now?"     Usually runs low 5. TAP: "Using your hand, can you tap out what you are feeling on a chair or table in front of you, so that I can hear?" (Note: not all patients can do this)       Checked pulse rate during call; reported regular 6. HEART RATE: "Can you tell me your heart rate?" "How many beats in 15 seconds?"  (Note: not all patients can do this)       62 at present  7. RECURRENT SYMPTOM: "Have you ever had this before?" If so, ask: "When was the last time?" and "What happened that time?"      Yes 8. CAUSE: "What do you think is causing the palpitations?"     On Atenolol  9. CARDIAC HISTORY: "Do you have any history of heart disease?" (e.g., heart attack, angina, bypass surgery, angioplasty, arrhythmia)      None of the above 10. OTHER SYMPTOMS: "Do you have any other symptoms?" (e.g., dizziness, chest pain, sweating, difficulty breathing)       Denied dizziness or feeling faint; stated her balance is not real good; uses  walker; sometimes "I feel a little heavy in my chest"; stated it happens once in awhile,  denies shortness of breath; denies nausea; denies sweating.     11. PREGNANCY: "Is there any chance you are pregnant?" "When was your last menstrual period?"       N/a  Answer Assessment - Initial Assessment Questions 1. AMOUNT OF BLEEDING: "How bad is the bleeding?" "How much blood was lost?" "Has the bleeding stopped?"   - MILD: needed a couple tissues   - MODERATE: needed many tissues   - SEVERE: large blood clots, soaked many tissues, lasted more than 30 minutes      Occurs 1-2 times/ day; only lasts about 30 seconds 2. ONSET: "When did the nosebleed start?"      About 5-6 weeks ; it started after using Flonase 3.  FREQUENCY: "How many nosebleeds have you had in the last 24 hours?"      1-2 times/ day 4. RECURRENT SYMPTOMS: "Have there been other recent nosebleeds?" If so, ask: "How long did it take you to stop the bleeding?" "What worked best?"      recently 5. CAUSE: "What do you think caused this nosebleed?"     Saw ENT MD about one month ago and he cauterized the right nostril 6. LOCAL FACTORS: "Do you have any cold symptoms?", "Have you been rubbing or picking at your nose?"      7. SYSTEMIC FACTORS: "Do you have high blood pressure or any bleeding problems?"     *No Answer* 8. BLOOD THINNERS: "Do you take any blood thinners?" (e.g., coumadin, heparin, aspirin, Plavix)    No  9. OTHER SYMPTOMS: "Do you have any other symptoms?" (e.g., lightheadedness)     Had one episode last night with feeling lightheaded 10. PREGNANCY: "Is there any chance you are pregnant?" "When was your last menstrual period?"       n/a  Protocols used: HEART RATE AND HEARTBEAT [place], [place]

## 2017-08-24 ENCOUNTER — Encounter: Payer: Self-pay | Admitting: Family Medicine

## 2017-08-24 ENCOUNTER — Ambulatory Visit: Payer: Medicare Other | Admitting: Family Medicine

## 2017-08-24 DIAGNOSIS — I10 Essential (primary) hypertension: Secondary | ICD-10-CM | POA: Diagnosis not present

## 2017-08-24 MED ORDER — ATENOLOL 50 MG PO TABS: ORAL_TABLET | ORAL | 1 refills | Status: DC

## 2017-08-24 MED ORDER — SPIRONOLACTONE 25 MG PO TABS: 25.0000 mg | ORAL_TABLET | Freq: Every day | ORAL | 2 refills | Status: DC

## 2017-08-24 NOTE — Progress Notes (Signed)
Patient ID: Cassidy Brown, female    DOB: 06-Jun-1930  Age: 84 y.o. MRN: [id number]    Subjective:  Subjective  HPI [name] presents for bp running 150-160/ 70-80 and pulse in 40s    She denies sob, dizziness  Or cp and no headaches.  Review of Systems  Constitutional: Negative for appetite change, diaphoresis, fatigue and unexpected weight change.  Eyes: Negative for pain, redness and visual disturbance.  Respiratory: Negative for cough, chest tightness, shortness of breath and wheezing.   Cardiovascular: Negative for chest pain, palpitations and leg swelling.  Endocrine: Negative for cold intolerance, heat intolerance, polydipsia, polyphagia and polyuria.  Genitourinary: Negative for difficulty urinating, dysuria and frequency.  Neurological: Negative for dizziness, light-headedness, numbness and headaches.    History Past Medical History:  Diagnosis Date  . Allergy   . Cancer ([place])    breast,hx of  . Diverticulosis   . Glaucoma   . Hypertension   . Osteoporosis   . Poliomyelitis   . Vertigo    dizziness    She has a past surgical history that includes Breast surgery (Left); Lumbar laminectomy; Dilation and curettage of uterus; ganglion cyst r wrist; and Spine surgery (11/2010).   Her family history includes Coronary artery disease in her other; Hypertension in her father and sister; Kidney disease in her father.She reports that  has never smoked. she has never used smokeless tobacco. She reports that she does not drink alcohol or use drugs.  Current Outpatient Medications on File Prior to Visit  Medication Sig Dispense Refill  . azelastine (ASTELIN) 0.1 % nasal spray Place 2 sprays into both nostrils 2 (two) times daily. Use in each nostril as directed 30 mL 12  . b complex vitamins tablet Take 1 tablet by mouth daily.    . calcium citrate-vitamin D (CITRACAL+D) 315-200 MG-UNIT per tablet Take 1 tablet by mouth 3 (three) times daily.    Marland Kitchen CARTIA XT 240 MG 24 hr capsule  TAKE 1 CAPSULE(240 MG) BY MOUTH DAILY (Patient taking differently: TAKE 1.5 CAPSULES (TOTAL 360 MG) BY MOUTH DAILY) 30 capsule 0  . cholecalciferol (VITAMIN D) 1000 UNITS tablet Take 1,000 Units by mouth daily.      Marland Kitchen diltiazem (CARDIZEM CD) 120 MG 24 hr capsule Take 1 capsule (120 mg total) by mouth daily. 30 capsule 0  . fluticasone (FLONASE) 50 MCG/ACT nasal spray Place 2 sprays into both nostrils daily. 16 g 6  . Ginkgo Biloba (GINKGO PO) Take 1 tablet by mouth daily.    Marland Kitchen glucosamine-chondroitin 500-400 MG tablet Take 1 tablet by mouth 3 (three) times daily.    Marland Kitchen guaiFENesin (MUCINEX) 600 MG 12 hr tablet Take 1 tablet (600 mg total) by mouth 2 (two) times daily as needed for cough or to loosen phlegm. 14 tablet 0  . irbesartan (AVAPRO) 300 MG tablet TAKE 1 TABLET(300 MG) BY MOUTH DAILY (Patient taking differently: TAKE 1 TABLET(300 MG) BY MOUTH DAILY AT NIGHT) 90 tablet 0  . latanoprost (XALATAN) 0.005 % ophthalmic solution Place 1 drop into both eyes at bedtime.    Marland Kitchen levofloxacin (LEVAQUIN) 500 MG tablet Take 1 tablet (500 mg total) by mouth daily. 7 tablet 0  . loratadine (CLARITIN) 10 MG tablet Take 10 mg by mouth daily.      Marland Kitchen LORazepam (ATIVAN) 1 MG tablet TAKE 1 TABLET EVERY 8 HOURS 60 tablet 0  . MAGNESIUM PO Take 500 mg by mouth daily.     . meclizine (ANTIVERT) 25  MG tablet TAKE 1 TABLET BY MOUTH 3 TIMES A DAY AS NEEDED 60 tablet 0  . meloxicam (MOBIC) 15 MG tablet TAKE 1/2 TO 1 TABLET BY  MOUTH EVERY DAY AS NEEDED 90 tablet 2  . Misc Natural Products (LUTEIN 20 PO) Take 1 capsule by mouth daily.    . multivitamin-lutein (OCUVITE-LUTEIN) CAPS Take 1 capsule by mouth daily.    . ondansetron (ZOFRAN) 4 MG tablet Take 1 tablet (4 mg total) by mouth every 8 (eight) hours as needed for nausea or vomiting. 20 tablet 0  . promethazine-dextromethorphan (PROMETHAZINE-DM) 6.25-15 MG/5ML syrup Take 2.5 mLs by mouth 3 (three) times daily between meals as needed for cough. 118 mL 0   No current  facility-administered medications on file prior to visit.      Objective:  Objective  Physical Exam  Constitutional: She is oriented to person, place, and time. She appears well-developed and well-nourished.  HENT:  Head: Normocephalic and atraumatic.  Eyes: Conjunctivae and EOM are normal.  Neck: Normal range of motion. Neck supple. No JVD present. Carotid bruit is not present. No thyromegaly present.  Cardiovascular: Normal rate, regular rhythm and normal heart sounds.  No murmur heard. Pulmonary/Chest: Effort normal and breath sounds normal. No respiratory distress. She has no wheezes. She has no rales. She exhibits no tenderness.  Musculoskeletal: She exhibits no edema.  Neurological: She is alert and oriented to person, place, and time.  Psychiatric: She has a normal mood and affect.   BP (!) 191/79   Pulse 60   Resp 16   Ht 5' (1.524 m)   Wt 115 lb 9.6 oz (52.4 kg)   SpO2 100%   BMI 22.58 kg/m  Wt Readings from Last 3 Encounters:  08/24/17 115 lb 9.6 oz (52.4 kg)  07/20/17 113 lb 6.4 oz (51.4 kg)  06/28/17 113 lb 6.4 oz (51.4 kg)     Lab Results  Component Value Date   WBC 4.2 12/29/2016   HGB 12.8 12/29/2016   HCT 38.7 12/29/2016   PLT 203.0 12/29/2016   GLUCOSE 87 01/26/2017   CHOL 168 12/29/2016   TRIG 72.0 12/29/2016   HDL 76.90 12/29/2016   LDLCALC 77 12/29/2016   ALT 16 12/29/2016   AST 23 12/29/2016   NA 136 01/26/2017   K 3.7 01/26/2017   CL 100 01/26/2017   CREATININE 0.62 01/26/2017   BUN 11 01/26/2017   CO2 30 01/26/2017   TSH 1.05 03/11/2009   HGBA1C 5.5 08/20/2013   MICROALBUR 0.9 09/02/2014    US Pelvis Transvanginal Non-ob (tv Only)  Result Date: 04/27/2017 CLINICAL DATA:  Thickened endometrium seen on recent renal ultrasound. EXAM: TRANSABDOMINAL AND TRANSVAGINAL ULTRASOUND OF PELVIS TECHNIQUE: Both transabdominal and transvaginal ultrasound examinations of the pelvis were performed. Transabdominal technique was performed for global  imaging of the pelvis including uterus, ovaries, adnexal regions, and pelvic cul-de-sac. It was necessary to proceed with endovaginal exam following the transabdominal exam to visualize the endometrium and ovaries. COMPARISON:  None FINDINGS: Uterus Measurements: 7 x 3.3 x 4.2 cm. No focal uterine mass. Endometrium Thickness: 2.2 cm. Thickened heterogeneous endometrium without significant vascularity. Right ovary Measurements: 2.2 x 1.5 x 1.9 cm. Complex cystic lesion in the right ovary measuring up to 12 mm with a calcified rim. Left ovary Measurements: 2.0 x 1.0 x 1.7 cm. Normal appearance/no adnexal mass. Other findings No abnormal free fluid. IMPRESSION: 1. The endometrium is abnormal. It is thickened to 2.2 cm and heterogeneous. Endometrial neoplasm/malignancy should be considered.  Recommend gynecologic consultation and endometrial biopsy. 2. 12 mm complex cyst with a calcified rim in the right ovary. This is nonspecific. This could be further assessed with a pelvic MRI. If MRI is not pursued, close ultrasound follow-up is recommended. These results will be called to the ordering clinician or representative by the Radiologist Assistant, and communication documented in the PACS or zVision Dashboard. Electronically Signed   By: Dorise Bullion III M.D   On: 04/27/2017 15:43   US Pelvis (transabdominal Only)  Result Date: 04/27/2017 CLINICAL DATA:  Thickened endometrium seen on recent renal ultrasound. EXAM: TRANSABDOMINAL AND TRANSVAGINAL ULTRASOUND OF PELVIS TECHNIQUE: Both transabdominal and transvaginal ultrasound examinations of the pelvis were performed. Transabdominal technique was performed for global imaging of the pelvis including uterus, ovaries, adnexal regions, and pelvic cul-de-sac. It was necessary to proceed with endovaginal exam following the transabdominal exam to visualize the endometrium and ovaries. COMPARISON:  None FINDINGS: Uterus Measurements: 7 x 3.3 x 4.2 cm. No focal uterine mass.  Endometrium Thickness: 2.2 cm. Thickened heterogeneous endometrium without significant vascularity. Right ovary Measurements: 2.2 x 1.5 x 1.9 cm. Complex cystic lesion in the right ovary measuring up to 12 mm with a calcified rim. Left ovary Measurements: 2.0 x 1.0 x 1.7 cm. Normal appearance/no adnexal mass. Other findings No abnormal free fluid. IMPRESSION: 1. The endometrium is abnormal. It is thickened to 2.2 cm and heterogeneous. Endometrial neoplasm/malignancy should be considered. Recommend gynecologic consultation and endometrial biopsy. 2. 12 mm complex cyst with a calcified rim in the right ovary. This is nonspecific. This could be further assessed with a pelvic MRI. If MRI is not pursued, close ultrasound follow-up is recommended. These results will be called to the ordering clinician or representative by the Radiologist Assistant, and communication documented in the PACS or zVision Dashboard. Electronically Signed   By: Dorise Bullion III M.D   On: 04/27/2017 15:43     Assessment & Plan:  Plan  I have discontinued Miles A. Caesar's azithromycin. I have also changed her atenolol. Additionally, I am having her start on spironolactone. Lastly, I am having her maintain her loratadine, cholecalciferol, MAGNESIUM PO, multivitamin-lutein, calcium citrate-vitamin D, Ginkgo Biloba (GINKGO PO), glucosamine-chondroitin, Misc Natural Products (LUTEIN 20 PO), b complex vitamins, LORazepam, meloxicam, latanoprost, fluticasone, guaiFENesin, promethazine-dextromethorphan, ondansetron, meclizine, levofloxacin, azelastine, CARTIA XT, irbesartan, and diltiazem.  Meds ordered this encounter  Medications  . atenolol (TENORMIN) 50 MG tablet    Sig: 1 po qd    Dispense:  90 tablet    Refill:  1  . spironolactone (ALDACTONE) 25 MG tablet    Sig: Take 1 tablet (25 mg total) by mouth daily.    Dispense:  30 tablet    Refill:  2    Problem List Items Addressed This Visit      Unprioritized   Essential  hypertension    Dec atenolol to 50 mg daily Add spironolactone  Cont' cartia and avapro F/u 2-3 weeks       Relevant Medications   atenolol (TENORMIN) 50 MG tablet   spironolactone (ALDACTONE) 25 MG tablet      Follow-up: Return in about 2 weeks (around 09/07/2017) for hypertension.  [name], DO

## 2017-08-24 NOTE — Assessment & Plan Note (Signed)
Dec atenolol to 50 mg daily Add spironolactone  Cont' cartia and avapro F/u 2-3 weeks

## 2017-08-24 NOTE — Patient Instructions (Signed)
DASH Eating Plan DASH stands for "Dietary Approaches to Stop Hypertension." The DASH eating plan is a healthy eating plan that has been shown to reduce high blood pressure (hypertension). It may also reduce your risk for type 2 diabetes, heart disease, and stroke. The DASH eating plan may also help with weight loss. What are tips for following this plan? General guidelines  Avoid eating more than 2,300 mg (milligrams) of salt (sodium) a day. If you have hypertension, you may need to reduce your sodium intake to 1,500 mg a day.  Limit alcohol intake to no more than 1 drink a day for nonpregnant women and 2 drinks a day for men. One drink equals 12 oz of beer, 5 oz of wine, or 1 oz of hard liquor.  Work with your health care provider to maintain a healthy body weight or to lose weight. Ask what an ideal weight is for you.  Get at least 30 minutes of exercise that causes your heart to beat faster (aerobic exercise) most days of the week. Activities may include walking, swimming, or biking.  Work with your health care provider or diet and nutrition specialist (dietitian) to adjust your eating plan to your individual calorie needs. Reading food labels  Check food labels for the amount of sodium per serving. Choose foods with less than 5 percent of the Daily Value of sodium. Generally, foods with less than 300 mg of sodium per serving fit into this eating plan.  To find whole grains, look for the word "whole" as the first word in the ingredient list. Shopping  Buy products labeled as "low-sodium" or "no salt added."  Buy fresh foods. Avoid canned foods and premade or frozen meals. Cooking  Avoid adding salt when cooking. Use salt-free seasonings or herbs instead of table salt or sea salt. Check with your health care provider or pharmacist before using salt substitutes.  Do not fry foods. Cook foods using healthy methods such as baking, boiling, grilling, and broiling instead.  Cook with  heart-healthy oils, such as olive, canola, soybean, or sunflower oil. Meal planning   Eat a balanced diet that includes: ? 5 or more servings of fruits and vegetables each day. At each meal, try to fill half of your plate with fruits and vegetables. ? Up to 6-8 servings of whole grains each day. ? Less than 6 oz of lean meat, poultry, or fish each day. A 3-oz serving of meat is about the same size as a deck of cards. One egg equals 1 oz. ? 2 servings of low-fat dairy each day. ? A serving of nuts, seeds, or beans 5 times each week. ? Heart-healthy fats. Healthy fats called Omega-3 fatty acids are found in foods such as flaxseeds and coldwater fish, like sardines, salmon, and mackerel.  Limit how much you eat of the following: ? Canned or prepackaged foods. ? Food that is high in trans fat, such as fried foods. ? Food that is high in saturated fat, such as fatty meat. ? Sweets, desserts, sugary drinks, and other foods with added sugar. ? Full-fat dairy products.  Do not salt foods before eating.  Try to eat at least 2 vegetarian meals each week.  Eat more home-cooked food and less restaurant, buffet, and fast food.  When eating at a restaurant, ask that your food be prepared with less salt or no salt, if possible. What foods are recommended? The items listed may not be a complete list. Talk with your dietitian about what   dietary choices are best for you. Grains Whole-grain or whole-wheat bread. Whole-grain or whole-wheat pasta. Brown rice. Oatmeal. Quinoa. Bulgur. Whole-grain and low-sodium cereals. Pita bread. Low-fat, low-sodium crackers. Whole-wheat flour tortillas. Vegetables Fresh or frozen vegetables (raw, steamed, roasted, or grilled). Low-sodium or reduced-sodium tomato and vegetable juice. Low-sodium or reduced-sodium tomato sauce and tomato paste. Low-sodium or reduced-sodium canned vegetables. Fruits All fresh, dried, or frozen fruit. Canned fruit in natural juice (without  added sugar). Meat and other protein foods Skinless chicken or turkey. Ground chicken or turkey. Pork with fat trimmed off. Fish and seafood. Egg whites. Dried beans, peas, or lentils. Unsalted nuts, nut butters, and seeds. Unsalted canned beans. Lean cuts of beef with fat trimmed off. Low-sodium, lean deli meat. Dairy Low-fat (1%) or fat-free (skim) milk. Fat-free, low-fat, or reduced-fat cheeses. Nonfat, low-sodium ricotta or cottage cheese. Low-fat or nonfat yogurt. Low-fat, low-sodium cheese. Fats and oils Soft margarine without trans fats. Vegetable oil. Low-fat, reduced-fat, or light mayonnaise and salad dressings (reduced-sodium). Canola, safflower, olive, soybean, and sunflower oils. Avocado. Seasoning and other foods Herbs. Spices. Seasoning mixes without salt. Unsalted popcorn and pretzels. Fat-free sweets. What foods are not recommended? The items listed may not be a complete list. Talk with your dietitian about what dietary choices are best for you. Grains Baked goods made with fat, such as croissants, muffins, or some breads. Dry pasta or rice meal packs. Vegetables Creamed or fried vegetables. Vegetables in a cheese sauce. Regular canned vegetables (not low-sodium or reduced-sodium). Regular canned tomato sauce and paste (not low-sodium or reduced-sodium). Regular tomato and vegetable juice (not low-sodium or reduced-sodium). Pickles. Olives. Fruits Canned fruit in a light or heavy syrup. Fried fruit. Fruit in cream or butter sauce. Meat and other protein foods Fatty cuts of meat. Ribs. Fried meat. Bacon. Sausage. Bologna and other processed lunch meats. Salami. Fatback. Hotdogs. Bratwurst. Salted nuts and seeds. Canned beans with added salt. Canned or smoked fish. Whole eggs or egg yolks. Chicken or turkey with skin. Dairy Whole or 2% milk, cream, and half-and-half. Whole or full-fat cream cheese. Whole-fat or sweetened yogurt. Full-fat cheese. Nondairy creamers. Whipped toppings.  Processed cheese and cheese spreads. Fats and oils Butter. Stick margarine. Lard. Shortening. Ghee. Bacon fat. Tropical oils, such as coconut, palm kernel, or palm oil. Seasoning and other foods Salted popcorn and pretzels. Onion salt, garlic salt, seasoned salt, table salt, and sea salt. Worcestershire sauce. Tartar sauce. Barbecue sauce. Teriyaki sauce. Soy sauce, including reduced-sodium. Steak sauce. Canned and packaged gravies. Fish sauce. Oyster sauce. Cocktail sauce. Horseradish that you find on the shelf. Ketchup. Mustard. Meat flavorings and tenderizers. Bouillon cubes. Hot sauce and Tabasco sauce. Premade or packaged marinades. Premade or packaged taco seasonings. Relishes. Regular salad dressings. Where to find more information:  National Heart, Lung, and Blood Institute: www.nhlbi.nih.gov  American Heart Association: www.heart.org Summary  The DASH eating plan is a healthy eating plan that has been shown to reduce high blood pressure (hypertension). It may also reduce your risk for type 2 diabetes, heart disease, and stroke.  With the DASH eating plan, you should limit salt (sodium) intake to 2,300 mg a day. If you have hypertension, you may need to reduce your sodium intake to 1,500 mg a day.  When on the DASH eating plan, aim to eat more fresh fruits and vegetables, whole grains, lean proteins, low-fat dairy, and heart-healthy fats.  Work with your health care provider or diet and nutrition specialist (dietitian) to adjust your eating plan to your individual   calorie needs. This information is not intended to replace advice given to you by your health care provider. Make sure you discuss any questions you have with your health care provider. Document Released: 05/19/2011 Document Revised: 05/23/2016 Document Reviewed: 05/23/2016 [employer] Interactive Patient Education  2018 [employer]  

## 2017-08-29 ENCOUNTER — Encounter (HOSPITAL_BASED_OUTPATIENT_CLINIC_OR_DEPARTMENT_OTHER): Payer: Self-pay

## 2017-08-29 ENCOUNTER — Emergency Department (HOSPITAL_BASED_OUTPATIENT_CLINIC_OR_DEPARTMENT_OTHER)
Admission: EM | Admit: 2017-08-29 | Discharge: 2017-08-29 | Disposition: A | Discharge status: Home / Self Care | Payer: Medicare Other | Attending: Emergency Medicine | Admitting: Emergency Medicine

## 2017-08-29 ENCOUNTER — Other Ambulatory Visit: Payer: Self-pay

## 2017-08-29 DIAGNOSIS — Z853 Personal history of malignant neoplasm of breast: Secondary | ICD-10-CM | POA: Insufficient documentation

## 2017-08-29 DIAGNOSIS — R42 Dizziness and giddiness: Secondary | ICD-10-CM | POA: Diagnosis not present

## 2017-08-29 DIAGNOSIS — R51 Headache: Secondary | ICD-10-CM | POA: Diagnosis not present

## 2017-08-29 DIAGNOSIS — Z79899 Other long term (current) drug therapy: Secondary | ICD-10-CM | POA: Insufficient documentation

## 2017-08-29 DIAGNOSIS — I159 Secondary hypertension, unspecified: Secondary | ICD-10-CM | POA: Diagnosis not present

## 2017-08-29 DIAGNOSIS — I10 Essential (primary) hypertension: Secondary | ICD-10-CM | POA: Diagnosis present

## 2017-08-29 LAB — BASIC METABOLIC PANEL
Anion gap: 8 (ref 5–15)
BUN: 17 mg/dL (ref 6–20)
CHLORIDE: 98 mmol/L — AB (ref 101–111)
CO2: 25 mmol/L (ref 22–32)
CREATININE: 0.56 mg/dL (ref 0.44–1.00)
Calcium: 9.5 mg/dL (ref 8.9–10.3)
GFR calc Af Amer: >60 mL/min (ref >60)
Glucose, Bld: 105 mg/dL — ABNORMAL HIGH (ref 65–99)
Potassium: 4.2 mmol/L (ref 3.5–5.1)
SODIUM: 131 mmol/L — AB (ref 135–145)

## 2017-08-29 MED ORDER — ACETAMINOPHEN 500 MG PO TABS: 1000.0000 mg | ORAL_TABLET | Freq: Once | ORAL | Status: DC

## 2017-08-29 NOTE — Progress Notes (Addendum)
[facility] [place], [place], [place] [address] [phone]  Date:  08/30/2017   Name:  Cassidy Brown   DOB:  Oct 31, 1929   MRN:  [id number]  PCP:  [name], DO    Chief Complaint: Follow-up (Pt here for bp f/u.)   History of Present Illness:  Cassidy Brown is a 84 y.o. very pleasant female patient who presents with the following:  Pt of Dr. Etter Sjogren here today for a BP follow-up visit She last saw Dr. Etter Sjogren on 3/14:  Cassidy Brown presents for bp running 150-160/ 70-80 and pulse in 40s    She denies sob, dizziness  Or cp and no headaches.  Dec atenolol to 50 mg daily Add spironolactone  Cont' cartia and avapro F/u 2-3 weeks   She then was in the ER yesterday with dizziness and high BP.  They gave her some meclizine but otherwise I don't think they made any changes to her regimen  Her BP this am was 140/? Yesterday she felt dizzy which she attributes to vertigo- now resolved She is currently feeling ok, but is nervous that her BP may go up again She is doing ok with the spiro except she does have some urinary urgency and sometimes feels as though she will not make it to the bathroom.  Advised her to try urinating on a schedule so her bladder does not ever get too full- she will try it   Pulse Readings from Last 3 Encounters:  08/30/17 (!) 55  08/29/17 60  08/24/17 60    BP Readings from Last 3 Encounters:  08/30/17 140/72  08/29/17 (!) 158/83  08/24/17 (!) 191/79    Patient Active Problem List   Diagnosis Date Noted  . Anxiety 07/20/2017  . Essential hypertension 04/02/2017  . Pansinusitis 04/02/2017  . BACK PAIN, CHRONIC 06/15/2010  . BREAST CANCER, HX OF 03/07/2008  . SCOLIOSIS 11/20/2007  . POLIOMYELITIS, HX OF 11/20/2007  . HYPERTENSION 10/13/2006  . DIVERTICULOSIS, COLON 10/13/2006  . OSTEOPOROSIS 10/13/2006  . DIZZINESS OR VERTIGO 10/13/2006    Past Medical History:   Diagnosis Date  . Allergy   . Cancer ([place])    breast,hx of  . Diverticulosis   . Glaucoma   . Hypertension   . Osteoporosis   . Poliomyelitis   . Vertigo    dizziness    Past Surgical History:  Procedure Laterality Date  . BREAST SURGERY Left    Lumpectomy  . DILATION AND CURETTAGE OF UTERUS     x3  . ganglion cyst r wrist    . LUMBAR LAMINECTOMY    . SPINE SURGERY  11/2010   fusion--jenkins    Social History   Tobacco Use  . Smoking status: Never Smoker  . Smokeless tobacco: Never Used  Substance Use Topics  . Alcohol use: No  . Drug use: No    Family History  Problem Relation Age of Onset  . Hypertension Father   . Kidney disease Father   . Hypertension Sister   . Coronary artery disease Other        P uncles    Allergies  Allergen Reactions  . Amoxicillin Diarrhea    "I had diarrhea really bad."  . Hydrocodone   . Plendil [Felodipine]     Caused pains in back and stomach and caused constipation  . Propoxyphene N-Acetaminophen   . Sertraline Hcl Other (See Comments)  Per patient shaking, Headache and weird feeling in her eyes.  Denzil Hughes Hcl]     Made patient feel really bad and was ineffective in decreasing BP    Medication list has been reviewed and updated.  Current Outpatient Medications on File Prior to Visit  Medication Sig Dispense Refill  . atenolol (TENORMIN) 50 MG tablet 1 po qd 90 tablet 1  . azelastine (ASTELIN) 0.1 % nasal spray Place 2 sprays into both nostrils 2 (two) times daily. Use in each nostril as directed 30 mL 12  . b complex vitamins tablet Take 1 tablet by mouth daily.    . calcium citrate-vitamin D (CITRACAL+D) 315-200 MG-UNIT per tablet Take 1 tablet by mouth 3 (three) times daily.    Marland Kitchen CARTIA XT 240 MG 24 hr capsule TAKE 1 CAPSULE(240 MG) BY MOUTH DAILY (Patient taking differently: TAKE 1.5 CAPSULES (TOTAL 360 MG) BY MOUTH DAILY) 30 capsule 0  . cholecalciferol (VITAMIN D) 1000 UNITS tablet Take 1,000 Units  by mouth daily.      Marland Kitchen diltiazem (CARDIZEM CD) 120 MG 24 hr capsule Take 1 capsule (120 mg total) by mouth daily. 30 capsule 0  . fluticasone (FLONASE) 50 MCG/ACT nasal spray Place 2 sprays into both nostrils daily. 16 g 6  . Ginkgo Biloba (GINKGO PO) Take 1 tablet by mouth daily.    Marland Kitchen glucosamine-chondroitin 500-400 MG tablet Take 1 tablet by mouth 3 (three) times daily.    Marland Kitchen guaiFENesin (MUCINEX) 600 MG 12 hr tablet Take 1 tablet (600 mg total) by mouth 2 (two) times daily as needed for cough or to loosen phlegm. 14 tablet 0  . irbesartan (AVAPRO) 300 MG tablet TAKE 1 TABLET(300 MG) BY MOUTH DAILY (Patient taking differently: TAKE 1 TABLET(300 MG) BY MOUTH DAILY AT NIGHT) 90 tablet 0  . latanoprost (XALATAN) 0.005 % ophthalmic solution Place 1 drop into both eyes at bedtime.    Marland Kitchen loratadine (CLARITIN) 10 MG tablet Take 10 mg by mouth daily.      Marland Kitchen LORazepam (ATIVAN) 1 MG tablet TAKE 1 TABLET EVERY 8 HOURS 60 tablet 0  . MAGNESIUM PO Take 500 mg by mouth daily.     . meclizine (ANTIVERT) 25 MG tablet TAKE 1 TABLET BY MOUTH 3 TIMES A DAY AS NEEDED 60 tablet 0  . meloxicam (MOBIC) 15 MG tablet TAKE 1/2 TO 1 TABLET BY  MOUTH EVERY DAY AS NEEDED 90 tablet 2  . Misc Natural Products (LUTEIN 20 PO) Take 1 capsule by mouth daily.    . multivitamin-lutein (OCUVITE-LUTEIN) CAPS Take 1 capsule by mouth daily.    Marland Kitchen spironolactone (ALDACTONE) 25 MG tablet Take 1 tablet (25 mg total) by mouth daily. 30 tablet 2   No current facility-administered medications on file prior to visit.     Review of Systems:  As per HPI- otherwise negative.   Physical Examination: Vitals:   08/30/17 1243  BP: 140/72  Pulse: (!) 55  Temp: 97.8 F (36.6 C)  SpO2: 98%   Vitals:   08/30/17 1243  Weight: 115 lb (52.2 kg)  Height: 5' (1.524 m)   Body mass index is 22.46 kg/m. Ideal Body Weight: Weight in (lb) to have BMI = 25: 127.7  GEN: WDWN, NAD, Non-toxic, A & O x 3, petite build, kyphosis of age  HEENT:  Atraumatic, Normocephalic. Neck supple. No masses, No LAD. Ears and Nose: No external deformity. CV: RRR, No M/G/R. No JVD. No thrill. No extra heart sounds. PULM: CTA B,  no wheezes, crackles, rhonchi. No retractions. No resp. distress. No accessory muscle use. ABD: S, NT, ND, +BS. No rebound. No HSM. EXTR: No c/c/e NEURO uses a walker PSYCH: Normally interactive. Conversant. Not depressed or anxious appearing.  Calm demeanor.    Assessment and Plan: Essential hypertension - Plan: CBC, CANCELED: Basic metabolic panel  Hyponatremia - Plan: Comprehensive metabolic panel, CANCELED: Basic metabolic panel  Labile blood pressure - Plan: TSH  Medication monitoring encounter - Plan: Comprehensive metabolic panel, CBC  Following up on her labile BP today At the moment all looks ok- encouraged her that her BP may be under control at last She will continue to monitor but not too frequently as this causes her anxiety We will get some follow-up labs today- recheck for low na noted yesterday Will plan further follow- up pending labs.   Signed Lamar Blinks, MD  Received her labs 3/22- message to pt:  Your blood counts and thyroid level are normal Electrolyte levels are normal although potassium is at the upper limit of normal. Your blood sugar is a bit high, but assuming you were NOT fasting at our visit it is ok  I do want to keep an eye on your potassium as you are on the spironolactone.  Please come by for a potassium level (lab visit only) in 7-10 days so we can monitor this.  Please schedule to see Dr. Etter Sjogren in about a month to follow-up on your blood pressure Results for orders placed or performed in visit on 08/30/17  TSH  Result Value Ref Range   TSH 1.21 0.35 - 4.50 uIU/mL  Comprehensive metabolic panel  Result Value Ref Range   Sodium 135 135 - 145 mEq/L   Potassium 5.1 3.5 - 5.1 mEq/L   Chloride 97 96 - 112 mEq/L   CO2 29 19 - 32 mEq/L   Glucose, Bld 149 (H) 70 - 99 mg/dL    BUN 19 6 - 23 mg/dL   Creatinine, Ser 0.77 0.40 - 1.20 mg/dL   Total Bilirubin 0.3 0.2 - 1.2 mg/dL   Alkaline Phosphatase 68 39 - 117 U/L   AST 20 0 - 37 U/L   ALT 14 0 - 35 U/L   Total Protein 6.6 6.0 - 8.3 g/dL   Albumin 4.2 3.5 - 5.2 g/dL   Calcium 10.2 8.4 - 10.5 mg/dL   GFR 75.62 >60.00 mL/min  CBC  Result Value Ref Range   WBC 4.9 4.0 - 10.5 K/uL   RBC 4.40 3.87 - 5.11 Mil/uL   Platelets 265.0 150.0 - 400.0 K/uL   Hemoglobin 13.4 12.0 - 15.0 g/dL   HCT 39.3 36.0 - 46.0 %   MCV 89.3 78.0 - 100.0 fl   MCHC 34.1 30.0 - 36.0 g/dL   RDW 14.5 11.5 - 15.5 %

## 2017-08-29 NOTE — ED Notes (Signed)
ED Provider at bedside. 

## 2017-08-29 NOTE — ED Triage Notes (Addendum)
Pt c/o elevated BP since July 2018 with PCP making BP med changes-reports elevated x 2 days-states she had a HA earlier but denies at present-pt NAD-steady gait with own walker

## 2017-08-29 NOTE — ED Provider Notes (Signed)
[facility] EMERGENCY DEPARTMENT Provider Note  CSN: [id number] Arrival date & time: 08/29/17 1203  Chief Complaint(s) Hypertension  HPI Cassidy Brown is a 84 y.o. female history of hypertension, peripheral vertigo who presents to the emergency department with elevated blood pressures with systolic BPs in the 938B.  Patient reports that they have been having difficulty controlling her blood pressure over the past several months.  She reports that she recently had medication changes about 1 week ago and is now taken for different high blood pressure medications.   she is endorsing mild headache and typical vertigo, which improved after meclizine.  Denies any focal deficits, or visual disturbances.  Denies any chest pain or shortness of breath.  Denies any lower extremity edema.  No difficulty voiding.  Denies any other physical complaints  HPI  Past Medical History Past Medical History:  Diagnosis Date  . Allergy   . Cancer ([place])    breast,hx of  . Diverticulosis   . Glaucoma   . Hypertension   . Osteoporosis   . Poliomyelitis   . Vertigo    dizziness   Patient Active Problem List   Diagnosis Date Noted  . Anxiety 07/20/2017  . Essential hypertension 04/02/2017  . Pansinusitis 04/02/2017  . BACK PAIN, CHRONIC 06/15/2010  . BREAST CANCER, HX OF 03/07/2008  . SCOLIOSIS 11/20/2007  . POLIOMYELITIS, HX OF 11/20/2007  . HYPERTENSION 10/13/2006  . DIVERTICULOSIS, COLON 10/13/2006  . OSTEOPOROSIS 10/13/2006  . DIZZINESS OR VERTIGO 10/13/2006   Home Medication(s) Prior to Admission medications   Medication Sig Start Date End Date Taking? Authorizing Provider  atenolol (TENORMIN) 50 MG tablet 1 po qd 08/24/17   Carollee Herter, Alferd Apa, DO  azelastine (ASTELIN) 0.1 % nasal spray Place 2 sprays into both nostrils 2 (two) times daily. Use in each nostril as directed 06/28/17   Saguier, Percell Miller, PA-C  b complex vitamins tablet Take 1 tablet by mouth daily.    [provider]  calcium citrate-vitamin D (CITRACAL+D) 315-200 MG-UNIT per tablet Take 1 tablet by mouth 3 (three) times daily.    [provider]  CARTIA XT 240 MG 24 hr capsule TAKE 1 CAPSULE(240 MG) BY MOUTH DAILY Patient taking differently: TAKE 1.5 CAPSULES (TOTAL 360 MG) BY MOUTH DAILY 08/03/17   Roma Schanz R, DO  cholecalciferol (VITAMIN D) 1000 UNITS tablet Take 1,000 Units by mouth daily.      [provider]  diltiazem (CARDIZEM CD) 120 MG 24 hr capsule Take 1 capsule (120 mg total) by mouth daily. 08/16/17   Copland, Gay Filler, MD  fluticasone (FLONASE) 50 MCG/ACT nasal spray Place 2 sprays into both nostrils daily. 03/31/17   [name], DO  Ginkgo Biloba (GINKGO PO) Take 1 tablet by mouth daily.    [provider]  glucosamine-chondroitin 500-400 MG tablet Take 1 tablet by mouth 3 (three) times daily.    [provider]  guaiFENesin (MUCINEX) 600 MG 12 hr tablet Take 1 tablet (600 mg total) by mouth 2 (two) times daily as needed for cough or to loosen phlegm. 06/12/17   Nche, Charlene Brooke, NP  irbesartan (AVAPRO) 300 MG tablet TAKE 1 TABLET(300 MG) BY MOUTH DAILY Patient taking differently: TAKE 1 TABLET(300 MG) BY MOUTH DAILY AT NIGHT 08/15/17   Carollee Herter, Alferd Apa, DO  latanoprost (XALATAN) 0.005 % ophthalmic solution Place 1 drop into both eyes at bedtime.    [provider]  levofloxacin (LEVAQUIN) 500 MG tablet Take  1 tablet (500 mg total) by mouth daily. 06/23/17   [name], DO  loratadine (CLARITIN) 10 MG tablet Take 10 mg by mouth daily.      [provider]  LORazepam (ATIVAN) 1 MG tablet TAKE 1 TABLET EVERY 8 HOURS 12/22/15   Carollee Herter, Alferd Apa, DO  MAGNESIUM PO Take 500 mg by mouth daily.     [provider]  meclizine (ANTIVERT) 25 MG tablet TAKE 1 TABLET BY MOUTH 3 TIMES A DAY AS NEEDED 06/19/17   Carollee Herter, Alferd Apa, DO  meloxicam (MOBIC) 15 MG tablet TAKE 1/2 TO 1 TABLET BY   MOUTH EVERY DAY AS NEEDED 07/04/16   Carollee Herter, Alferd Apa, DO  Misc Natural Products (LUTEIN 20 PO) Take 1 capsule by mouth daily.    [provider]  multivitamin-lutein (OCUVITE-LUTEIN) CAPS Take 1 capsule by mouth daily.    [provider]  ondansetron (ZOFRAN) 4 MG tablet Take 1 tablet (4 mg total) by mouth every 8 (eight) hours as needed for nausea or vomiting. 06/16/17   Shelda Pal, DO  promethazine-dextromethorphan (PROMETHAZINE-DM) 6.25-15 MG/5ML syrup Take 2.5 mLs by mouth 3 (three) times daily between meals as needed for cough. 06/12/17   Nche, Charlene Brooke, NP  spironolactone (ALDACTONE) 25 MG tablet Take 1 tablet (25 mg total) by mouth daily. 08/24/17   [name], DO                                                                                                                                    Past Surgical History Past Surgical History:  Procedure Laterality Date  . BREAST SURGERY Left    Lumpectomy  . DILATION AND CURETTAGE OF UTERUS     x3  . ganglion cyst r wrist    . LUMBAR LAMINECTOMY    . SPINE SURGERY  11/2010   fusion--jenkins   Family History Family History  Problem Relation Age of Onset  . Hypertension Father   . Kidney disease Father   . Hypertension Sister   . Coronary artery disease Other        P uncles    Social History Social History   Tobacco Use  . Smoking status: Never Smoker  . Smokeless tobacco: Never Used  Substance Use Topics  . Alcohol use: No  . Drug use: No   Allergies Amoxicillin; Hydrocodone; Plendil [felodipine]; Propoxyphene n-acetaminophen; Sertraline hcl; and Tenex [guanfacine hcl]  Review of Systems Review of Systems All other systems are reviewed and are negative for acute change except as noted in the HPI  Physical Exam Vital Signs  I have reviewed the triage vital signs BP (!) 158/83   Pulse 60   Temp 97.9 F (36.6 C) (Oral)   Resp 20   Ht 5' (1.524 m)   Wt 51.7 kg (114  lb)   SpO2 100%   BMI 22.26 kg/m  Physical Exam  Constitutional: She is oriented to person, place, and time. She appears well-developed and well-nourished. No distress.  HENT:  Head: Normocephalic and atraumatic.  Nose: Nose normal.  Eyes: Conjunctivae and EOM are normal. Pupils are equal, round, and reactive to light. Right eye exhibits no discharge. Left eye exhibits no discharge. No scleral icterus.  Neck: Normal range of motion. Neck supple.  Cardiovascular: Normal rate and regular rhythm. Exam reveals no gallop and no friction rub.  No murmur heard. Pulmonary/Chest: Effort normal and breath sounds normal. No stridor. No respiratory distress. She has no rales.  Abdominal: Soft. She exhibits no distension. There is no tenderness.  Musculoskeletal: She exhibits no edema or tenderness.  Kyphotic  Neurological: She is alert and oriented to person, place, and time.  Mental Status:  Alert and oriented to person, place, and time.  Attention and concentration normal.  Speech clear.  Recent memory is intact  Cranial Nerves:  II Visual Fields: Intact to confrontation. Visual fields intact. III, IV, VI: Pupils equal and reactive to light and near. Full eye movement without nystagmus  V Facial Sensation: Normal. No weakness of masticatory muscles  VII: No facial weakness or asymmetry  VIII Auditory Acuity: Grossly normal  IX/X: The uvula is midline; the palate elevates symmetrically  XI: Normal sternocleidomastoid and trapezius strength  XII: The tongue is midline. No atrophy or fasciculations.   Motor System: Muscle Strength: 5/5 and symmetric in the upper and lower extremities. No pronation or drift.  Muscle Tone: Tone and muscle bulk are normal in the upper and lower extremities.   Reflexes: DTRs: 1+ and symmetrical in all four extremities. No Clonus Coordination: Intact finger-to-nose, heel-to-shin. No tremor.  Sensation: Intact to light touch, and pinprick.   Gait: Routine gait  normal.  HINTS: Nystagmus: none Head impulse: abnormal with impulse bilaterally Skew: normal   Skin: Skin is warm and dry. No rash noted. She is not diaphoretic. No erythema.  Psychiatric: She has a normal mood and affect.  Vitals reviewed.   ED Results and Treatments Labs (all labs ordered are listed, but only abnormal results are displayed) Labs Reviewed  BASIC METABOLIC PANEL - Abnormal; Notable for the following components:      Result Value   Sodium 131 (*)    Chloride 98 (*)    Glucose, Bld 105 (*)    All other components within normal limits                                                                                                                         EKG  EKG Interpretation  Date/Time:    Ventricular Rate:    PR Interval:    QRS Duration:   QT Interval:    QTC Calculation:   R Axis:     Text Interpretation:        Radiology No results found. Pertinent labs & imaging results that were available during my care of the patient were reviewed by  me and considered in my medical decision making (see chart for details).  Medications Ordered in ED Medications - No data to display                                                                                                                                  Procedures Procedures  (including critical care time)  Medical Decision Making / ED Course I have reviewed the nursing notes for this encounter and the patient's prior records (if available in EHR or on provided paperwork).    Exam nonfocal.  Hints exam reassuring for peripheral etiology.  Doubt CVA, ICH.  Patient without chest pain or shortness of breath concerning for heart strain.  BMP without evidence of renal insufficiency.  Patient initial blood pressure  With systolics in the 062B, went down to 150s without intervention here.  The patient appears reasonably screened and/or stabilized for discharge and I doubt any other medical condition or other  [facility] requiring further screening, evaluation, or treatment in the ED at this time prior to discharge.  The patient is safe for discharge with strict return precautions.   Final Clinical Impression(s) / ED Diagnoses Final diagnoses:  Secondary hypertension  Vertigo    Disposition: Discharge  Condition: Good  I have discussed the results, Dx and Tx plan with the patient who expressed understanding and agree(s) with the plan. Discharge instructions discussed at great length. The patient was given strict return precautions who verbalized understanding of the instructions. No further questions at time of discharge.    ED Discharge Orders    None       Follow Up: [name], DO [address] [place] [address] [phone]  Schedule an appointment as soon as possible for a visit  As needed     This chart was dictated using voice recognition software.  Despite best efforts to proofread,  errors can occur which can change the documentation meaning.   Fatima Blank, MD 08/29/17 [phone]

## 2017-08-30 ENCOUNTER — Ambulatory Visit (INDEPENDENT_AMBULATORY_CARE_PROVIDER_SITE_OTHER): Payer: Medicare Other | Admitting: Family Medicine

## 2017-08-30 ENCOUNTER — Encounter: Payer: Self-pay | Admitting: Family Medicine

## 2017-08-30 VITALS — BP 140/72 | HR 55 | Temp 97.8°F | Ht 60.0 in | Wt 115.0 lb

## 2017-08-30 DIAGNOSIS — R0989 Other specified symptoms and signs involving the circulatory and respiratory systems: Secondary | ICD-10-CM | POA: Diagnosis not present

## 2017-08-30 DIAGNOSIS — Z5181 Encounter for therapeutic drug level monitoring: Secondary | ICD-10-CM | POA: Diagnosis not present

## 2017-08-30 DIAGNOSIS — I10 Essential (primary) hypertension: Secondary | ICD-10-CM

## 2017-08-30 DIAGNOSIS — E871 Hypo-osmolality and hyponatremia: Secondary | ICD-10-CM | POA: Diagnosis not present

## 2017-08-30 LAB — TSH: TSH: 1.21 u[IU]/mL (ref 0.35–4.50)

## 2017-08-30 LAB — CBC
HCT: 39.3 % (ref 36.0–46.0)
Hemoglobin: 13.4 g/dL (ref 12.0–15.0)
MCHC: 34.1 g/dL (ref 30.0–36.0)
MCV: 89.3 fl (ref 78.0–100.0)
Platelets: 265 10*3/uL (ref 150.0–400.0)
RBC: 4.4 Mil/uL (ref 3.87–5.11)
RDW: 14.5 % (ref 11.5–15.5)
WBC: 4.9 10*3/uL (ref 4.0–10.5)

## 2017-08-30 NOTE — Patient Instructions (Signed)
It was good to see you today- I will be in touch with your results asap Avoid checking your blood pressure too often as this may lead to anxiety.  Perhaps every 2nd or 3rd day.  We would like to see you 140 -150/ 70-90.    If you do not continue to fall in this range (most of the time!) please contact Dr. Etter Sjogren

## 2017-08-31 LAB — COMPREHENSIVE METABOLIC PANEL
ALK PHOS: 68 U/L (ref 39–117)
ALT: 14 U/L (ref 0–35)
AST: 20 U/L (ref 0–37)
Albumin: 4.2 g/dL (ref 3.5–5.2)
BILIRUBIN TOTAL: 0.3 mg/dL (ref 0.2–1.2)
BUN: 19 mg/dL (ref 6–23)
CO2: 29 mEq/L (ref 19–32)
CREATININE: 0.77 mg/dL (ref 0.40–1.20)
Calcium: 10.2 mg/dL (ref 8.4–10.5)
Chloride: 97 mEq/L (ref 96–112)
GFR: 75.62 mL/min (ref >60.00)
GLUCOSE: 149 mg/dL — AB (ref 70–99)
Potassium: 5.1 mEq/L (ref 3.5–5.1)
SODIUM: 135 meq/L (ref 135–145)
TOTAL PROTEIN: 6.6 g/dL (ref 6.0–8.3)

## 2017-09-01 ENCOUNTER — Encounter: Payer: Self-pay | Admitting: Family Medicine

## 2017-09-01 NOTE — Addendum Note (Signed)
Addended by: Lamar Blinks C on: 09/01/2017 05:55 AM   Modules accepted: Orders

## 2017-09-08 ENCOUNTER — Other Ambulatory Visit (INDEPENDENT_AMBULATORY_CARE_PROVIDER_SITE_OTHER): Payer: Medicare Other

## 2017-09-08 DIAGNOSIS — Z5181 Encounter for therapeutic drug level monitoring: Secondary | ICD-10-CM | POA: Diagnosis not present

## 2017-09-08 LAB — POTASSIUM: Potassium: 4.1 mEq/L (ref 3.5–5.1)

## 2017-09-09 ENCOUNTER — Encounter: Payer: Self-pay | Admitting: Family Medicine

## 2017-09-12 ENCOUNTER — Other Ambulatory Visit: Payer: Self-pay | Admitting: Family Medicine

## 2017-09-12 DIAGNOSIS — I10 Essential (primary) hypertension: Secondary | ICD-10-CM

## 2017-09-25 ENCOUNTER — Ambulatory Visit: Payer: Medicare Other | Admitting: Family Medicine

## 2017-09-25 ENCOUNTER — Encounter: Payer: Self-pay | Admitting: Family Medicine

## 2017-09-25 ENCOUNTER — Ambulatory Visit (INDEPENDENT_AMBULATORY_CARE_PROVIDER_SITE_OTHER): Payer: Medicare Other | Admitting: Family Medicine

## 2017-09-25 DIAGNOSIS — I10 Essential (primary) hypertension: Secondary | ICD-10-CM | POA: Diagnosis not present

## 2017-09-25 MED ORDER — ATENOLOL 50 MG PO TABS: ORAL_TABLET | ORAL | 1 refills | Status: DC

## 2017-09-25 MED ORDER — IRBESARTAN 300 MG PO TABS: ORAL_TABLET | ORAL | 1 refills | Status: DC

## 2017-09-25 MED ORDER — NONFORMULARY OR COMPOUNDED ITEM: 0 refills | Status: AC

## 2017-09-25 NOTE — Patient Instructions (Signed)

## 2017-09-25 NOTE — Progress Notes (Signed)
Patient ID: Cassidy Brown, female    DOB: Mar 05, 1930  Age: 84 y.o. MRN: [id number]    Subjective:  Subjective  HPI Cassidy Brown presents for bp f/u.  Pt has not been taking spironolactone or avapro.   She is concerned because her pulse is low but is not dizzy/ lightheaded.   No chest pain, no sob.    Review of Systems  Constitutional: Negative for appetite change, diaphoresis, fatigue and unexpected weight change.  Eyes: Negative for pain, redness and visual disturbance.  Respiratory: Negative for cough, chest tightness, shortness of breath and wheezing.   Cardiovascular: Negative for chest pain, palpitations and leg swelling.  Endocrine: Negative for cold intolerance, heat intolerance, polydipsia, polyphagia and polyuria.  Genitourinary: Negative for difficulty urinating, dysuria and frequency.  Neurological: Negative for dizziness, light-headedness, numbness and headaches.    History Past Medical History:  Diagnosis Date  . Allergy   . Cancer ([place])    breast,hx of  . Diverticulosis   . Glaucoma   . Hypertension   . Osteoporosis   . Poliomyelitis   . Vertigo    dizziness    She has a past surgical history that includes Breast surgery (Left); Lumbar laminectomy; Dilation and curettage of uterus; ganglion cyst r wrist; and Spine surgery (11/2010).   Her family history includes Coronary artery disease in her other; Hypertension in her father and sister; Kidney disease in her father.She reports that she has never smoked. She has never used smokeless tobacco. She reports that she does not drink alcohol or use drugs.  Current Outpatient Medications on File Prior to Visit  Medication Sig Dispense Refill  . azelastine (ASTELIN) 0.1 % nasal spray Place 2 sprays into both nostrils 2 (two) times daily. Use in each nostril as directed 30 mL 12  . b complex vitamins tablet Take 1 tablet by mouth daily.    . calcium citrate-vitamin D (CITRACAL+D) 315-200 MG-UNIT per tablet Take 1 tablet by  mouth 3 (three) times daily.    . cholecalciferol (VITAMIN D) 1000 UNITS tablet Take 1,000 Units by mouth daily.      Marland Kitchen diltiazem (CARDIZEM CD) 240 MG 24 hr capsule TAKE 1.5 CAPSULES (TOTAL 360 MG) BY MOUTH DAILY 30 capsule 0  . fluticasone (FLONASE) 50 MCG/ACT nasal spray Place 2 sprays into both nostrils daily. 16 g 6  . Ginkgo Biloba (GINKGO PO) Take 1 tablet by mouth daily.    Marland Kitchen glucosamine-chondroitin 500-400 MG tablet Take 1 tablet by mouth 3 (three) times daily.    Marland Kitchen guaiFENesin (MUCINEX) 600 MG 12 hr tablet Take 1 tablet (600 mg total) by mouth 2 (two) times daily as needed for cough or to loosen phlegm. 14 tablet 0  . latanoprost (XALATAN) 0.005 % ophthalmic solution Place 1 drop into both eyes at bedtime.    Marland Kitchen loratadine (CLARITIN) 10 MG tablet Take 10 mg by mouth daily.      Marland Kitchen LORazepam (ATIVAN) 1 MG tablet TAKE 1 TABLET EVERY 8 HOURS 60 tablet 0  . MAGNESIUM PO Take 500 mg by mouth daily.     . meclizine (ANTIVERT) 25 MG tablet TAKE 1 TABLET BY MOUTH 3 TIMES A DAY AS NEEDED 60 tablet 0  . meloxicam (MOBIC) 15 MG tablet TAKE 1/2 TO 1 TABLET BY  MOUTH EVERY DAY AS NEEDED 90 tablet 2  . Misc Natural Products (LUTEIN 20 PO) Take 1 capsule by mouth daily.    . multivitamin-lutein (OCUVITE-LUTEIN) CAPS Take 1 capsule by mouth daily.  No current facility-administered medications on file prior to visit.      Objective:  Objective  Physical Exam  Constitutional: She is oriented to person, place, and time. She appears well-developed and well-nourished.  HENT:  Head: Normocephalic and atraumatic.  Eyes: Conjunctivae and EOM are normal.  Neck: Normal range of motion. Neck supple. No JVD present. Carotid bruit is not present. No thyromegaly present.  Cardiovascular: Normal rate, regular rhythm and normal heart sounds.  No murmur heard. Pulmonary/Chest: Effort normal and breath sounds normal. No respiratory distress. She has no wheezes. She has no rales. She exhibits no tenderness.    Musculoskeletal: She exhibits no edema.  Neurological: She is alert and oriented to person, place, and time.  Psychiatric: She has a normal mood and affect.  Nursing note and vitals reviewed.  BP 136/76 (BP Location: Right Arm, Cuff Size: Normal)   Pulse 62   Temp 97.6 F (36.4 C) (Oral)   Resp 16   Ht 5' (1.524 m)   Wt 116 lb 9.6 oz (52.9 kg)   SpO2 97%   BMI 22.77 kg/m  Wt Readings from Last 3 Encounters:  09/25/17 116 lb 9.6 oz (52.9 kg)  08/30/17 115 lb (52.2 kg)  08/29/17 114 lb (51.7 kg)     Lab Results  Component Value Date   WBC 4.9 08/30/2017   HGB 13.4 08/30/2017   HCT 39.3 08/30/2017   PLT 265.0 08/30/2017   GLUCOSE 149 (H) 08/30/2017   CHOL 168 12/29/2016   TRIG 72.0 12/29/2016   HDL 76.90 12/29/2016   LDLCALC 77 12/29/2016   ALT 14 08/30/2017   AST 20 08/30/2017   NA 135 08/30/2017   K 4.1 09/08/2017   CL 97 08/30/2017   CREATININE 0.77 08/30/2017   BUN 19 08/30/2017   CO2 29 08/30/2017   TSH 1.21 08/30/2017   HGBA1C 5.5 08/20/2013   MICROALBUR 0.9 09/02/2014    No results found.   Assessment & Plan:  Plan  I have discontinued Dyasia A. Nicoll's spironolactone. I have also changed her atenolol. Additionally, I am having her start on NONFORMULARY OR COMPOUNDED ITEM. Lastly, I am having her maintain her loratadine, cholecalciferol, MAGNESIUM PO, multivitamin-lutein, calcium citrate-vitamin D, Ginkgo Biloba (GINKGO PO), glucosamine-chondroitin, Misc Natural Products (LUTEIN 20 PO), b complex vitamins, LORazepam, meloxicam, latanoprost, fluticasone, guaiFENesin, meclizine, azelastine, diltiazem, and irbesartan.  Meds ordered this encounter  Medications  . NONFORMULARY OR COMPOUNDED ITEM    Sig: Light weight walker #1  As directed    Dx--balance issues,  Osteoporosis, dizziness    Dispense:  1 each    Refill:  0  . atenolol (TENORMIN) 50 MG tablet    Sig: 1/2  po qd    Dispense:  90 tablet    Refill:  1  . irbesartan (AVAPRO) 300 MG tablet     Sig: TAKE 1 TABLET(300 MG) BY MOUTH DAILY    Dispense:  90 tablet    Refill:  1    Problem List Items Addressed This Visit      Unprioritized   Essential hypertension   Relevant Medications   atenolol (TENORMIN) 50 MG tablet   irbesartan (AVAPRO) 300 MG tablet    restart avapro,  Dec atenolol to 25 mg and con't diltiazem  Spironolactone d/c  Pt will send in bp readings in 2 weeks  Keep June cpe  Follow-up: Return in about 3 months (around 12/25/2017) for hypertension.  [name], DO

## 2017-10-04 ENCOUNTER — Telehealth: Payer: Self-pay | Admitting: Family Medicine

## 2017-10-04 DIAGNOSIS — I10 Essential (primary) hypertension: Secondary | ICD-10-CM

## 2017-10-04 NOTE — Telephone Encounter (Signed)
Copied from [place] [phone]. Topic: Quick Communication - Rx Refill/Question >> Oct 04, 2017  3:50 PM Scherrie Gerlach wrote: Medication:diltiazem (CARDIZEM CD) 240 MG 24 hr capsule  Has the patient contacted their pharmacy? No Pt put on a 30 day sent to local pharmacy and was instructed to call back if that was working for a 90 day refill Pt would like a 90 day sent to [facility], [place] [phone] (Phone) [phone] (Fax)

## 2017-10-05 NOTE — Telephone Encounter (Signed)
Refill Request Cardizem CD 240  MG 24 Hour capsule -  Was  Given 30  Day  Supply to local  Pharmacy and was advised to call back if it was working pt is requesting a 90 day supply to mail order  [place] 09/25/2017  Pharmacy requested [facility]  Mail service

## 2017-10-10 MED ORDER — DILTIAZEM HCL ER COATED BEADS 240 MG PO CP24: ORAL_CAPSULE | ORAL | 1 refills | Status: DC

## 2017-10-10 MED ORDER — DILTIAZEM HCL ER COATED BEADS 240 MG PO CP24: ORAL_CAPSULE | ORAL | 0 refills | Status: DC

## 2017-10-10 NOTE — Telephone Encounter (Signed)
Patient notified.  She was upset that her refill was not taken care of on the day she was called.  Advised that I apologize about the delay.  30 day supply sent to local pharmacy and 90 day sent to mail order.

## 2017-10-11 ENCOUNTER — Telehealth: Payer: Self-pay | Admitting: Family Medicine

## 2017-10-11 DIAGNOSIS — I10 Essential (primary) hypertension: Secondary | ICD-10-CM

## 2017-10-11 NOTE — Telephone Encounter (Signed)
Copied from [place] [id number]. Topic: Quick Communication - See Telephone Encounter >> Oct 11, 2017  1:59 PM Aurelio Brash B wrote: CRM for notification. See Telephone encounter for: 10/11/17.  PT called to make sure the 240  was what she is supposed to take of  diltiazem (CARDIZEM CD)  capsule She states she was taking 360 but was changed to 240 and wants to make sure that's what she is still suppose to take.

## 2017-10-11 NOTE — Telephone Encounter (Signed)
Spoke with pt she was previously taking diltiazem 240mg  once a day and 120mg  once a day. Pt thinks the 120mg  capsule was taken away 1 - 2 months ago and states she has only been taking 240mg , 1 capsule daily. States Rx that was sent at last OV says 240mg , 1.5 capsusle daily but she cannot split the capsule in 1/2 so she has only been taking the 240mg  capsule once a day. Wants to verify if this is the correct dose?  Pt then needs RX with correct directions sent to mail order.  Please advise?

## 2017-10-13 ENCOUNTER — Telehealth: Payer: Self-pay | Admitting: *Deleted

## 2017-10-13 MED ORDER — DILTIAZEM HCL ER COATED BEADS 240 MG PO CP24: 240.0000 mg | ORAL_CAPSULE | Freq: Every day | ORAL | 1 refills | Status: DC

## 2017-10-13 NOTE — Telephone Encounter (Signed)
Should be once a day New rx sent to mail order

## 2017-10-13 NOTE — Telephone Encounter (Signed)
Copied from [place] [id number]. Topic: General - Other >> Oct 13, 2017  3:04 PM Carolyn Stare wrote:  Pt call to report her BP   4/15//19 thru 10/12/17  Top was ranging 140  to low 150's Bottom number 60/70 or 80's more so the 70's   Pulse range for 40's to low 50's

## 2017-10-16 NOTE — Telephone Encounter (Signed)
Looks good---  How is she feeling? We will see her next month

## 2017-10-17 NOTE — Telephone Encounter (Signed)
Patient notified and verbalized understanding. She states she is feeling ok! She has upcoming appointment in July for CPE.

## 2017-10-24 ENCOUNTER — Telehealth: Payer: Self-pay | Admitting: Family Medicine

## 2017-10-24 NOTE — Telephone Encounter (Signed)
Copied from [place] [phone]. Topic: Quick Communication - See Telephone Encounter >> Oct 24, 2017 10:32 AM Hewitt Shorts wrote: Pt was told to call and let the provider know what her blood pressure readings average and to see which medication to increase if necessary 160/70 and 170/80 she is taking diltiazem and irbesartan -she states she does not drink enough water to have the irbesartan increased   Best number is [phone]

## 2017-10-24 NOTE — Telephone Encounter (Signed)
Please advise 

## 2017-10-24 NOTE — Telephone Encounter (Signed)
She should be on atenolol 1/2 tab too

## 2017-10-25 NOTE — Telephone Encounter (Signed)
Was her pulse still low?   We may need to stop atenolol---  And use something else

## 2017-10-25 NOTE — Telephone Encounter (Signed)
Patient states that she is taking the atenolol as well 1/2 tab every morning.

## 2017-10-26 NOTE — Telephone Encounter (Signed)
She states that her pulse has been in the high 48-upper 50s.

## 2017-10-26 NOTE — Telephone Encounter (Signed)
Stop atenolol  Start coreg 6.25mg  bid #60 2 refills Let us know next week how things are

## 2017-10-30 MED ORDER — CARVEDILOL 6.25 MG PO TABS: 6.2500 mg | ORAL_TABLET | Freq: Two times a day (BID) | ORAL | 2 refills | Status: DC

## 2017-10-30 NOTE — Telephone Encounter (Signed)
Patient notified and prescription sent in.  She will call next week to let us know how things are.

## 2017-11-14 ENCOUNTER — Telehealth: Payer: Self-pay | Admitting: *Deleted

## 2017-11-14 ENCOUNTER — Telehealth: Payer: Self-pay | Admitting: Family Medicine

## 2017-11-14 DIAGNOSIS — I10 Essential (primary) hypertension: Secondary | ICD-10-CM

## 2017-11-14 NOTE — Telephone Encounter (Signed)
Copied from [place] [id number]. Topic: Quick Communication - Rx Refill/Question >> Nov 14, 2017  1:04 PM Mcneil, Ja-Kwan wrote: Medication: irbesartan (AVAPRO) 300 MG tablet  Preferred Pharmacy (with phone number or street name): [place], [place] [phone] (Phone) [phone] (Fax)   Agent: Please be advised that RX refills may take up to 3 business days. We ask that you follow-up with your pharmacy.

## 2017-11-14 NOTE — Telephone Encounter (Signed)
Copied from [place] [phone] [phone]. Topic: General - Other >> Nov 14, 2017  1:06 PM Mcneil, Ja-Kwan wrote: Reason for CRM: Pt states she was told to call back to report the results of her BP and pulse. Pt states her BP is normally below 150/70 or 150/80 and her pulse most times is over 60.

## 2017-11-15 MED ORDER — IRBESARTAN 300 MG PO TABS: ORAL_TABLET | ORAL | 1 refills | Status: DC

## 2017-11-15 NOTE — Telephone Encounter (Signed)
Below 150/80 is great!  F/u here in 3 months or sooner if she has a problem.

## 2017-11-15 NOTE — Telephone Encounter (Signed)
Patient notified and she has an upcoming appt in July.

## 2017-11-15 NOTE — Telephone Encounter (Signed)
Rx  Refill  For Avapro:    LOV:  09/25/17 with  [facility]   ///last refill 09/25/17    /// Pharmacy  [facility] mail service

## 2017-11-21 MED ORDER — IRBESARTAN 300 MG PO TABS: ORAL_TABLET | ORAL | 1 refills | Status: DC

## 2017-11-21 NOTE — Telephone Encounter (Signed)
Patient says [facility] never received irbesartan (AVAPRO) 300 MG tablet . Please resend it to [facility] and expect communication from them to expedite it. Patient says this is not good for her blood pressure.

## 2017-11-21 NOTE — Addendum Note (Signed)
Addended by: Kem Boroughs D on: 11/21/2017 01:11 PM   Modules accepted: Orders

## 2017-11-21 NOTE — Telephone Encounter (Signed)
rx sent. It looks like it was ordered as a no print. rx corrected and sent.  Patient notified.

## 2017-12-05 DIAGNOSIS — Z1231 Encounter for screening mammogram for malignant neoplasm of breast: Secondary | ICD-10-CM | POA: Diagnosis not present

## 2017-12-05 LAB — HM MAMMOGRAPHY

## 2017-12-21 ENCOUNTER — Encounter: Payer: Self-pay | Admitting: Family Medicine

## 2018-01-02 ENCOUNTER — Ambulatory Visit (INDEPENDENT_AMBULATORY_CARE_PROVIDER_SITE_OTHER): Payer: Medicare Other | Admitting: Family Medicine

## 2018-01-02 ENCOUNTER — Encounter: Payer: Self-pay | Admitting: Family Medicine

## 2018-01-02 VITALS — BP 168/80 | HR 70 | Temp 98.4°F | Resp 16 | Ht 60.0 in | Wt 115.8 lb

## 2018-01-02 DIAGNOSIS — R350 Frequency of micturition: Secondary | ICD-10-CM

## 2018-01-02 DIAGNOSIS — R739 Hyperglycemia, unspecified: Secondary | ICD-10-CM

## 2018-01-02 DIAGNOSIS — I10 Essential (primary) hypertension: Secondary | ICD-10-CM

## 2018-01-02 LAB — POC URINALSYSI DIPSTICK (AUTOMATED)
Bilirubin, UA: NEGATIVE
Glucose, UA: NEGATIVE
KETONES UA: NEGATIVE
LEUKOCYTES UA: NEGATIVE
Nitrite, UA: NEGATIVE
Protein, UA: NEGATIVE
RBC UA: NEGATIVE
SPEC GRAV UA: 1.015 (ref 1.010–1.025)
Urobilinogen, UA: 0.2 E.U./dL
pH, UA: 7.5 (ref 5.0–8.0)

## 2018-01-02 MED ORDER — CARVEDILOL 12.5 MG PO TABS: 12.5000 mg | ORAL_TABLET | Freq: Two times a day (BID) | ORAL | 3 refills | Status: DC

## 2018-01-02 NOTE — Patient Instructions (Signed)
Preventive Care 82 Years and Older, Female Preventive care refers to lifestyle choices and visits with your health care provider that can promote health and wellness. What does preventive care include?  A yearly physical exam. This is also called an annual well check.  Dental exams once or twice a year.  Routine eye exams. Ask your health care provider how often you should have your eyes checked.  Personal lifestyle choices, including: ? Daily care of your teeth and gums. ? Regular physical activity. ? Eating a healthy diet. ? Avoiding tobacco and drug use. ? Limiting alcohol use. ? Practicing safe sex. ? Taking low-dose aspirin every day. ? Taking vitamin and mineral supplements as recommended by your health care provider. What happens during an annual well check? The services and screenings done by your health care provider during your annual well check will depend on your age, overall health, lifestyle risk factors, and family history of disease. Counseling Your health care provider may ask you questions about your:  Alcohol use.  Tobacco use.  Drug use.  Emotional well-being.  Home and relationship well-being.  Sexual activity.  Eating habits.  History of falls.  Memory and ability to understand (cognition).  Work and work environment.  Reproductive health.  Screening You may have the following tests or measurements:  Height, weight, and BMI.  Blood pressure.  Lipid and cholesterol levels. These may be checked every 5 years, or more frequently if you are over 50 years old.  Skin check.  Lung cancer screening. You may have this screening every year starting at age 55 if you have a 30-pack-year history of smoking and currently smoke or have quit within the past 15 years.  Fecal occult blood test (FOBT) of the stool. You may have this test every year starting at age 50.  Flexible sigmoidoscopy or colonoscopy. You may have a sigmoidoscopy every 5 years or  a colonoscopy every 10 years starting at age 50.  Hepatitis C blood test.  Hepatitis B blood test.  Sexually transmitted disease (STD) testing.  Diabetes screening. This is done by checking your blood sugar (glucose) after you have not eaten for a while (fasting). You may have this done every 1-3 years.  Bone density scan. This is done to screen for osteoporosis. You may have this done starting at age 65.  Mammogram. This may be done every 1-2 years. Talk to your health care provider about how often you should have regular mammograms.  Talk with your health care provider about your test results, treatment options, and if necessary, the need for more tests. Vaccines Your health care provider may recommend certain vaccines, such as:  Influenza vaccine. This is recommended every year.  Tetanus, diphtheria, and acellular pertussis (Tdap, Td) vaccine. You may need a Td booster every 10 years.  Varicella vaccine. You may need this if you have not been vaccinated.  Zoster vaccine. You may need this after age 60.  Measles, mumps, and rubella (MMR) vaccine. You may need at least one dose of MMR if you were born in 1957 or later. You may also need a second dose.  Pneumococcal 13-valent conjugate (PCV13) vaccine. One dose is recommended after age 65.  Pneumococcal polysaccharide (PPSV23) vaccine. One dose is recommended after age 65.  Meningococcal vaccine. You may need this if you have certain conditions.  Hepatitis A vaccine. You may need this if you have certain conditions or if you travel or work in places where you may be exposed to hepatitis   A.  Hepatitis B vaccine. You may need this if you have certain conditions or if you travel or work in places where you may be exposed to hepatitis B.  Haemophilus influenzae type b (Hib) vaccine. You may need this if you have certain conditions.  Talk to your health care provider about which screenings and vaccines you need and how often you  need them. This information is not intended to replace advice given to you by your health care provider. Make sure you discuss any questions you have with your health care provider. Document Released: 06/26/2015 Document Revised: 02/17/2016 Document Reviewed: 03/31/2015 Elsevier Interactive Patient Education  2018 [employer]  

## 2018-01-02 NOTE — Progress Notes (Signed)
Subjective:     Cassidy Brown is a 84 y.o. female and is here for a comprehensive physical exam. The patient reports no problems.  Social History   Socioeconomic History  . Marital status: Married    Spouse name: Not on file  . Number of children: Not on file  . Years of education: Not on file  . Highest education level: Not on file  Occupational History  . Occupation: [employer] --retired    Fish farm manager: RETIRED  Social Needs  . Financial resource strain: Not on file  . Food insecurity:    Worry: Not on file    Inability: Not on file  . Transportation needs:    Medical: Not on file    Non-medical: Not on file  Tobacco Use  . Smoking status: Never Smoker  . Smokeless tobacco: Never Used  Substance and Sexual Activity  . Alcohol use: No  . Drug use: No  . Sexual activity: Never    Partners: Male  Lifestyle  . Physical activity:    Days per week: Not on file    Minutes per session: Not on file  . Stress: Not on file  Relationships  . Social connections:    Talks on phone: Not on file    Gets together: Not on file    Attends religious service: Not on file    Active member of club or organization: Not on file    Attends meetings of clubs or organizations: Not on file    Relationship status: Not on file  . Intimate partner violence:    Fear of current or ex partner: Not on file    Emotionally abused: Not on file    Physically abused: Not on file    Forced sexual activity: Not on file  Other Topics Concern  . Not on file  Social History Narrative   Exercise-- walking 1 x a week   Health Maintenance  Topic Date Due  . TETANUS/TDAP  02/04/1949  . INFLUENZA VACCINE  01/11/2018  . DEXA SCAN  10/26/2018  . MAMMOGRAM  12/06/2018  . PNA vac Low Risk Adult  Completed    The following portions of the patient's history were reviewed and updated as appropriate:  She  has a past medical history of Allergy, Cancer ([place]), Diverticulosis, Glaucoma, Hypertension,  Osteoporosis, Poliomyelitis, and Vertigo. She does not have any pertinent problems on file. She  has a past surgical history that includes Breast surgery (Left); Lumbar laminectomy; Dilation and curettage of uterus; ganglion cyst r wrist; and Spine surgery (11/2010). Her family history includes Coronary artery disease in her other; Hypertension in her father and sister; Kidney disease in her father. She  reports that she has never smoked. She has never used smokeless tobacco. She reports that she does not drink alcohol or use drugs. She has a current medication list which includes the following prescription(s): b complex vitamins, calcium citrate-vitamin d, cholecalciferol, diltiazem, glucosamine-chondroitin, irbesartan, latanoprost, loratadine, lorazepam, magnesium, meclizine, misc natural products, multivitamin-lutein, NONFORMULARY OR COMPOUNDED ITEM, and carvedilol. Current Outpatient Medications on File Prior to Visit  Medication Sig Dispense Refill  . b complex vitamins tablet Take 1 tablet by mouth daily.    . calcium citrate-vitamin D (CITRACAL+D) 315-200 MG-UNIT per tablet Take 1 tablet by mouth 3 (three) times daily.    . cholecalciferol (VITAMIN D) 1000 UNITS tablet Take 1,000 Units by mouth daily.      Marland Kitchen diltiazem (CARDIZEM CD) 240 MG 24 hr capsule Take  1 capsule (240 mg total) by mouth daily. 90 capsule 1  . glucosamine-chondroitin 500-400 MG tablet Take 1 tablet by mouth 3 (three) times daily.    . irbesartan (AVAPRO) 300 MG tablet TAKE 1 TABLET(300 MG) BY MOUTH DAILY 90 tablet 1  . latanoprost (XALATAN) 0.005 % ophthalmic solution Place 1 drop into both eyes at bedtime.    Marland Kitchen loratadine (CLARITIN) 10 MG tablet Take 10 mg by mouth daily.      Marland Kitchen LORazepam (ATIVAN) 1 MG tablet TAKE 1 TABLET EVERY 8 HOURS 60 tablet 0  . MAGNESIUM PO Take 500 mg by mouth daily.     . meclizine (ANTIVERT) 25 MG tablet TAKE 1 TABLET BY MOUTH 3 TIMES A DAY AS NEEDED 60 tablet 0  . Misc Natural Products (LUTEIN  20 PO) Take 1 capsule by mouth daily.    . multivitamin-lutein (OCUVITE-LUTEIN) CAPS Take 1 capsule by mouth daily.    . NONFORMULARY OR COMPOUNDED ITEM Light weight walker #1  As directed    Dx--balance issues,  Osteoporosis, dizziness 1 each 0   No current facility-administered medications on file prior to visit.    She is allergic to amoxicillin; hydrocodone; plendil [felodipine]; propoxyphene n-acetaminophen; sertraline hcl; and tenex [guanfacine hcl]..  Review of Systems Review of Systems  Constitutional: Negative for activity change, appetite change and fatigue.  HENT: Negative for hearing loss, congestion, tinnitus and ear discharge.  dentist q68m Eyes: Negative for visual disturbance (see optho q1y -- vision corrected to 20/20 with glasses).  Respiratory: Negative for cough, chest tightness and shortness of breath.   Cardiovascular: Negative for chest pain, palpitations and leg swelling.  Gastrointestinal: Negative for abdominal pain, diarrhea, constipation and abdominal distention.  Genitourinary: Negative for urgency, frequency, decreased urine volume and difficulty urinating.  Musculoskeletal: Negative for back pain, arthralgias and gait problem.  Skin: Negative for color change, pallor and rash.  Neurological: Negative for dizziness, light-headedness, numbness and headaches.  Hematological: Negative for adenopathy. Does not bruise/bleed easily.  Psychiatric/Behavioral: Negative for suicidal ideas, confusion, sleep disturbance, self-injury, dysphoric mood, decreased concentration and agitation.      Objective:    BP (!) 168/80   Pulse 70   Temp 98.4 F (36.9 C) (Oral)   Resp 16   Ht 5' (1.524 m)   Wt 115 lb 12.8 oz (52.5 kg)   SpO2 99%   BMI 22.62 kg/m  General appearance: alert, cooperative, appears stated age and no distress Head: Normocephalic, without obvious abnormality, atraumatic Eyes: negative findings: lids and lashes normal and pupils equal, round, reactive  to light and accomodation Ears: normal TM's and external ear canals both ears Nose: Nares normal. Septum midline. Mucosa normal. No drainage or sinus tenderness. Throat: lips, mucosa, and tongue normal; teeth and gums normal Neck: no adenopathy, no carotid bruit, no JVD, supple, symmetrical, trachea midline and thyroid not enlarged, symmetric, no tenderness/mass/nodules Back: symmetric, no curvature. ROM normal. No CVA tenderness. Lungs: clear to auscultation bilaterally Breasts: normal appearance, no masses or tenderness Heart: S1, S2 normal Abdomen: soft, non-tender; bowel sounds normal; no masses,  no organomegaly Pelvic: not indicated; post-menopausal, no abnormal Pap smears in past Extremities: extremities normal, atraumatic, no cyanosis or edema Pulses: 2+ and symmetric Skin: Skin color, texture, turgor normal. No rashes or lesions Lymph nodes: Cervical, supraclavicular, and axillary nodes normal. Neurologic: Alert and oriented X 3, normal strength and tone. Normal symmetric reflexes. Normal coordination and gait    Assessment:    Healthy female exam.  Plan:    ghm utd Check labs  See After Visit Summary for Counseling Recommendations    1. Frequency of urination Check urine - POCT Urinalysis Dipstick (Automated); Future - POCT Urinalysis Dipstick (Automated)  2. Hyperglycemia Check labs  - CBC with Differential/Platelet; Future - Comprehensive metabolic panel; Future - Lipid panel; Future - POCT Urinalysis Dipstick (Automated); Future - POCT Urinalysis Dipstick (Automated) - Lipid panel - Comprehensive metabolic panel - CBC with Differential/Platelet  3. Essential hypertension Well controlled, no changes to meds. Encouraged heart healthy diet such as the DASH diet and exercise as tolerated.  - CBC with Differential/Platelet; Future - Comprehensive metabolic panel; Future - Lipid panel; Future - carvedilol (COREG) 12.5 MG tablet; Take 1 tablet (12.5 mg total)  by mouth 2 (two) times daily with a meal.  Dispense: 180 tablet; Refill: 3 - Lipid panel - Comprehensive metabolic panel - CBC with Differential/Platelet

## 2018-01-03 ENCOUNTER — Other Ambulatory Visit: Payer: Medicare Other

## 2018-01-03 LAB — CBC WITH DIFFERENTIAL/PLATELET
Basophils Absolute: 0.1 10*3/uL (ref 0.0–0.1)
Basophils Relative: 1.1 % (ref 0.0–3.0)
Eosinophils Absolute: 0.1 10*3/uL (ref 0.0–0.7)
Eosinophils Relative: 1.8 % (ref 0.0–5.0)
HEMATOCRIT: 41 % (ref 36.0–46.0)
Hemoglobin: 13.6 g/dL (ref 12.0–15.0)
LYMPHS PCT: 20.3 % (ref 12.0–46.0)
Lymphs Abs: 0.9 10*3/uL (ref 0.7–4.0)
MCHC: 33.1 g/dL (ref 30.0–36.0)
MCV: 90.1 fl (ref 78.0–100.0)
Monocytes Absolute: 0.5 10*3/uL (ref 0.1–1.0)
Monocytes Relative: 10.6 % (ref 3.0–12.0)
NEUTROS ABS: 3 10*3/uL (ref 1.4–7.7)
NEUTROS PCT: 66.2 % (ref 43.0–77.0)
PLATELETS: 288 10*3/uL (ref 150.0–400.0)
RBC: 4.56 Mil/uL (ref 3.87–5.11)
RDW: 14.4 % (ref 11.5–15.5)
WBC: 4.6 10*3/uL (ref 4.0–10.5)

## 2018-01-03 LAB — COMPREHENSIVE METABOLIC PANEL
ALT: 18 U/L (ref 0–35)
AST: 21 U/L (ref 0–37)
Albumin: 4.2 g/dL (ref 3.5–5.2)
Alkaline Phosphatase: 79 U/L (ref 39–117)
BUN: 14 mg/dL (ref 6–23)
CALCIUM: 9.9 mg/dL (ref 8.4–10.5)
CO2: 32 meq/L (ref 19–32)
Chloride: 98 mEq/L (ref 96–112)
Creatinine, Ser: 0.54 mg/dL (ref 0.40–1.20)
GFR: 113.79 mL/min (ref >60.00)
Glucose, Bld: 91 mg/dL (ref 70–99)
Potassium: 4.1 mEq/L (ref 3.5–5.1)
Sodium: 136 mEq/L (ref 135–145)
TOTAL PROTEIN: 7.1 g/dL (ref 6.0–8.3)
Total Bilirubin: 0.5 mg/dL (ref 0.2–1.2)

## 2018-01-03 LAB — LIPID PANEL
CHOLESTEROL: 176 mg/dL (ref 0–200)
HDL: 74.3 mg/dL (ref >39.00)
LDL Cholesterol: 77 mg/dL (ref 0–99)
NonHDL: 101.55
TRIGLYCERIDES: 124 mg/dL (ref 0.0–149.0)
Total CHOL/HDL Ratio: 2
VLDL: 24.8 mg/dL (ref 0.0–40.0)

## 2018-01-08 NOTE — Addendum Note (Signed)
Addended byDamita Dunnings D on: 01/08/2018 10:09 AM   Modules accepted: Orders

## 2018-01-19 DIAGNOSIS — R9389 Abnormal findings on diagnostic imaging of other specified body structures: Secondary | ICD-10-CM | POA: Diagnosis not present

## 2018-01-19 DIAGNOSIS — N950 Postmenopausal bleeding: Secondary | ICD-10-CM | POA: Diagnosis not present

## 2018-01-30 DIAGNOSIS — H43813 Vitreous degeneration, bilateral: Secondary | ICD-10-CM | POA: Diagnosis not present

## 2018-01-30 DIAGNOSIS — H2511 Age-related nuclear cataract, right eye: Secondary | ICD-10-CM | POA: Diagnosis not present

## 2018-01-30 DIAGNOSIS — H40013 Open angle with borderline findings, low risk, bilateral: Secondary | ICD-10-CM | POA: Diagnosis not present

## 2018-01-30 DIAGNOSIS — H52203 Unspecified astigmatism, bilateral: Secondary | ICD-10-CM | POA: Diagnosis not present

## 2018-02-13 ENCOUNTER — Other Ambulatory Visit: Payer: Self-pay | Admitting: *Deleted

## 2018-02-13 DIAGNOSIS — R42 Dizziness and giddiness: Secondary | ICD-10-CM

## 2018-02-13 MED ORDER — MECLIZINE HCL 25 MG PO TABS: 25.0000 mg | ORAL_TABLET | Freq: Three times a day (TID) | ORAL | 0 refills | Status: DC | PRN

## 2018-03-01 DIAGNOSIS — H2511 Age-related nuclear cataract, right eye: Secondary | ICD-10-CM | POA: Diagnosis not present

## 2018-03-01 DIAGNOSIS — H25811 Combined forms of age-related cataract, right eye: Secondary | ICD-10-CM | POA: Diagnosis not present

## 2018-03-01 DIAGNOSIS — H25011 Cortical age-related cataract, right eye: Secondary | ICD-10-CM | POA: Diagnosis not present

## 2018-03-28 ENCOUNTER — Other Ambulatory Visit: Payer: Self-pay | Admitting: *Deleted

## 2018-03-28 DIAGNOSIS — I10 Essential (primary) hypertension: Secondary | ICD-10-CM

## 2018-03-28 MED ORDER — IRBESARTAN 300 MG PO TABS: ORAL_TABLET | ORAL | 1 refills | Status: DC

## 2018-03-28 MED ORDER — DILTIAZEM HCL ER COATED BEADS 240 MG PO CP24: 240.0000 mg | ORAL_CAPSULE | Freq: Every day | ORAL | 1 refills | Status: DC

## 2018-04-04 ENCOUNTER — Ambulatory Visit (INDEPENDENT_AMBULATORY_CARE_PROVIDER_SITE_OTHER): Payer: Medicare Other

## 2018-04-04 DIAGNOSIS — Z23 Encounter for immunization: Secondary | ICD-10-CM | POA: Diagnosis not present

## 2018-04-04 NOTE — Progress Notes (Signed)
Patient came into the office to receive her flu vaccine. Vaccine given to patient in the left deltoid. Patient tolerated the injection well & no signs/symptoms of a reaction prior to leaving. VIS given to patient.

## 2018-04-19 ENCOUNTER — Ambulatory Visit: Payer: Self-pay

## 2018-04-19 NOTE — Telephone Encounter (Signed)
Pt called stating that she has had Diarrhea for several weeks.  She is concerned that it is caused by Coreg. She states her stools are loose not watery and about 3-4 times a day if not more. She has had some nausea but no vomiting. She has pain she states is from gas. She is eating and drinking normally. She denies blood in her stool. Appointment scheduled per protocol. Care advice read to patient.  Pt verbalized understanding of all instructions.  Reason for Disposition . [1] MODERATE diarrhea (e.g., 4-6 times / day more than normal) AND [2] age > 70 years  Answer Assessment - Initial Assessment Questions 1. DIARRHEA SEVERITY: "How bad is the diarrhea?" "How many extra stools have you had in the past 24 hours than normal?"    - NO DIARRHEA (SCALE 0)   - MILD (SCALE 1-3): Few loose or mushy BMs; increase of 1-3 stools over normal daily number of stools; mild increase in ostomy output.   -  MODERATE (SCALE 4-7): Increase of 4-6 stools daily over normal; moderate increase in ostomy output. * SEVERE (SCALE 8-10; OR 'WORST POSSIBLE'): Increase of 7 or more stools daily over normal; moderate increase in ostomy output; incontinence.    3-4 or more 2. ONSET: "When did the diarrhea begin?"      Several weeks 3. BM CONSISTENCY: "How loose or watery is the diarrhea?"      loose 4. VOMITING: "Are you also vomiting?" If so, ask: "How many times in the past 24 hours?"      No feel sick sometimes 5. ABDOMINAL PAIN: "Are you having any abdominal pain?" If yes: "What does it feel like?" (e.g., crampy, dull, intermittent, constant)      Gas pains sometimes 6. ABDOMINAL PAIN SEVERITY: If present, ask: "How bad is the pain?"  (e.g., Scale 1-10; mild, moderate, or severe)   - MILD (1-3): doesn't interfere with normal activities, abdomen soft and not tender to touch    - MODERATE (4-7): interferes with normal activities or awakens from sleep, tender to touch    - SEVERE (8-10): excruciating pain, doubled over,  unable to do any normal activities       moderate 7. ORAL INTAKE: If vomiting, "Have you been able to drink liquids?" "How much fluids have you had in the past 24 hours?"     Eating and drinking without problems 8. HYDRATION: "Any signs of dehydration?" (e.g., dry mouth [not just dry lips], too weak to stand, dizziness, new weight loss) "When did you last urinate?"     No weight loss, no dizziness  Moves slowly when stand up 9. EXPOSURE: "Have you traveled to a foreign country recently?" "Have you been exposed to anyone with diarrhea?" "Could you have eaten any food that was spoiled?"     no 10. ANTIBIOTIC USE: "Are you taking antibiotics now or have you taken antibiotics in the past 2 months?"       no 11. OTHER SYMPTOMS: "Do you have any other symptoms?" (e.g., fever, blood in stool)       no 12. PREGNANCY: "Is there any chance you are pregnant?" "When was your last menstrual period?"       N/A  Protocols used: DIARRHEA-A-AH

## 2018-04-20 ENCOUNTER — Ambulatory Visit: Payer: Medicare Other | Admitting: Family Medicine

## 2018-04-20 ENCOUNTER — Encounter: Payer: Self-pay | Admitting: Family Medicine

## 2018-04-20 VITALS — BP 157/86 | HR 80

## 2018-04-20 DIAGNOSIS — I10 Essential (primary) hypertension: Secondary | ICD-10-CM | POA: Diagnosis not present

## 2018-04-20 DIAGNOSIS — F419 Anxiety disorder, unspecified: Secondary | ICD-10-CM

## 2018-04-20 DIAGNOSIS — Z79899 Other long term (current) drug therapy: Secondary | ICD-10-CM | POA: Diagnosis not present

## 2018-04-20 DIAGNOSIS — R195 Other fecal abnormalities: Secondary | ICD-10-CM

## 2018-04-20 DIAGNOSIS — F411 Generalized anxiety disorder: Secondary | ICD-10-CM | POA: Diagnosis not present

## 2018-04-20 MED ORDER — LORAZEPAM 1 MG PO TABS: ORAL_TABLET | ORAL | 0 refills | Status: DC

## 2018-04-20 MED ORDER — NEBIVOLOL HCL 5 MG PO TABS: 5.0000 mg | ORAL_TABLET | Freq: Every day | ORAL | 2 refills | Status: DC

## 2018-04-20 NOTE — Progress Notes (Signed)
Patient ID: Cassidy Brown, female    DOB: [birth year]  Age: 84 y.o. MRN: [id number]    Subjective:  Subjective  HPI Cassidy Brown presents for diarrhea and headache x several weeks   Diarrhea is actually loose stools that come and go  Review of Systems  Constitutional: Negative for appetite change, diaphoresis, fatigue and unexpected weight change.  Eyes: Negative for pain, redness and visual disturbance.  Respiratory: Negative for cough, chest tightness, shortness of breath and wheezing.   Cardiovascular: Negative for chest pain, palpitations and leg swelling.  Gastrointestinal: Positive for diarrhea. Negative for abdominal distention, abdominal pain, anal bleeding, blood in stool and constipation.  Endocrine: Negative for cold intolerance, heat intolerance, polydipsia, polyphagia and polyuria.  Genitourinary: Negative for difficulty urinating, dysuria and frequency.  Neurological: Positive for headaches. Negative for dizziness, light-headedness and numbness.    History Past Medical History:  Diagnosis Date  . Allergy   . Cancer ([place])    breast,hx of  . Diverticulosis   . Glaucoma   . Hypertension   . Osteoporosis   . Poliomyelitis   . Vertigo    dizziness    She has a past surgical history that includes Breast surgery (Left); Lumbar laminectomy; Dilation and curettage of uterus; ganglion cyst r wrist; and Spine surgery (11/2010).   Her family history includes Coronary artery disease in her other; Hypertension in her father and sister; Kidney disease in her father.She reports that she has never smoked. She has never used smokeless tobacco. She reports that she does not drink alcohol or use drugs.  Current Outpatient Medications on File Prior to Visit  Medication Sig Dispense Refill  . b complex vitamins tablet Take 1 tablet by mouth daily.    . calcium citrate-vitamin D (CITRACAL+D) 315-200 MG-UNIT per tablet Take 1 tablet by mouth 3 (three) times daily.    .  cholecalciferol (VITAMIN D) 1000 UNITS tablet Take 1,000 Units by mouth daily.      Marland Kitchen diltiazem (CARDIZEM CD) 240 MG 24 hr capsule Take 1 capsule (240 mg total) by mouth daily. 90 capsule 1  . glucosamine-chondroitin 500-400 MG tablet Take 1 tablet by mouth 3 (three) times daily.    . irbesartan (AVAPRO) 300 MG tablet TAKE 1 TABLET(300 MG) BY MOUTH DAILY 90 tablet 1  . latanoprost (XALATAN) 0.005 % ophthalmic solution Place 1 drop into both eyes at bedtime.    Marland Kitchen loratadine (CLARITIN) 10 MG tablet Take 10 mg by mouth daily.      Marland Kitchen MAGNESIUM PO Take 500 mg by mouth daily.     . meclizine (ANTIVERT) 25 MG tablet Take 1 tablet (25 mg total) by mouth 3 (three) times daily as needed. 60 tablet 0  . Misc Natural Products (LUTEIN 20 PO) Take 1 capsule by mouth daily.    . multivitamin-lutein (OCUVITE-LUTEIN) CAPS Take 1 capsule by mouth daily.    . NONFORMULARY OR COMPOUNDED ITEM Light weight walker #1  As directed    Dx--balance issues,  Osteoporosis, dizziness 1 each 0   No current facility-administered medications on file prior to visit.      Objective:  Objective  Physical Exam  Constitutional: She is oriented to person, place, and time. She appears well-developed and well-nourished.  HENT:  Head: Normocephalic and atraumatic.  Eyes: Conjunctivae and EOM are normal.  Neck: Normal range of motion. Neck supple. No JVD present. Carotid bruit is not present. No thyromegaly present.  Cardiovascular: Normal rate, regular rhythm and normal heart sounds.  No murmur heard. Pulmonary/Chest: Effort normal and breath sounds normal. No respiratory distress. She has no wheezes. She has no rales. She exhibits no tenderness.  Abdominal: Bowel sounds are normal. She exhibits no distension and no mass. There is no tenderness. There is no guarding.  Musculoskeletal: She exhibits no edema.  Neurological: She is alert and oriented to person, place, and time.  Psychiatric: She has a normal mood and affect.    Nursing note and vitals reviewed.  BP (!) 157/86   Pulse 80  Wt Readings from Last 3 Encounters:  01/02/18 115 lb 12.8 oz (52.5 kg)  09/25/17 116 lb 9.6 oz (52.9 kg)  08/30/17 115 lb (52.2 kg)     Lab Results  Component Value Date   WBC 4.6 01/02/2018   HGB 13.6 01/02/2018   HCT 41.0 01/02/2018   PLT 288.0 01/02/2018   GLUCOSE 91 01/02/2018   CHOL 176 01/02/2018   TRIG 124.0 01/02/2018   HDL 74.30 01/02/2018   LDLCALC 77 01/02/2018   ALT 18 01/02/2018   AST 21 01/02/2018   NA 136 01/02/2018   K 4.1 01/02/2018   CL 98 01/02/2018   CREATININE 0.54 01/02/2018   BUN 14 01/02/2018   CO2 32 01/02/2018   TSH 1.21 08/30/2017   HGBA1C 5.5 08/20/2013   MICROALBUR 0.9 09/02/2014    No results found.   Assessment & Plan:  Plan  I have discontinued Mara A. Duck's carvedilol. I am also having her start on nebivolol. Additionally, I am having her maintain her loratadine, cholecalciferol, MAGNESIUM PO, multivitamin-lutein, calcium citrate-vitamin D, glucosamine-chondroitin, Misc Natural Products (LUTEIN 20 PO), b complex vitamins, latanoprost, NONFORMULARY OR COMPOUNDED ITEM, meclizine, diltiazem, irbesartan, and LORazepam.  Meds ordered this encounter  Medications  . nebivolol (BYSTOLIC) 5 MG tablet    Sig: Take 1 tablet (5 mg total) by mouth daily.    Dispense:  30 tablet    Refill:  2  . DISCONTD: LORazepam (ATIVAN) 1 MG tablet    Sig: TAKE 1 TABLET EVERY 8 HOURS    Dispense:  60 tablet    Refill:  0  . LORazepam (ATIVAN) 1 MG tablet    Sig: TAKE 1 TABLET EVERY 8 HOURS    Dispense:  60 tablet    Refill:  0    Problem List Items Addressed This Visit      Unprioritized   Anxiety - Primary    stable      Relevant Medications   LORazepam (ATIVAN) 1 MG tablet   Other Relevant Orders   Pain Mgmt, Profile 8 w/Conf, U (Completed)   Essential hypertension    Poorly controlled will alter medications, encouraged DASH diet, minimize caffeine and obtain adequate sleep.  Report concerning symptoms and follow up as directed and as needed May be what is causing headache      Relevant Medications   nebivolol (BYSTOLIC) 5 MG tablet   Loose stools    Seems to have stopped for now  rto prn        Other Visit Diagnoses    High risk medication use       Relevant Orders   Pain Mgmt, Profile 8 w/Conf, U (Completed)   Generalized anxiety disorder       Relevant Medications   LORazepam (ATIVAN) 1 MG tablet      Follow-up: Return in about 2 weeks (around 05/04/2018), or if symptoms worsen or fail to improve, for bp check.  [name], DO

## 2018-04-20 NOTE — Patient Instructions (Signed)
DASH Eating Plan DASH stands for "Dietary Approaches to Stop Hypertension." The DASH eating plan is a healthy eating plan that has been shown to reduce high blood pressure (hypertension). It may also reduce your risk for type 2 diabetes, heart disease, and stroke. The DASH eating plan may also help with weight loss. What are tips for following this plan? General guidelines  Avoid eating more than 2,300 mg (milligrams) of salt (sodium) a day. If you have hypertension, you may need to reduce your sodium intake to 1,500 mg a day.  Limit alcohol intake to no more than 1 drink a day for nonpregnant women and 2 drinks a day for men. One drink equals 12 oz of beer, 5 oz of wine, or 1 oz of hard liquor.  Work with your health care provider to maintain a healthy body weight or to lose weight. Ask what an ideal weight is for you.  Get at least 30 minutes of exercise that causes your heart to beat faster (aerobic exercise) most days of the week. Activities may include walking, swimming, or biking.  Work with your health care provider or diet and nutrition specialist (dietitian) to adjust your eating plan to your individual calorie needs. Reading food labels  Check food labels for the amount of sodium per serving. Choose foods with less than 5 percent of the Daily Value of sodium. Generally, foods with less than 300 mg of sodium per serving fit into this eating plan.  To find whole grains, look for the word "whole" as the first word in the ingredient list. Shopping  Buy products labeled as "low-sodium" or "no salt added."  Buy fresh foods. Avoid canned foods and premade or frozen meals. Cooking  Avoid adding salt when cooking. Use salt-free seasonings or herbs instead of table salt or sea salt. Check with your health care provider or pharmacist before using salt substitutes.  Do not fry foods. Cook foods using healthy methods such as baking, boiling, grilling, and broiling instead.  Cook with  heart-healthy oils, such as olive, canola, soybean, or sunflower oil. Meal planning   Eat a balanced diet that includes: ? 5 or more servings of fruits and vegetables each day. At each meal, try to fill half of your plate with fruits and vegetables. ? Up to 6-8 servings of whole grains each day. ? Less than 6 oz of lean meat, poultry, or fish each day. A 3-oz serving of meat is about the same size as a deck of cards. One egg equals 1 oz. ? 2 servings of low-fat dairy each day. ? A serving of nuts, seeds, or beans 5 times each week. ? Heart-healthy fats. Healthy fats called Omega-3 fatty acids are found in foods such as flaxseeds and coldwater fish, like sardines, salmon, and mackerel.  Limit how much you eat of the following: ? Canned or prepackaged foods. ? Food that is high in trans fat, such as fried foods. ? Food that is high in saturated fat, such as fatty meat. ? Sweets, desserts, sugary drinks, and other foods with added sugar. ? Full-fat dairy products.  Do not salt foods before eating.  Try to eat at least 2 vegetarian meals each week.  Eat more home-cooked food and less restaurant, buffet, and fast food.  When eating at a restaurant, ask that your food be prepared with less salt or no salt, if possible. What foods are recommended? The items listed may not be a complete list. Talk with your dietitian about what   dietary choices are best for you. Grains Whole-grain or whole-wheat bread. Whole-grain or whole-wheat pasta. Brown rice. Oatmeal. Quinoa. Bulgur. Whole-grain and low-sodium cereals. Pita bread. Low-fat, low-sodium crackers. Whole-wheat flour tortillas. Vegetables Fresh or frozen vegetables (raw, steamed, roasted, or grilled). Low-sodium or reduced-sodium tomato and vegetable juice. Low-sodium or reduced-sodium tomato sauce and tomato paste. Low-sodium or reduced-sodium canned vegetables. Fruits All fresh, dried, or frozen fruit. Canned fruit in natural juice (without  added sugar). Meat and other protein foods Skinless chicken or turkey. Ground chicken or turkey. Pork with fat trimmed off. Fish and seafood. Egg whites. Dried beans, peas, or lentils. Unsalted nuts, nut butters, and seeds. Unsalted canned beans. Lean cuts of beef with fat trimmed off. Low-sodium, lean deli meat. Dairy Low-fat (1%) or fat-free (skim) milk. Fat-free, low-fat, or reduced-fat cheeses. Nonfat, low-sodium ricotta or cottage cheese. Low-fat or nonfat yogurt. Low-fat, low-sodium cheese. Fats and oils Soft margarine without trans fats. Vegetable oil. Low-fat, reduced-fat, or light mayonnaise and salad dressings (reduced-sodium). Canola, safflower, olive, soybean, and sunflower oils. Avocado. Seasoning and other foods Herbs. Spices. Seasoning mixes without salt. Unsalted popcorn and pretzels. Fat-free sweets. What foods are not recommended? The items listed may not be a complete list. Talk with your dietitian about what dietary choices are best for you. Grains Baked goods made with fat, such as croissants, muffins, or some breads. Dry pasta or rice meal packs. Vegetables Creamed or fried vegetables. Vegetables in a cheese sauce. Regular canned vegetables (not low-sodium or reduced-sodium). Regular canned tomato sauce and paste (not low-sodium or reduced-sodium). Regular tomato and vegetable juice (not low-sodium or reduced-sodium). Pickles. Olives. Fruits Canned fruit in a light or heavy syrup. Fried fruit. Fruit in cream or butter sauce. Meat and other protein foods Fatty cuts of meat. Ribs. Fried meat. Bacon. Sausage. Bologna and other processed lunch meats. Salami. Fatback. Hotdogs. Bratwurst. Salted nuts and seeds. Canned beans with added salt. Canned or smoked fish. Whole eggs or egg yolks. Chicken or turkey with skin. Dairy Whole or 2% milk, cream, and half-and-half. Whole or full-fat cream cheese. Whole-fat or sweetened yogurt. Full-fat cheese. Nondairy creamers. Whipped toppings.  Processed cheese and cheese spreads. Fats and oils Butter. Stick margarine. Lard. Shortening. Ghee. Bacon fat. Tropical oils, such as coconut, palm kernel, or palm oil. Seasoning and other foods Salted popcorn and pretzels. Onion salt, garlic salt, seasoned salt, table salt, and sea salt. Worcestershire sauce. Tartar sauce. Barbecue sauce. Teriyaki sauce. Soy sauce, including reduced-sodium. Steak sauce. Canned and packaged gravies. Fish sauce. Oyster sauce. Cocktail sauce. Horseradish that you find on the shelf. Ketchup. Mustard. Meat flavorings and tenderizers. Bouillon cubes. Hot sauce and Tabasco sauce. Premade or packaged marinades. Premade or packaged taco seasonings. Relishes. Regular salad dressings. Where to find more information:  National Heart, Lung, and Blood Institute: www.nhlbi.nih.gov  American Heart Association: www.heart.org Summary  The DASH eating plan is a healthy eating plan that has been shown to reduce high blood pressure (hypertension). It may also reduce your risk for type 2 diabetes, heart disease, and stroke.  With the DASH eating plan, you should limit salt (sodium) intake to 2,300 mg a day. If you have hypertension, you may need to reduce your sodium intake to 1,500 mg a day.  When on the DASH eating plan, aim to eat more fresh fruits and vegetables, whole grains, lean proteins, low-fat dairy, and heart-healthy fats.  Work with your health care provider or diet and nutrition specialist (dietitian) to adjust your eating plan to your individual   calorie needs. This information is not intended to replace advice given to you by your health care provider. Make sure you discuss any questions you have with your health care provider. Document Released: 05/19/2011 Document Revised: 05/23/2016 Document Reviewed: 05/23/2016 [employer] Interactive Patient Education  2018 [employer]  

## 2018-04-23 LAB — PAIN MGMT, PROFILE 8 W/CONF, U
6 ACETYLMORPHINE: NEGATIVE ng/mL (ref <10)
ALPHAHYDROXYMIDAZOLAM: NEGATIVE ng/mL (ref <50)
ALPHAHYDROXYTRIAZOLAM: NEGATIVE ng/mL (ref <50)
AMPHETAMINES: NEGATIVE ng/mL (ref <500)
Alcohol Metabolites: NEGATIVE ng/mL (ref <500)
Alphahydroxyalprazolam: NEGATIVE ng/mL (ref <25)
Aminoclonazepam: NEGATIVE ng/mL (ref <25)
BENZODIAZEPINES: POSITIVE ng/mL — AB (ref <100)
Buprenorphine, Urine: NEGATIVE ng/mL (ref <5)
CREATININE: 31.3 mg/dL
Cocaine Metabolite: NEGATIVE ng/mL (ref <150)
Hydroxyethylflurazepam: NEGATIVE ng/mL (ref <50)
Lorazepam: 79 ng/mL — ABNORMAL HIGH (ref <50)
MARIJUANA METABOLITE: NEGATIVE ng/mL (ref <20)
MDMA: NEGATIVE ng/mL (ref <500)
Nordiazepam: NEGATIVE ng/mL (ref <50)
OPIATES: NEGATIVE ng/mL (ref <100)
OXAZEPAM: NEGATIVE ng/mL (ref <50)
OXIDANT: NEGATIVE ug/mL (ref <200)
Oxycodone: NEGATIVE ng/mL (ref <100)
PH: 6.94 (ref 4.5–9.0)
Temazepam: NEGATIVE ng/mL (ref <50)

## 2018-04-30 DIAGNOSIS — R195 Other fecal abnormalities: Secondary | ICD-10-CM

## 2018-04-30 HISTORY — DX: Other fecal abnormalities: R19.5

## 2018-04-30 NOTE — Assessment & Plan Note (Signed)
Seems to have stopped for now  rto prn

## 2018-04-30 NOTE — Assessment & Plan Note (Signed)
stable °

## 2018-04-30 NOTE — Assessment & Plan Note (Signed)
Poorly controlled will alter medications, encouraged DASH diet, minimize caffeine and obtain adequate sleep. Report concerning symptoms and follow up as directed and as needed May be what is causing headache

## 2018-04-30 NOTE — Assessment & Plan Note (Signed)
Poorly controlled will alter medications, encouraged DASH diet, minimize caffeine and obtain adequate sleep. Report concerning symptoms and follow up as directed and as needed 

## 2018-05-17 ENCOUNTER — Telehealth: Payer: Self-pay | Admitting: Family Medicine

## 2018-05-17 NOTE — Telephone Encounter (Signed)
Great!

## 2018-05-17 NOTE — Telephone Encounter (Signed)
Copied from [place] [id number]. Topic: Quick Communication - See Telephone Encounter >> May 17, 2018 11:27 AM Antonieta Iba C wrote: CRM for notification. See Telephone encounter for: 05/17/18.  Pt says that she has started a new BP medication. Pt called in to make provider aware that the medication is working well for her.

## 2018-06-04 ENCOUNTER — Telehealth: Payer: Self-pay | Admitting: Family Medicine

## 2018-06-04 DIAGNOSIS — I10 Essential (primary) hypertension: Secondary | ICD-10-CM

## 2018-06-04 MED ORDER — NEBIVOLOL HCL 5 MG PO TABS: 5.0000 mg | ORAL_TABLET | Freq: Every day | ORAL | 1 refills | Status: DC

## 2018-06-04 NOTE — Telephone Encounter (Signed)
Copied from [place] [phone]. Topic: Quick Communication - See Telephone Encounter >> Jun 04, 2018 10:20 AM Conception Chancy, NT wrote: CRM for notification. See Telephone encounter for: 06/04/18.  Patient is calling and states that she needs a refill on nebivolol (BYSTOLIC) 5 MG tablet but they are needing a 90 day supply called in because it is cheaper. The pharmacy has a 30 day supply but she is wanting 90 day since it is cheaper. Please advise.   [place], [place] [facility] [address] [place] [address] Phone: [phone] Fax: [phone]

## 2018-06-04 NOTE — Telephone Encounter (Signed)
rx sent in and patient notified. 

## 2018-08-27 ENCOUNTER — Telehealth: Payer: Self-pay | Admitting: Family Medicine

## 2018-08-27 DIAGNOSIS — I10 Essential (primary) hypertension: Secondary | ICD-10-CM

## 2018-08-27 NOTE — Telephone Encounter (Signed)
Copied from [place] [phone]. Topic: Quick Communication - See Telephone Encounter >> Aug 27, 2018  2:54 PM Vernona Rieger wrote: CRM for notification. See Telephone encounter for: 08/27/18.  Patient said that her nebivolol (BYSTOLIC) 5 MG tablet is $188 and can no longer afford this. She would like to know is there another option for her? Please Advise

## 2018-08-27 NOTE — Telephone Encounter (Signed)
She has reacted to most of the others----- call [phone]--- that is number for pt assistance program

## 2018-08-27 NOTE — Telephone Encounter (Signed)
Spoke w/ Pt regarding recommendation. Pt states she doesn't want to go through all that and knows she makes to much money to qualify. She would appreciate it if Dr. Etter Sjogren could prescribe something cheaper.

## 2018-08-27 NOTE — Telephone Encounter (Signed)
toprol xl 50 mg #30 1 po qd  bp check in 3-4 weeks

## 2018-08-27 NOTE — Telephone Encounter (Signed)
Please advise 

## 2018-08-28 ENCOUNTER — Other Ambulatory Visit: Payer: Self-pay | Admitting: Family Medicine

## 2018-08-28 NOTE — Telephone Encounter (Signed)
Notified pt. She voices understanding. She would like to be seen at the [place] location if possible. Pt thinks she has enough medication on hand to get her through the 1st week of April. Referral placed.

## 2018-08-28 NOTE — Telephone Encounter (Signed)
She has had reactions to almost everything else ---  We will need to refer to cardiology for htn management

## 2018-08-28 NOTE — Telephone Encounter (Signed)
Notified pt of below. She states her pulse has been staying in the 40s - 50s. She is concerned about trying the toprol if it might lower her pulse further?  Please advise?

## 2018-09-04 ENCOUNTER — Other Ambulatory Visit: Payer: Self-pay | Admitting: Family Medicine

## 2018-09-04 DIAGNOSIS — R42 Dizziness and giddiness: Secondary | ICD-10-CM

## 2018-09-26 ENCOUNTER — Other Ambulatory Visit: Payer: Self-pay | Admitting: Family Medicine

## 2018-09-26 DIAGNOSIS — I10 Essential (primary) hypertension: Secondary | ICD-10-CM

## 2018-10-31 ENCOUNTER — Other Ambulatory Visit: Payer: Self-pay | Admitting: Family Medicine

## 2018-10-31 DIAGNOSIS — I10 Essential (primary) hypertension: Secondary | ICD-10-CM

## 2018-12-03 ENCOUNTER — Telehealth: Payer: Self-pay | Admitting: Family Medicine

## 2018-12-03 NOTE — Telephone Encounter (Signed)
Medication Refill - Medication: nebivolol (BYSTOLIC) 5 MG tablet    Has the patient contacted their pharmacy? Yes.   (Agent: If no, request that the patient contact the pharmacy for the refill.) (Agent: If yes, when and what did the pharmacy advise?) request sent to office last week   Preferred Pharmacy (with phone number or street name):  [place], [place] [phone] (Phone) [phone] (Fax)     Agent: Please be advised that RX refills may take up to 3 business days. We ask that you follow-up with your pharmacy.

## 2018-12-03 NOTE — Telephone Encounter (Signed)
Was supposed to see cardiology- see telephone note 08/2018.

## 2018-12-04 NOTE — Telephone Encounter (Signed)
Please advise 

## 2018-12-04 NOTE — Telephone Encounter (Signed)
Pt states she is not going to cardiology until it is safe to go.  Pt states they have called, but she is not going anywhere. She has not seen them yet.  Would like this refilled until she can get to see them safely.  Pt states she is about to run out.

## 2018-12-05 ENCOUNTER — Other Ambulatory Visit: Payer: Self-pay | Admitting: Family Medicine

## 2018-12-05 DIAGNOSIS — I10 Essential (primary) hypertension: Secondary | ICD-10-CM

## 2018-12-05 MED ORDER — NEBIVOLOL HCL 5 MG PO TABS: 5.0000 mg | ORAL_TABLET | Freq: Every day | ORAL | 0 refills | Status: DC

## 2018-12-05 NOTE — Telephone Encounter (Signed)
Spoke w/ Pt- informed of PCP recommendations. Pt verbalized understanding.  

## 2018-12-05 NOTE — Telephone Encounter (Signed)
I sent 90 day ----  She does need to schedule at least a virtual visit with cardiology

## 2019-01-23 ENCOUNTER — Other Ambulatory Visit: Payer: Self-pay | Admitting: Family Medicine

## 2019-01-23 DIAGNOSIS — I10 Essential (primary) hypertension: Secondary | ICD-10-CM

## 2019-02-25 ENCOUNTER — Telehealth (INDEPENDENT_AMBULATORY_CARE_PROVIDER_SITE_OTHER): Payer: Medicare Other | Admitting: Cardiology

## 2019-02-25 ENCOUNTER — Other Ambulatory Visit: Payer: Self-pay

## 2019-02-25 ENCOUNTER — Encounter: Payer: Self-pay | Admitting: Cardiology

## 2019-02-25 VITALS — BP 161/80 | HR 80 | Ht 60.0 in | Wt 111.0 lb

## 2019-02-25 DIAGNOSIS — F419 Anxiety disorder, unspecified: Secondary | ICD-10-CM

## 2019-02-25 DIAGNOSIS — R001 Bradycardia, unspecified: Secondary | ICD-10-CM

## 2019-02-25 DIAGNOSIS — R0609 Other forms of dyspnea: Secondary | ICD-10-CM

## 2019-02-25 DIAGNOSIS — R0600 Dyspnea, unspecified: Secondary | ICD-10-CM

## 2019-02-25 DIAGNOSIS — I10 Essential (primary) hypertension: Secondary | ICD-10-CM | POA: Diagnosis not present

## 2019-02-25 HISTORY — DX: Other forms of dyspnea: R06.09

## 2019-02-25 HISTORY — DX: Dyspnea, unspecified: R06.00

## 2019-02-25 HISTORY — DX: Bradycardia, unspecified: R00.1

## 2019-02-25 NOTE — Addendum Note (Signed)
Addended by: Particia Nearing B on: 02/25/2019 02:41 PM   Modules accepted: Orders

## 2019-02-25 NOTE — Progress Notes (Signed)
Virtual Visit via Telephone Note   This visit type was conducted due to national recommendations for restrictions regarding the COVID-19 Pandemic (e.g. social distancing) in an effort to limit this patient's exposure and mitigate transmission in our community.  Due to her co-morbid illnesses, this patient is at least at moderate risk for complications without adequate follow up.  This format is felt to be most appropriate for this patient at this time.  The patient did not have access to video technology/had technical difficulties with video requiring transitioning to audio format only (telephone).  All issues noted in this document were discussed and addressed.  No physical exam could be performed with this format.  Please refer to the patient's chart for her  consent to telehealth for [facility].  Evaluation Performed:  Follow-up visit  This visit type was conducted due to national recommendations for restrictions regarding the COVID-19 Pandemic (e.g. social distancing).  This format is felt to be most appropriate for this patient at this time.  All issues noted in this document were discussed and addressed.  No physical exam was performed (except for noted visual exam findings with Video Visits).  Please refer to the patient's chart (MyChart message for video visits and phone note for telephone visits) for the patient's consent to telehealth for [facility].  Date:  02/25/2019  ID: Cassidy Brown, DOB 1930/03/18, MRN [id number]   Patient Location: [place] [address]   Provider location:   [place] Office  PCP:  Carollee Herter, Alferd Apa, DO  Cardiologist:  Jenne Campus, MD     Chief Complaint: I have a problem my blood pressure  History of Present Illness:    Cassidy Brown is a 85 y.o. female  who presents via audio/video conferencing for a telehealth visit today.  She is struggling with high blood pressure for about 30 years.  Initially it was  very easy to control required 1 medication and then gradually number of medication has been increased.  Lately within last few months she is really struggling trying to keep her blood pressure under control.  Recently the dose of atenolol has been discontinued because of bradycardia and she was put on by systolic.  Overall she is doing well she says she is scared of coronavirus and she preferred to stay at home however she take activity of daily living with no difficulties she pace herself she does get some exertional shortness of breath.  Does not have swelling of lower extremities does not have proximal nocturnal dyspnea.  Usually have no difficulty falling asleep but she wakes up typically at 5:00 in the morning and after that she got difficulty falling asleep.  Never had any heart trouble.  Denies having any chest pain, tightness, pressure, burning in the chest.  Overall seems to be doing well.  Obviously she is concerned about her blood pressure.  She is afraid to check her blood pressure because it is usually high. Does not have family history of premature coronary artery disease Did not smoke Does not exercise on a regular basis  She is aware of needs to maintain low-salt diet   The patient does not have symptoms concerning for COVID-19 infection (fever, chills, cough, or new SHORTNESS OF BREATH).    Prior CV studies:   The following studies were reviewed today:       Past Medical History:  Diagnosis Date  . Allergy   . Cancer ([place])    breast,hx  of  . Diverticulosis   . Glaucoma   . Hypertension   . Osteoporosis   . Poliomyelitis   . Vertigo    dizziness    Past Surgical History:  Procedure Laterality Date  . BREAST SURGERY Left    Lumpectomy  . DILATION AND CURETTAGE OF UTERUS     x3  . ganglion cyst r wrist    . LUMBAR LAMINECTOMY    . SPINE SURGERY  11/2010   fusion--jenkins     Current Meds  Medication Sig  . b complex vitamins tablet Take 1 tablet by mouth  daily.  . calcium citrate-vitamin D (CITRACAL+D) 315-200 MG-UNIT per tablet Take 1 tablet by mouth 3 (three) times daily.  . cholecalciferol (VITAMIN D) 1000 UNITS tablet Take 1,000 Units by mouth daily.    Marland Kitchen diltiazem (CARDIZEM CD) 240 MG 24 hr capsule TAKE 1 CAPSULE BY MOUTH  DAILY  . irbesartan (AVAPRO) 300 MG tablet TAKE 1 TABLET BY MOUTH  DAILY  . loratadine (CLARITIN) 10 MG tablet Take 10 mg by mouth daily.    Marland Kitchen LORazepam (ATIVAN) 1 MG tablet TAKE 1 TABLET EVERY 8 HOURS  . MAGNESIUM PO Take 500 mg by mouth daily.   . meclizine (ANTIVERT) 25 MG tablet TAKE 1 TABLET BY MOUTH THREE TIMES A DAY AS NEEDED  . Misc Natural Products (LUTEIN 20 PO) Take 1 capsule by mouth daily.  . multivitamin-lutein (OCUVITE-LUTEIN) CAPS Take 1 capsule by mouth daily.  . nebivolol (BYSTOLIC) 5 MG tablet Take 1 tablet (5 mg total) by mouth daily.  . NONFORMULARY OR COMPOUNDED ITEM Light weight walker #1  As directed    Dx--balance issues,  Osteoporosis, dizziness      Family History: The patient's family history includes Coronary artery disease in an other family member; Hypertension in her father and sister; Kidney disease in her father.   ROS:   Please see the history of present illness.     All other systems reviewed and are negative.   Labs/Other Tests and Data Reviewed:     Recent Labs: No results found for requested labs within last 8760 hours.  Recent Lipid Panel    Component Value Date/Time   CHOL 176 01/02/2018 1520   TRIG 124.0 01/02/2018 1520   HDL 74.30 01/02/2018 1520   CHOLHDL 2 01/02/2018 1520   VLDL 24.8 01/02/2018 1520   LDLCALC 77 01/02/2018 1520      Exam:    Vital Signs:  BP (!) 161/80   Pulse 80   Ht 5' (1.524 m)   Wt 111 lb (50.3 kg)   BMI 21.68 kg/m     Wt Readings from Last 3 Encounters:  02/25/19 111 lb (50.3 kg)  01/02/18 115 lb 12.8 oz (52.5 kg)  09/25/17 116 lb 9.6 oz (52.9 kg)     Well nourished, well developed in no acute distress. She is talking  to me over the phone.  We are unable to establish video link.  She prefers to have tele-visit rather than come to see [place] in the office because of scared of coronavirus.  She was not in any distress she was quite cheerful and happy to be able to talk to me over the phone.  Diagnosis for this visit:   1. Essential hypertension   2. Anxiety   3. Dyspnea on exertion   4. Bradycardia      ASSESSMENT & PLAN:    1.  Essential hypertension which appears to be difficult to control.  I asked her to check her blood pressure every second or maybe every third day and write it on the piece of paper.  I will ask her to come to my office once and have EKG done echocardiogram as well as Chem-7 basis that we will decide what to do about her medications.  She is unhappy about diastolic mostly because of the price.  Therefore I suspect will be forced to change to some different medications in the future.  She is already on ARB she is also taking Cardizem.  If EKG will be fine we may be forced to increase dose of Cardizem to help with the blood pressure management.   2.  Dyspnea on exertion overall she described the fact that she does not exercise on a regular basis she is trying to take her activity of daily living and she has no difficulty doing it. 3.  Bradycardia caused by a beta-blocker but not critical she denies having any dizziness or passing out.  I asked her to check her heart rate every single time she check her blood pressure.  In the future we may be forced to discontinue Bystolic and entire class of medication may be out of average for her. 4.  Anxiety seems to be stable from that point review.   Nice lady with difficult to control blood pressure will try to establish have significant problem that this will do echocardiogram as well as EKG to look for LVH.  Chem-7 will be done to see if there is any role for potentially adding diuretic.  In the future we may be also forced to increase dose of calcium  channel blocker.  I anticipate need to discontinue Bystolic because of bradycardia as well as because of price.  COVID-19 Education: The signs and symptoms of COVID-19 were discussed with the patient and how to seek care for testing (follow up with PCP or arrange E-visit).  The importance of social distancing was discussed today.  Patient Risk:   After full review of this patients clinical status, I feel that they are at least moderate risk at this time.  Time:   Today, I have spent 31 minutes with the patient with telehealth technology discussing pt health issues.  I spent 5 minutes reviewing her chart before the visit.  Visit was finished at 2:08 PM.    Medication Adjustments/Labs and Tests Ordered: Current medicines are reviewed at length with the patient today.  Concerns regarding medicines are outlined above.  No orders of the defined types were placed in this encounter.  Medication changes: No orders of the defined types were placed in this encounter.    Disposition: Follow-up in 1 month  Signed, Park Liter, MD, [facility] 02/25/2019 2:04 PM    [place]

## 2019-02-25 NOTE — Patient Instructions (Signed)
Medication Instructions:  Your physician recommends that you continue on your current medications as directed. Please refer to the Current Medication list given to you today.  If you need a refill on your cardiac medications before your next appointment, please call your pharmacy.   Lab work: Your physician recommends that you return for lab work in: When you get your echo: BMP  If you have labs (blood work) drawn today and your tests are completely normal, you will receive your results only by: Marland Kitchen MyChart Message (if you have MyChart) OR . A paper copy in the mail If you have any lab test that is abnormal or we need to change your treatment, we will call you to review the results.  Testing/Procedures: Your physician has requested that you have an echocardiogram. Echocardiography is a painless test that uses sound waves to create images of your heart. It provides your doctor with information about the size and shape of your heart and how well your heart's chambers and valves are working. This procedure takes approximately one hour. There are no restrictions for this procedure.    Follow-Up: At [facility], you and your health needs are our priority.  As part of our continuing mission to provide you with exceptional heart care, we have created designated Provider Care Teams.  These Care Teams include your primary Cardiologist (physician) and Advanced Practice Providers (APPs -  Physician Assistants and Nurse Practitioners) who all work together to provide you with the care you need, when you need it. You will need a follow up appointment in 1 months with Dr Agustin Cree  Any Other Special Instructions Will Be Listed Below (If Applicable).

## 2019-03-01 ENCOUNTER — Ambulatory Visit: Payer: Medicare Other | Admitting: Family

## 2019-03-01 ENCOUNTER — Other Ambulatory Visit: Payer: Self-pay

## 2019-03-01 ENCOUNTER — Ambulatory Visit (HOSPITAL_BASED_OUTPATIENT_CLINIC_OR_DEPARTMENT_OTHER)
Admission: RE | Admit: 2019-03-01 | Discharge: 2019-03-01 | Disposition: A | Discharge status: Home / Self Care | Payer: Medicare Other | Source: Ambulatory Visit | Attending: Cardiology | Admitting: Cardiology

## 2019-03-01 DIAGNOSIS — R0609 Other forms of dyspnea: Secondary | ICD-10-CM | POA: Diagnosis not present

## 2019-03-01 DIAGNOSIS — I10 Essential (primary) hypertension: Secondary | ICD-10-CM

## 2019-03-01 NOTE — Progress Notes (Signed)
  Echocardiogram 2D Echocardiogram has been performed.  Cassidy Brown 03/01/2019, 9:59 AM

## 2019-03-04 ENCOUNTER — Encounter: Payer: Self-pay | Admitting: Family

## 2019-03-04 ENCOUNTER — Ambulatory Visit (INDEPENDENT_AMBULATORY_CARE_PROVIDER_SITE_OTHER): Payer: Medicare Other | Admitting: Family

## 2019-03-04 ENCOUNTER — Other Ambulatory Visit: Payer: Self-pay

## 2019-03-04 ENCOUNTER — Telehealth: Payer: Self-pay | Admitting: Emergency Medicine

## 2019-03-04 VITALS — BP 170/70 | HR 68 | Temp 98.2°F | Ht 60.0 in | Wt 114.5 lb

## 2019-03-04 DIAGNOSIS — R42 Dizziness and giddiness: Secondary | ICD-10-CM

## 2019-03-04 DIAGNOSIS — R0609 Other forms of dyspnea: Secondary | ICD-10-CM | POA: Diagnosis not present

## 2019-03-04 DIAGNOSIS — R001 Bradycardia, unspecified: Secondary | ICD-10-CM

## 2019-03-04 DIAGNOSIS — I10 Essential (primary) hypertension: Secondary | ICD-10-CM

## 2019-03-04 NOTE — Telephone Encounter (Signed)
Left message for patient to return call regarding results  

## 2019-03-04 NOTE — Progress Notes (Signed)
Office Visit    Patient Name: Cassidy Brown Date of Encounter: 03/04/2019  Primary Care Provider:  Carollee Herter, Alferd Apa, DO Primary Cardiologist:  Jenne Campus, MD Electrophysiologist:  None   Chief Complaint    Cassidy Brown is a 85 y.o. female with a hx of HTN, bradycardia, anxiety,  presents today for EKG and evaluation of blood pressure.    Past Medical History    Past Medical History:  Diagnosis Date  . Allergy   . Cancer ([place])    breast,hx of  . Diverticulosis   . Glaucoma   . Hypertension   . Osteoporosis   . Poliomyelitis   . Vertigo    dizziness   Past Surgical History:  Procedure Laterality Date  . BREAST SURGERY Left    Lumpectomy  . DILATION AND CURETTAGE OF UTERUS     x3  . ganglion cyst r wrist    . LUMBAR LAMINECTOMY    . SPINE SURGERY  11/2010   fusion--jenkins    Allergies  Allergies  Allergen Reactions  . Amoxicillin Diarrhea    "I had diarrhea really bad."  . Coreg [Carvedilol] Diarrhea  . Hydrocodone   . Plendil [Felodipine]     Caused pains in back and stomach and caused constipation  . Propoxyphene N-Acetaminophen   . Sertraline Hcl Other (See Comments)    Per patient shaking, Headache and weird feeling in her eyes.  Denzil Hughes Hcl]     Made patient feel really bad and was ineffective in decreasing BP    History of Present Illness    Cassidy Brown is a 85 y.o. female with a hx of HTN, bradycardia, anxiety last seen via telemedicine by Dr. Agustin Cree 02/25/19. Presents today for EKG and evaluation of blood pressure.   Very pleasant lady who walks with a rollator after two back surgeries. She and her husband reside together and have been married a long time. He waits for her in the waiting room today during our visit.   Tells me her blood pressure has been difficult to control over the last two years. Previously followed but her PCP, but recently referred to Dr. Agustin Cree for BP management. She mentions that her  Atenolol was discontinued due to bradycardia. Reports home heart rates are consistently in the 50s. Reports intermittent lightheadedness, but no syncope. Tells me she has been intolerant of diuretics for BP management in the past due to "not drinking much water".  She checks her blood pressure a few times per week. Tells me it has been consistently 140-150s/60s when she checks at home.   We discussed bradycardia at length. Reassured her that her EKG today showed no blocks in the electrical activity of her heart. We discussed that bradycardia is not dangerous unless symptomatic. She endorses very intermittent lightheadedness with position changes.   She denies chest pain, pressure, SOB, DOE, palpitations.   EKGs/Labs/Other Studies Reviewed:   The following studies were reviewed today:  EKG:  EKG is ordered today.  The ekg ordered today demonstrates Sr rate 62bpm with T wave inversion in aVR, V1, V2, V3. No acute ST/T wave changes.   Recent Labs: No results found for requested labs within last 8760 hours.  Recent Lipid Panel    Component Value Date/Time   CHOL 176 01/02/2018 1520   TRIG 124.0 01/02/2018 1520   HDL 74.30 01/02/2018 1520   CHOLHDL 2 01/02/2018 1520   VLDL 24.8 01/02/2018 1520   LDLCALC 77 01/02/2018  1520    Home Medications   Current Meds  Medication Sig  . b complex vitamins tablet Take 1 tablet by mouth daily.  . calcium citrate-vitamin D (CITRACAL+D) 315-200 MG-UNIT per tablet Take 1 tablet by mouth 3 (three) times daily.  . cholecalciferol (VITAMIN D) 1000 UNITS tablet Take 1,000 Units by mouth daily.    Marland Kitchen diltiazem (CARDIZEM CD) 240 MG 24 hr capsule TAKE 1 CAPSULE BY MOUTH  DAILY  . irbesartan (AVAPRO) 300 MG tablet TAKE 1 TABLET BY MOUTH  DAILY  . loratadine (CLARITIN) 10 MG tablet Take 10 mg by mouth daily.    Marland Kitchen LORazepam (ATIVAN) 1 MG tablet TAKE 1 TABLET EVERY 8 HOURS  . MAGNESIUM PO Take 500 mg by mouth daily.   . meclizine (ANTIVERT) 25 MG tablet TAKE 1  TABLET BY MOUTH THREE TIMES A DAY AS NEEDED  . Misc Natural Products (LUTEIN 20 PO) Take 1 capsule by mouth daily.  . multivitamin-lutein (OCUVITE-LUTEIN) CAPS Take 1 capsule by mouth daily.  . nebivolol (BYSTOLIC) 5 MG tablet Take 1 tablet (5 mg total) by mouth daily.  . NONFORMULARY OR COMPOUNDED ITEM Light weight  #1  As directed    Dx--balance issues,  Osteoporosis, dizziness      Review of Systems      Review of Systems  Constitution: Negative for chills, fever and malaise/fatigue.  Cardiovascular: Negative for chest pain, irregular heartbeat, leg swelling, near-syncope and palpitations.  Respiratory: Negative for cough, shortness of breath and wheezing.   Musculoskeletal: Positive for back pain (chronic).  Gastrointestinal: Negative for nausea and vomiting.  Neurological: Positive for light-headedness. Negative for focal weakness and weakness.   All other systems reviewed and are otherwise negative except as noted above.  Physical Exam    VS:  BP (!) 170/70 (BP Location: Left Arm, Patient Position: Sitting, Cuff Size: Normal)   Pulse 68   Temp 98.2 F (36.8 C) (Temporal)   Ht 5' (1.524 m)   Wt 114 lb 8 oz (51.9 kg)   SpO2 99%   BMI 22.36 kg/m  , BMI Body mass index is 22.36 kg/m. GEN: Well nourished, thin, well developed, in no acute distress. HEENT: normal. Neck: Supple, no JVD, carotid bruits, or masses. Cardiac: RRR, no murmurs, rubs, or gallops. No clubbing, cyanosis, edema.  Radials/DP/PT 2+ and equal bilaterally.  Respiratory:  Respirations regular and unlabored, clear to auscultation bilaterally. GI: Soft, nontender, nondistended, BS + x 4. MS: No deformity or atrophy. Skin: Warm and dry, no rash. Neuro:  Strength and sensation are intact. Psych: Normal affect.  Accessory Clinical Findings    ECG personally reviewed by me today - SR rate 62bpm with T wave inversion in aVR, V1, V2, V3 - no acute ST/T wave changes.  Assessment & Plan    1. HTN - Some  element of whitecoat HTN as BP initially today 188/64 and improved to 170/70 on recheck. She will continue to check 3-4 times per week. Current home BP 140-150/60. Tells me she is intolerant of diuretics as she does "not drink much water".   She requests to discontinue Bystolic due to cost and impact on HR. Agreeable to continue until BMET ordered today is received.   Goal BP <140/90.  Consider up-titration of Cardizem vs addition of low dose Amlodipine.   Echo 03/01/19 ordered by Dr. Agustin Cree reviewed with her in office visit. Normal LVEF. Diastolic dysfunction noted. Mild MS.   2. Bradycardia - Atenolol previously DC'd by PCP and transitioned to  Bystolic. She is additionally on Cardizem which is a rate limiting medication. EKG today SR 62 bpm. No signs of heart block. Reports home HR 50s. Reassurance provided that bradycardia is treated based off of symptoms not solely the heart rate. She denies pre-syncope, syncope. Careful adjustment of antihypertensive medications.   3. Lightheadedness - Intermittent, noted with position changes. Describes feeling "off balance". Etiology vertigo (noted history) vs orthostatic hypotension. Would hesitate to reduce her SBP far below 140 with risk of falls as we adjust her anti-hypertensive therapy.   Disposition: I will call her tomorrow morning regarding blood results and Bystolic after discussion with Dr. Agustin Cree. Follow up in 3 week(s) with Dr. Agustin Cree as previously scheduled.    Loel Dubonnet, NP 03/04/2019, 3:03 PM

## 2019-03-04 NOTE — Patient Instructions (Addendum)
Medication Instructions:  No medication changes today.   If you need a refill on your cardiac medications before your next appointment, please call your pharmacy.   Lab work: Your physician recommends that you return for lab work today: BMET  If you have labs (blood work) drawn today and your tests are completely normal, you will receive your results only by: Marland Kitchen MyChart Message (if you have MyChart) OR . A paper copy in the mail If you have any lab test that is abnormal or we need to change your treatment, we will call you to review the results.  Testing/Procedures: You had an EKG today.   Follow-Up: At [facility], you and your health needs are our priority.  As part of our continuing mission to provide you with exceptional heart care, we have created designated Provider Care Teams.  These Care Teams include your primary Cardiologist (physician) and Advanced Practice Providers (APPs -  Physician Assistants and Nurse Practitioners) who all work together to provide you with the care you need, when you need it. You will need a follow up appointment in 1 months as previously scheduled.  You may see Dr. Agustin Cree or another member of our [place] Provider Team in [place]: Shirlee More, MD . Jyl Heinz, MD  Any Other Special Instructions Will Be Listed Below (If Applicable).  Keep checking your blood pressure 3-4 times per week and keep a log.   I will call you tomorrow with your lab results and we will discuss your Bystolic.

## 2019-03-06 LAB — BASIC METABOLIC PANEL
BUN/Creatinine Ratio: 15 (ref 12–28)
BUN: 11 mg/dL (ref 8–27)
CO2: 26 mmol/L (ref 20–29)
Calcium: 9.8 mg/dL (ref 8.7–10.3)
Chloride: 100 mmol/L (ref 96–106)
Creatinine, Ser: 0.73 mg/dL (ref 0.57–1.00)
GFR calc Af Amer: 86 mL/min/{1.73_m2} (ref >59)
GFR calc non Af Amer: 74 mL/min/{1.73_m2} (ref >59)
Glucose: 142 mg/dL — ABNORMAL HIGH (ref 65–99)
Potassium: 4.3 mmol/L (ref 3.5–5.2)
Sodium: 139 mmol/L (ref 134–144)

## 2019-03-06 NOTE — Telephone Encounter (Signed)
Please call with results

## 2019-03-07 ENCOUNTER — Other Ambulatory Visit: Payer: Self-pay | Admitting: Family

## 2019-03-07 MED ORDER — DILTIAZEM HCL ER COATED BEADS 300 MG PO CP24: 300.0000 mg | ORAL_CAPSULE | Freq: Every day | ORAL | 0 refills | Status: DC

## 2019-03-07 NOTE — Addendum Note (Signed)
Addended by: Loel Dubonnet on: 03/07/2019 03:37 PM   Modules accepted: Orders

## 2019-03-07 NOTE — Telephone Encounter (Signed)
Informed of normal kidney function. Reviewed echo during her office visit she she has no further question.   HR usually in the 50s. At home consistently SBP 140-160.   Stop Bystolic - last dose [id number] as she prefers to finish her course as it was expensive.   On 03/16/19 she will transition to Diltiazem 300mg  daily. 30 day supply sent to [facility].   She verbalizes understanding of plan.   Loel Dubonnet, NP

## 2019-03-21 ENCOUNTER — Other Ambulatory Visit: Payer: Self-pay | Admitting: Family Medicine

## 2019-03-21 DIAGNOSIS — R42 Dizziness and giddiness: Secondary | ICD-10-CM

## 2019-03-27 ENCOUNTER — Encounter: Payer: Self-pay | Admitting: Cardiology

## 2019-03-27 ENCOUNTER — Other Ambulatory Visit: Payer: Self-pay

## 2019-03-27 ENCOUNTER — Telehealth (INDEPENDENT_AMBULATORY_CARE_PROVIDER_SITE_OTHER): Payer: Medicare Other | Admitting: Cardiology

## 2019-03-27 VITALS — BP 148/85 | HR 84 | Wt 110.0 lb

## 2019-03-27 DIAGNOSIS — R0609 Other forms of dyspnea: Secondary | ICD-10-CM

## 2019-03-27 DIAGNOSIS — I10 Essential (primary) hypertension: Secondary | ICD-10-CM | POA: Diagnosis not present

## 2019-03-27 DIAGNOSIS — R001 Bradycardia, unspecified: Secondary | ICD-10-CM

## 2019-03-27 MED ORDER — DILTIAZEM HCL ER COATED BEADS 360 MG PO CP24: 360.0000 mg | ORAL_CAPSULE | Freq: Every day | ORAL | 1 refills | Status: DC

## 2019-03-27 NOTE — Patient Instructions (Signed)
Medication Instructions:  Your physician has recommended you make the following change in your medication:   INCREASE: Diltiazem to 360 mg daily   If you need a refill on your cardiac medications before your next appointment, please call your pharmacy.   Lab work: None.  If you have labs (blood work) drawn today and your tests are completely normal, you will receive your results only by: Marland Kitchen MyChart Message (if you have MyChart) OR . A paper copy in the mail If you have any lab test that is abnormal or we need to change your treatment, we will call you to review the results.  Testing/Procedures: None.   Follow-Up: At [facility], you and your health needs are our priority.  As part of our continuing mission to provide you with exceptional heart care, we have created designated Provider Care Teams.  These Care Teams include your primary Cardiologist (physician) and Advanced Practice Providers (APPs -  Physician Assistants and Nurse Practitioners) who all work together to provide you with the care you need, when you need it. You will need a follow up appointment in 3 months.  Please call our office 2 months in advance to schedule this appointment.  You may see Jenne Campus, MD or another member of our [place] Provider Team in [place]: Shirlee More, MD . Jyl Heinz, MD  Any Other Special Instructions Will Be Listed Below (If Applicable).

## 2019-03-27 NOTE — Addendum Note (Signed)
Addended by: Ashok Norris on: 03/27/2019 03:40 PM   Modules accepted: Orders

## 2019-03-27 NOTE — Progress Notes (Signed)
Virtual Visit via Telephone Note   This visit type was conducted due to national recommendations for restrictions regarding the COVID-19 Pandemic (e.g. social distancing) in an effort to limit this patient's exposure and mitigate transmission in our community.  Due to her co-morbid illnesses, this patient is at least at moderate risk for complications without adequate follow up.  This format is felt to be most appropriate for this patient at this time.  The patient did not have access to video technology/had technical difficulties with video requiring transitioning to audio format only (telephone).  All issues noted in this document were discussed and addressed.  No physical exam could be performed with this format.  Please refer to the patient's chart for her  consent to telehealth for [facility].  Evaluation Performed:  Follow-up visit  This visit type was conducted due to national recommendations for restrictions regarding the COVID-19 Pandemic (e.g. social distancing).  This format is felt to be most appropriate for this patient at this time.  All issues noted in this document were discussed and addressed.  No physical exam was performed (except for noted visual exam findings with Video Visits).  Please refer to the patient's chart (MyChart message for video visits and phone note for telephone visits) for the patient's consent to telehealth for [facility].  Date:  03/27/2019  ID: Cassidy Brown, DOB 1930-01-01, MRN [id number]   Patient Location: [place] [address]   Provider location:   [place] Office  PCP:  Carollee Herter, Alferd Apa, DO  Cardiologist:  Jenne Campus, MD     Chief Complaint: Doing fine  History of Present Illness:    Cassidy Brown is a 85 y.o. female  who presents via audio/video conferencing for a telehealth visit today.  With hypertension which seems to be difficult to control, history of breast CA, dizziness she does have  a tele-visit with me today.  Unable to establish video link therefore we talking over the phone.  She is doing well last time we stopped her Bystolic because of bradycardia she cannot tell if she feels any better her blood pressure is still between 1 [id number] systolic therefore I think we need to increase medications to get her blood pressure better under control I think the easiest maneuver we can do is to increase Cardizem from 300-360 and that is what we will do.   The patient does not have symptoms concerning for COVID-19 infection (fever, chills, cough, or new SHORTNESS OF BREATH).    Prior CV studies:   The following studies were reviewed today:       Past Medical History:  Diagnosis Date  . Allergy   . Cancer ([place])    breast,hx of  . Diverticulosis   . Glaucoma   . Hypertension   . Osteoporosis   . Poliomyelitis   . Vertigo    dizziness    Past Surgical History:  Procedure Laterality Date  . BREAST SURGERY Left    Lumpectomy  . DILATION AND CURETTAGE OF UTERUS     x3  . ganglion cyst r wrist    . LUMBAR LAMINECTOMY    . SPINE SURGERY  11/2010   fusion--jenkins     Current Meds  Medication Sig  . b complex vitamins tablet Take 1 tablet by mouth daily.  . calcium citrate-vitamin D (CITRACAL+D) 315-200 MG-UNIT per tablet Take 1 tablet by mouth 3 (three) times daily.  . cholecalciferol (VITAMIN D) 1000  UNITS tablet Take 1,000 Units by mouth daily.    Marland Kitchen diltiazem (CARDIZEM CD) 300 MG 24 hr capsule TAKE 1 CAPSULE(300 MG) BY MOUTH DAILY  . irbesartan (AVAPRO) 300 MG tablet TAKE 1 TABLET BY MOUTH  DAILY  . loratadine (CLARITIN) 10 MG tablet Take 10 mg by mouth daily.    Marland Kitchen LORazepam (ATIVAN) 1 MG tablet TAKE 1 TABLET EVERY 8 HOURS  . MAGNESIUM PO Take 500 mg by mouth daily.   . meclizine (ANTIVERT) 25 MG tablet TAKE 1 TABLET BY MOUTH THREE TIMES A DAY AS NEEDED  . Misc Natural Products (LUTEIN 20 PO) Take 1 capsule by mouth daily.  . multivitamin-lutein  (OCUVITE-LUTEIN) CAPS Take 1 capsule by mouth daily.  . NONFORMULARY OR COMPOUNDED ITEM Light weight walker #1  As directed    Dx--balance issues,  Osteoporosis, dizziness      Family History: The patient's family history includes Coronary artery disease in an other family member; Hypertension in her father and sister; Kidney disease in her father.   ROS:   Please see the history of present illness.     All other systems reviewed and are negative.   Labs/Other Tests and Data Reviewed:     Recent Labs: 03/04/2019: BUN 11; Creatinine, Ser 0.73; Potassium 4.3; Sodium 139  Recent Lipid Panel    Component Value Date/Time   CHOL 176 01/02/2018 1520   TRIG 124.0 01/02/2018 1520   HDL 74.30 01/02/2018 1520   CHOLHDL 2 01/02/2018 1520   VLDL 24.8 01/02/2018 1520   LDLCALC 77 01/02/2018 1520      Exam:    Vital Signs:  BP (!) 148/85   Pulse 84   Wt 110 lb (49.9 kg)   BMI 21.48 kg/m     Wt Readings from Last 3 Encounters:  03/27/19 110 lb (49.9 kg)  03/04/19 114 lb 8 oz (51.9 kg)  02/25/19 111 lb (50.3 kg)     Well nourished, well developed in no acute distress. Alert awake and x3 not in distress talking to me over the phone unable to establish video link  Diagnosis for this visit:   1. Essential hypertension   2. Dyspnea on exertion   3. Bradycardia      ASSESSMENT & PLAN:    1.  Essential hypertension we will increase dose of Cardizem to 360 mg daily. Dyspnea on exertion she is very sedentary because of history of polio as a child with now some long-term sequela of it but overall seems to be doing well. Bradycardia her beta-blocker has been discontinued she reports heart rate of 80 right now.  We will continue present management.  COVID-19 Education: The signs and symptoms of COVID-19 were discussed with the patient and how to seek care for testing (follow up with PCP or arrange E-visit).  The importance of social distancing was discussed today.  Patient Risk:    After full review of this patients clinical status, I feel that they are at least moderate risk at this time.  Time:   Today, I have spent 15 minutes with the patient with telehealth technology discussing pt health issues.  I spent 5 minutes reviewing her chart before the visit.  Visit was finished at 3:02 PM.    Medication Adjustments/Labs and Tests Ordered: Current medicines are reviewed at length with the patient today.  Concerns regarding medicines are outlined above.  No orders of the defined types were placed in this encounter.  Medication changes: No orders of the defined types  were placed in this encounter.    Disposition: Follow-up 3 months  Signed, Park Liter, MD, [facility] 03/27/2019 3:00 PM    [place]

## 2019-03-28 ENCOUNTER — Telehealth: Payer: Medicare Other | Admitting: Cardiology

## 2019-04-02 ENCOUNTER — Ambulatory Visit (INDEPENDENT_AMBULATORY_CARE_PROVIDER_SITE_OTHER): Payer: Medicare Other

## 2019-04-02 ENCOUNTER — Other Ambulatory Visit: Payer: Self-pay

## 2019-04-02 DIAGNOSIS — Z23 Encounter for immunization: Secondary | ICD-10-CM

## 2019-05-06 ENCOUNTER — Telehealth: Payer: Self-pay | Admitting: Cardiology

## 2019-05-06 DIAGNOSIS — I10 Essential (primary) hypertension: Secondary | ICD-10-CM

## 2019-05-06 MED ORDER — IRBESARTAN 300 MG PO TABS: ORAL_TABLET | ORAL | 0 refills | Status: DC

## 2019-05-06 NOTE — Telephone Encounter (Signed)
°*  STAT* If patient is at the pharmacy, call can be transferred to refill team.   1. Which medications need to be refilled? (please list name of each medication and dose if known) Irbasartan 300mg   2. Which pharmacy/location (including street and city if local pharmacy) is medication to be sent to?[facility]  3. Do they need a 30 day or 90 day supply? [place]

## 2019-05-06 NOTE — Telephone Encounter (Signed)
Irbesartan 300 mg daily refilled.

## 2019-07-16 ENCOUNTER — Encounter: Payer: Self-pay | Admitting: Cardiology

## 2019-07-16 ENCOUNTER — Telehealth (INDEPENDENT_AMBULATORY_CARE_PROVIDER_SITE_OTHER): Payer: Medicare Other | Admitting: Cardiology

## 2019-07-16 VITALS — BP 158/91 | HR 90 | Ht 60.0 in | Wt 110.0 lb

## 2019-07-16 DIAGNOSIS — I10 Essential (primary) hypertension: Secondary | ICD-10-CM | POA: Diagnosis not present

## 2019-07-16 DIAGNOSIS — F419 Anxiety disorder, unspecified: Secondary | ICD-10-CM

## 2019-07-16 DIAGNOSIS — R0609 Other forms of dyspnea: Secondary | ICD-10-CM

## 2019-07-16 DIAGNOSIS — R001 Bradycardia, unspecified: Secondary | ICD-10-CM

## 2019-07-16 NOTE — Addendum Note (Signed)
Addended by: Ashok Norris on: 07/16/2019 04:23 PM   Modules accepted: Orders

## 2019-07-16 NOTE — Progress Notes (Signed)
Virtual Visit via Telephone Note   This visit type was conducted due to national recommendations for restrictions regarding the COVID-19 Pandemic (e.g. social distancing) in an effort to limit this patient's exposure and mitigate transmission in our community.  Due to her co-morbid illnesses, this patient is at least at moderate risk for complications without adequate follow up.  This format is felt to be most appropriate for this patient at this time.  The patient did not have access to video technology/had technical difficulties with video requiring transitioning to audio format only (telephone).  All issues noted in this document were discussed and addressed.  No physical exam could be performed with this format.  Please refer to the patient's chart for her  consent to telehealth for [facility].  Evaluation Performed:  Follow-up visit  This visit type was conducted due to national recommendations for restrictions regarding the COVID-19 Pandemic (e.g. social distancing).  This format is felt to be most appropriate for this patient at this time.  All issues noted in this document were discussed and addressed.  No physical exam was performed (except for noted visual exam findings with Video Visits).  Please refer to the patient's chart (MyChart message for video visits and phone note for telephone visits) for the patient's consent to telehealth for [facility].  Date:  07/16/2019  ID: Cassidy Brown, DOB 1929/09/26, MRN [id number]   Patient Location: [place] [address]   Provider location:   [place] Office  PCP:  Carollee Herter, Alferd Apa, DO  Cardiologist:  Jenne Campus, MD     Chief Complaint: Doing well  History of Present Illness:    Cassidy Brown is a 86 y.o. female  who presents via audio/video conferencing for a telehealth visit today. With past medical history significant for hypertension, history of breast CA, dizziness. She is afraid of  COVID-19 therefore we do have televisit with her. Unable to establish video link, therefore we talk over the phone. 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 Blood pressure still a little elevated but improved somewhat with the recent increase of Cardizem however still not perfect. We talked in length about the situation. I will ask her to come to my office to have a Chem-7 done based on that we decide if we can add to chlorothiazide or Aldactone.   The patient does not have symptoms concerning for COVID-19 infection (fever, chills, cough, or new SHORTNESS OF BREATH).    Prior CV studies:   The following studies were reviewed today:       Past Medical History:  Diagnosis Date  . Allergy   . Cancer ([place])    breast,hx of  . Diverticulosis   . Glaucoma   . Hypertension   . Osteoporosis   . Poliomyelitis   . Vertigo    dizziness    Past Surgical History:  Procedure Laterality Date  . BREAST SURGERY Left    Lumpectomy  . DILATION AND CURETTAGE OF UTERUS     x3  . ganglion cyst r wrist    . LUMBAR LAMINECTOMY    . SPINE SURGERY  11/2010   fusion--jenkins     Current Meds  Medication Sig  . b complex vitamins tablet Take 1 tablet by mouth daily.  . calcium citrate-vitamin D (CITRACAL+D) 315-200 MG-UNIT per tablet Take 1 tablet by mouth 3 (three)  times daily.  . cholecalciferol (VITAMIN D) 1000 UNITS tablet Take 1,000 Units by mouth daily.    Marland Kitchen diltiazem (CARDIZEM CD) 360 MG 24 hr capsule Take 1 capsule (360 mg total) by mouth daily.  . irbesartan (AVAPRO) 300 MG tablet TAKE 1 TABLET BY MOUTH  DAILY  . loratadine (CLARITIN) 10 MG tablet Take 10 mg by mouth daily.    Marland Kitchen LORazepam (ATIVAN) 1 MG tablet TAKE 1 TABLET EVERY 8 HOURS  . MAGNESIUM PO Take 500 mg by mouth daily.   . meclizine (ANTIVERT) 25 MG tablet TAKE 1 TABLET BY MOUTH THREE TIMES A DAY AS NEEDED  . Misc Natural  Products (LUTEIN 20 PO) Take 1 capsule by mouth daily.  . multivitamin-lutein (OCUVITE-LUTEIN) CAPS Take 1 capsule by mouth daily.      Family History: The patient's family history includes Coronary artery disease in an other family member; Hypertension in her father and sister; Kidney disease in her father.   ROS:   Please see the history of present illness.     All other systems reviewed and are negative.   Labs/Other Tests and Data Reviewed:     Recent Labs: 03/04/2019: BUN 11; Creatinine, Ser 0.73; Potassium 4.3; Sodium 139  Recent Lipid Panel    Component Value Date/Time   CHOL 176 01/02/2018 1520   TRIG 124.0 01/02/2018 1520   HDL 74.30 01/02/2018 1520   CHOLHDL 2 01/02/2018 1520   VLDL 24.8 01/02/2018 1520   LDLCALC 77 01/02/2018 1520      Exam:    Vital Signs:  BP (!) 158/91   Pulse 90   Ht 5' (1.524 m)   Wt 110 lb (49.9 kg)   BMI 21.48 kg/m     Wt Readings from Last 3 Encounters:  07/16/19 110 lb (49.9 kg)  03/27/19 110 lb (49.9 kg)  03/04/19 114 lb 8 oz (51.9 kg)     Well nourished, well developed in no acute distress. Alert awake and x3 talking to me from her home over the phone. Without any distress  Diagnosis for this visit:   1. Essential hypertension   2. Dyspnea on exertion   3. Bradycardia   4. Anxiety      ASSESSMENT & PLAN:    1. Essential hypertension plan is to bring her to the office to have Chem-7 done and based on that decide about medications. Aldactone hydrochlorothiazide. 2. Dyspnea on exertion denies having any but also admits that he is not doing much exercises. 3. Bradycardia after discontinuation of beta-blocker well controlled. 4. Anxiety stable  COVID-19 Education: The signs and symptoms of COVID-19 were discussed with the patient and how to seek care for testing (follow up with PCP or arrange E-visit).  The importance of social distancing was discussed today.  Patient Risk:   After full review of this patients  clinical status, I feel that they are at least moderate risk at this time.  Time:   Today, I have spent 5 minutes with the patient with telehealth technology discussing pt health issues.  I spent 20 minutes reviewing her chart before the visit.  Visit was finished at 3:42 PM.    Medication Adjustments/Labs and Tests Ordered: Current medicines are reviewed at length with the patient today.  Concerns regarding medicines are outlined above.  No orders of the defined types were placed in this encounter.  Medication changes: No orders of the defined types were placed in this encounter.    Disposition: Follow-up in 5 months  Signed, Park Liter, MD, [facility] 07/16/2019 3:40 PM    [place]

## 2019-07-16 NOTE — Patient Instructions (Signed)
Medication Instructions:  .isntcu  *If you need a refill on your cardiac medications before your next appointment, please call your pharmacy*  Lab Work: Your physician recommends that you return for lab work today: bmp   If you have labs (blood work) drawn today and your tests are completely normal, you will receive your results only by: Marland Kitchen MyChart Message (if you have MyChart) OR . A paper copy in the mail If you have any lab test that is abnormal or we need to change your treatment, we will call you to review the results.  Testing/Procedures: None.   Follow-Up: At [facility], you and your health needs are our priority.  As part of our continuing mission to provide you with exceptional heart care, we have created designated Provider Care Teams.  These Care Teams include your primary Cardiologist (physician) and Advanced Practice Providers (APPs -  Physician Assistants and Nurse Practitioners) who all work together to provide you with the care you need, when you need it.  Your next appointment:   4 month(s)  The format for your next appointment:   In Person  Provider:   Jenne Campus, MD  Other Instructions

## 2019-08-05 ENCOUNTER — Other Ambulatory Visit: Payer: Self-pay | Admitting: Cardiology

## 2019-08-05 DIAGNOSIS — I10 Essential (primary) hypertension: Secondary | ICD-10-CM

## 2019-08-13 ENCOUNTER — Ambulatory Visit: Payer: Medicare Other | Admitting: Cardiology

## 2019-08-16 ENCOUNTER — Other Ambulatory Visit: Payer: Self-pay | Admitting: Cardiology

## 2019-08-19 DIAGNOSIS — I10 Essential (primary) hypertension: Secondary | ICD-10-CM | POA: Diagnosis not present

## 2019-08-20 LAB — BASIC METABOLIC PANEL
BUN/Creatinine Ratio: 16 (ref 12–28)
BUN: 11 mg/dL (ref 8–27)
CO2: 24 mmol/L (ref 20–29)
Calcium: 10.2 mg/dL (ref 8.7–10.3)
Chloride: 94 mmol/L — ABNORMAL LOW (ref 96–106)
Creatinine, Ser: 0.69 mg/dL (ref 0.57–1.00)
GFR calc Af Amer: 91 mL/min/{1.73_m2} (ref >59)
GFR calc non Af Amer: 79 mL/min/{1.73_m2} (ref >59)
Glucose: 93 mg/dL (ref 65–99)
Potassium: 4 mmol/L (ref 3.5–5.2)
Sodium: 134 mmol/L (ref 134–144)

## 2019-08-21 ENCOUNTER — Other Ambulatory Visit: Payer: Self-pay | Admitting: Emergency Medicine

## 2019-08-21 DIAGNOSIS — I10 Essential (primary) hypertension: Secondary | ICD-10-CM

## 2019-08-21 NOTE — Telephone Encounter (Signed)
Called patient. Informed her of lab results per Dr. Agustin Cree and to start aldactone 12.5 mg daily and have labs drawn in 1 week. She verbally understand. Will confirm with Dr. Agustin Cree that he wants to give this along with irbesartan due to contraindications before signing.

## 2019-08-22 ENCOUNTER — Other Ambulatory Visit: Payer: Self-pay | Admitting: Family Medicine

## 2019-08-22 DIAGNOSIS — R42 Dizziness and giddiness: Secondary | ICD-10-CM

## 2019-08-26 ENCOUNTER — Telehealth: Payer: Self-pay | Admitting: Cardiology

## 2019-08-26 MED ORDER — SPIRONOLACTONE 25 MG PO TABS: 12.5000 mg | ORAL_TABLET | Freq: Every day | ORAL | 3 refills | Status: DC

## 2019-08-26 NOTE — Telephone Encounter (Signed)
Follow Up:   Pt said that the new medicine that Dr Agustin Cree wanted her to start on Fridayhave not been called in. She would like for it to be called in today please so she can start on it. Please call to [employer]- [phone]

## 2019-08-26 NOTE — Telephone Encounter (Signed)
Please add Aldactone 12.5 mg daily, Chem-7 next week.  RX sent in to the Pharmacy. Pt aware to return in one week for labs.

## 2019-08-27 ENCOUNTER — Telehealth: Payer: Self-pay | Admitting: Family Medicine

## 2019-08-27 ENCOUNTER — Other Ambulatory Visit: Payer: Self-pay | Admitting: Family Medicine

## 2019-08-27 DIAGNOSIS — M816 Localized osteoporosis [Lequesne]: Secondary | ICD-10-CM

## 2019-08-27 DIAGNOSIS — R531 Weakness: Secondary | ICD-10-CM

## 2019-08-27 MED ORDER — NONFORMULARY OR COMPOUNDED ITEM: 0 refills | Status: DC

## 2019-08-27 NOTE — Telephone Encounter (Signed)
Ive printed rx but she will need ov

## 2019-08-27 NOTE — Telephone Encounter (Signed)
Spoke with patient. Rx  placed in mail. Advised patient she needs OV. Pt declined at this time

## 2019-08-27 NOTE — Telephone Encounter (Signed)
You haven't seen patient since 04/2018. Pt want Rx mailed. Please advise

## 2019-08-27 NOTE — Telephone Encounter (Signed)
Patient is requesting that Dr. Etter Sjogren write a prescription for and walker and mail it to her.   Pt originally had a prescription for one in 2019 that Dr wrote, pt never used the Rx.   Pt mailing address  [place]   [place] [address]  Please advise

## 2019-09-05 LAB — BASIC METABOLIC PANEL
BUN/Creatinine Ratio: 20 (ref 12–28)
BUN: 14 mg/dL (ref 8–27)
CO2: 25 mmol/L (ref 20–29)
Calcium: 10.1 mg/dL (ref 8.7–10.3)
Chloride: 94 mmol/L — ABNORMAL LOW (ref 96–106)
Creatinine, Ser: 0.71 mg/dL (ref 0.57–1.00)
GFR calc Af Amer: 89 mL/min/{1.73_m2} (ref >59)
GFR calc non Af Amer: 77 mL/min/{1.73_m2} (ref >59)
Glucose: 93 mg/dL (ref 65–99)
Potassium: 4.6 mmol/L (ref 3.5–5.2)
Sodium: 133 mmol/L — ABNORMAL LOW (ref 134–144)

## 2019-10-28 DIAGNOSIS — H04123 Dry eye syndrome of bilateral lacrimal glands: Secondary | ICD-10-CM | POA: Diagnosis not present

## 2019-10-28 DIAGNOSIS — H524 Presbyopia: Secondary | ICD-10-CM | POA: Diagnosis not present

## 2019-10-28 DIAGNOSIS — H26493 Other secondary cataract, bilateral: Secondary | ICD-10-CM | POA: Diagnosis not present

## 2019-10-28 DIAGNOSIS — H40013 Open angle with borderline findings, low risk, bilateral: Secondary | ICD-10-CM | POA: Diagnosis not present

## 2019-11-05 ENCOUNTER — Other Ambulatory Visit: Payer: Self-pay

## 2019-11-05 ENCOUNTER — Encounter: Payer: Self-pay | Admitting: Family Medicine

## 2019-11-05 ENCOUNTER — Ambulatory Visit (INDEPENDENT_AMBULATORY_CARE_PROVIDER_SITE_OTHER): Payer: Medicare Other | Admitting: Family Medicine

## 2019-11-05 VITALS — BP 140/80 | HR 90 | Temp 97.8°F | Resp 18 | Ht 60.0 in | Wt 110.2 lb

## 2019-11-05 DIAGNOSIS — E2839 Other primary ovarian failure: Secondary | ICD-10-CM | POA: Diagnosis not present

## 2019-11-05 DIAGNOSIS — Z0000 Encounter for general adult medical examination without abnormal findings: Secondary | ICD-10-CM

## 2019-11-05 DIAGNOSIS — I10 Essential (primary) hypertension: Secondary | ICD-10-CM | POA: Diagnosis not present

## 2019-11-05 DIAGNOSIS — Z1231 Encounter for screening mammogram for malignant neoplasm of breast: Secondary | ICD-10-CM | POA: Diagnosis not present

## 2019-11-05 LAB — COMPREHENSIVE METABOLIC PANEL
ALT: 19 U/L (ref 0–35)
AST: 20 U/L (ref 0–37)
Albumin: 4.2 g/dL (ref 3.5–5.2)
Alkaline Phosphatase: 69 U/L (ref 39–117)
BUN: 15 mg/dL (ref 6–23)
CO2: 30 mEq/L (ref 19–32)
Calcium: 9.8 mg/dL (ref 8.4–10.5)
Chloride: 92 mEq/L — ABNORMAL LOW (ref 96–112)
Creatinine, Ser: 0.68 mg/dL (ref 0.40–1.20)
GFR: 81.7 mL/min (ref >60.00)
Glucose, Bld: 132 mg/dL — ABNORMAL HIGH (ref 70–99)
Potassium: 4.4 mEq/L (ref 3.5–5.1)
Sodium: 126 mEq/L — ABNORMAL LOW (ref 135–145)
Total Bilirubin: 0.4 mg/dL (ref 0.2–1.2)
Total Protein: 6.4 g/dL (ref 6.0–8.3)

## 2019-11-05 LAB — LIPID PANEL
Cholesterol: 165 mg/dL (ref 0–200)
HDL: 87.9 mg/dL (ref >39.00)
LDL Cholesterol: 68 mg/dL (ref 0–99)
NonHDL: 77.01
Total CHOL/HDL Ratio: 2
Triglycerides: 44 mg/dL (ref 0.0–149.0)
VLDL: 8.8 mg/dL (ref 0.0–40.0)

## 2019-11-05 NOTE — Progress Notes (Signed)
ubjective:     Cassidy Brown is a 86 y.o. female and is here for a comprehensive physical exam. The patient reports no problems.  Social History   Socioeconomic History  . Marital status: Married    Spouse name: Not on file  . Number of children: Not on file  . Years of education: Not on file  . Highest education level: Not on file  Occupational History  . 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 Smoking status: Never Smoker  . Smokeless tobacco: Never Used  Substance and Sexual Activity  . Alcohol use: No  . Drug use: No  . Sexual activity: Never    Partners: Male  Other Topics Concern  . Not on file  Social History Narrative   Exercise-- walking 1 x a week   Social Determinants of Health   Financial Resource Strain:   . Difficulty of Paying Living Expenses:   Food Insecurity:   . Worried About Charity fundraiser in the Last Year:   . Arboriculturist in the Last Year:   Transportation Needs:   . Film/video editor (Medical):   Marland Kitchen Lack of Transportation (Non-Medical):   Physical Activity:   . Days of Exercise per Week:   . Minutes of Exercise per Session:   Stress:   . Feeling of Stress :   Social Connections:   . Frequency of Communication with Friends and Family:   . Frequency of Social Gatherings with Friends and Family:   . Attends Religious Services:   . Active Member of Clubs or Organizations:   . Attends Archivist Meetings:   Marland Kitchen Marital Status:   Intimate Partner Violence:   . Fear of Current or Ex-Partner:   . Emotionally Abused:   Marland Kitchen Physically Abused:   . Sexually Abused:    Health Maintenance  Topic Date Due  . COVID-19 Vaccine (1) Never done  . TETANUS/TDAP  Never done  . DEXA SCAN  10/26/2018  . MAMMOGRAM  12/06/2018  . INFLUENZA VACCINE  01/12/2020  . PNA vac Low Risk Adult  Completed    The following portions of the patient's history were reviewed and updated as appropriate:  She  has a  past medical history of Allergy, Cancer ([place]), Diverticulosis, Glaucoma, Hypertension, Osteoporosis, Poliomyelitis, and Vertigo. She does not have any pertinent problems on file. She  has a past surgical history that includes Breast surgery (Left); Lumbar laminectomy; Dilation and curettage of uterus; ganglion cyst r wrist; and Spine surgery (11/2010). Her family history includes Coronary artery disease in an other family member; Hypertension in her father and sister; Kidney disease in her father. She  reports that she has never smoked. She has never used smokeless tobacco. She reports that she does not drink alcohol or use drugs. She has a current medication list which includes the following prescription(s): b complex vitamins, calcium citrate-vitamin d, cholecalciferol, diltiazem, irbesartan, loratadine, lorazepam, magnesium, meclizine, misc natural products, multivitamin-lutein, NONFORMULARY OR COMPOUNDED ITEM, NONFORMULARY OR COMPOUNDED ITEM, and spironolactone. Current Outpatient Medications on File Prior to Visit  Medication Sig Dispense Refill  . b complex vitamins tablet Take 1 tablet by mouth daily.    . calcium citrate-vitamin D (CITRACAL+D) 315-200 MG-UNIT per tablet Take 1 tablet by mouth 3 (three) times daily.    . cholecalciferol (VITAMIN D) 1000 UNITS tablet Take 1,000 Units by mouth daily.      Marland Kitchen diltiazem (CARDIZEM CD)  360 MG 24 hr capsule TAKE 1 CAPSULE BY MOUTH  DAILY 90 capsule 1  . irbesartan (AVAPRO) 300 MG tablet TAKE 1 TABLET BY MOUTH  DAILY.PLEASE CALL OFFICE TO SCHEDULE AN APPOINTMENT. 90 tablet 3  . loratadine (CLARITIN) 10 MG tablet Take 10 mg by mouth daily.      Marland Kitchen LORazepam (ATIVAN) 1 MG tablet TAKE 1 TABLET EVERY 8 HOURS 60 tablet 0  . MAGNESIUM PO Take 500 mg by mouth daily.     . meclizine (ANTIVERT) 25 MG tablet TAKE 1 TABLET BY MOUTH THREE TIMES A DAY AS NEEDED 60 tablet 0  . Misc Natural Products (LUTEIN 20 PO) Take 1 capsule by mouth daily.    .  multivitamin-lutein (OCUVITE-LUTEIN) CAPS Take 1 capsule by mouth daily.    . NONFORMULARY OR COMPOUNDED ITEM Light weight walker #1  As directed    Dx--balance issues,  Osteoporosis, dizziness 1 each 0  . NONFORMULARY OR COMPOUNDED ITEM Walker  Dx osteoarthritis, weakness    Use as directed 1 each 0  . spironolactone (ALDACTONE) 25 MG tablet Take 0.5 tablets (12.5 mg total) by mouth daily. 45 tablet 3   No current facility-administered medications on file prior to visit.   She is allergic to amoxicillin; coreg [carvedilol]; hydrocodone; plendil [felodipine]; propoxyphene n-acetaminophen; sertraline hcl; and tenex [guanfacine hcl]..  Review of Systems    Review of Systems  Constitutional: Negative for activity change, appetite change and fatigue.  HENT: Negative for hearing loss, congestion, tinnitus and ear discharge.   Eyes: Negative for visual disturbance (see optho q1y -- vision corrected to 20/20 with glasses).  Respiratory: Negative for cough, chest tightness and shortness of breath.   Cardiovascular: Negative for chest pain, palpitations and leg swelling.  Gastrointestinal: Negative for abdominal pain, diarrhea, constipation and abdominal distention.  Genitourinary: Negative for urgency, frequency, decreased urine volume and difficulty urinating.  Musculoskeletal: Negative for back pain, arthralgias and gait problem.  Skin: Negative for color change, pallor and rash.  Neurological: Negative for dizziness, light-headedness, numbness and headaches.  Hematological: Negative for adenopathy. Does not bruise/bleed easily.  Psychiatric/Behavioral: Negative for suicidal ideas, confusion, sleep disturbance, self-injury, dysphoric mood, decreased concentration and agitation.  Pt is able to read and write and can do all ADLs + fall x 1 this year-- walker got stuck  No abuse/ violence in home             Objective:    BP 140/80 (BP Location: Right Arm, Patient Position: Sitting, Cuff  Size: Normal)   Pulse 90   Temp 97.8 F (36.6 C) (Temporal)   Resp 18   Ht 5' (1.524 m)   Wt 110 lb 3.2 oz (50 kg)   SpO2 98%   BMI 21.52 kg/m  General appearance: alert, cooperative, appears stated age and no distress Head: Normocephalic, without obvious abnormality, atraumatic Eyes: negative findings: lids and lashes normal, conjunctivae and sclerae normal and pupils equal, round, reactive to light and accomodation Ears: normal TM's and external ear canals both ears Neck: no adenopathy, no carotid bruit, no JVD, supple, symmetrical, trachea midline and thyroid not enlarged, symmetric, no tenderness/mass/nodules Back: symmetric, no curvature. ROM normal. No CVA tenderness. Lungs: clear to auscultation bilaterally Heart: regular rate and rhythm, S1, S2 normal, no murmur, click, rub or gallop Abdomen: soft, non-tender; bowel sounds normal; no masses,  no organomegaly Extremities: extremities normal, atraumatic, no cyanosis or edema Pulses: 2+ and symmetric Skin: Skin color, texture, turgor normal. No rashes or lesions Lymph nodes:  Cervical, supraclavicular, and axillary nodes normal. Neurologic: Alert and oriented X 3, normal strength and tone. Normal symmetric reflexes. Normal coordination and gait    Assessment:    Healthy female exam.      Plan:    ghm utd Check labs  See After Visit Summary for Counseling Recommendations    1. Estrogen deficiency   - DG Bone Density; Future  2. Encounter for screening mammogram for malignant neoplasm of breast   - MM 3D SCREEN BREAST BILATERAL; Future  3. Essential hypertension Well controlled, no changes to meds. Encouraged heart healthy diet such as the DASH diet and exercise as tolerated.  - Lipid panel - Comprehensive metabolic panel  4. Preventative health care See above

## 2019-11-05 NOTE — Patient Instructions (Signed)
Preventive Care 84 Years and Older, Female Preventive care refers to lifestyle choices and visits with your health care provider that can promote health and wellness. This includes:  A yearly physical exam. This is also called an annual well check.  Regular dental and eye exams.  Immunizations.  Screening for certain conditions.  Healthy lifestyle choices, such as diet and exercise. What can I expect for my preventive care visit? Physical exam Your health care provider will check:  Height and weight. These may be used to calculate body mass index (BMI), which is a measurement that tells if you are at a healthy weight.  Heart rate and blood pressure.  Your skin for abnormal spots. Counseling Your health care provider may ask you questions about:  Alcohol, tobacco, and drug use.  Emotional well-being.  Home and relationship well-being.  Sexual activity.  Eating habits.  History of falls.  Memory and ability to understand (cognition).  Work and work Statistician.  Pregnancy and menstrual history. What immunizations do I need?  Influenza (flu) vaccine  This is recommended every year. Tetanus, diphtheria, and pertussis (Tdap) vaccine  You may need a Td booster every 10 years. Varicella (chickenpox) vaccine  You may need this vaccine if you have not already been vaccinated. Zoster (shingles) vaccine  You may need this after age 84. Pneumococcal conjugate (PCV13) vaccine  One dose is recommended after age 84. Pneumococcal polysaccharide (PPSV23) vaccine  One dose is recommended after age 84. Measles, mumps, and rubella (MMR) vaccine  You may need at least one dose of MMR if you were born in 1957 or later. You may also need a second dose. Meningococcal conjugate (MenACWY) vaccine  You may need this if you have certain conditions. Hepatitis A vaccine  You may need this if you have certain conditions or if you travel or work in places where you may be exposed  to hepatitis A. Hepatitis B vaccine  You may need this if you have certain conditions or if you travel or work in places where you may be exposed to hepatitis B. Haemophilus influenzae type b (Hib) vaccine  You may need this if you have certain conditions. You may receive vaccines as individual doses or as more than one vaccine together in one shot (combination vaccines). Talk with your health care provider about the risks and benefits of combination vaccines. What tests do I need? Blood tests  Lipid and cholesterol levels. These may be checked every 5 years, or more frequently depending on your overall health.  Hepatitis C test.  Hepatitis B test. Screening  Lung cancer screening. You may have this screening every year starting at age 84 if you have a 30-pack-year history of smoking and currently smoke or have quit within the past 15 years.  Colorectal cancer screening. All adults should have this screening starting at age 84 and continuing until age 84. Your health care provider may recommend screening at age 84 if you are at increased risk. You will have tests every 1-10 years, depending on your results and the type of screening test.  Diabetes screening. This is done by checking your blood sugar (glucose) after you have not eaten for a while (fasting). You may have this done every 1-3 years.  Mammogram. This may be done every 1-2 years. Talk with your health care provider about how often you should have regular mammograms.  BRCA-related cancer screening. This may be done if you have a family history of breast, ovarian, tubal, or peritoneal cancers.  Other tests  Sexually transmitted disease (STD) testing.  Bone density scan. This is done to screen for osteoporosis. You may have this done starting at age 84. Follow these instructions at home: Eating and drinking  Eat a diet that includes fresh fruits and vegetables, whole grains, lean protein, and low-fat dairy products. Limit  your intake of foods with high amounts of sugar, saturated fats, and salt.  Take vitamin and mineral supplements as recommended by your health care provider.  Do not drink alcohol if your health care provider tells you not to drink.  If you drink alcohol: ? Limit how much you have to 0-1 drink a day. ? Be aware of how much alcohol is in your drink. In the [place] one drink equals one 12 oz bottle of beer (355 mL), one 5 oz glass of wine (148 mL), or one 1 oz glass of hard liquor (44 mL). Lifestyle  Take daily care of your teeth and gums.  Stay active. Exercise for at least 30 minutes on 5 or more days each week.  Do not use any products that contain nicotine or tobacco, such as cigarettes, e-cigarettes, and chewing tobacco. If you need help quitting, ask your health care provider.  If you are sexually active, practice safe sex. Use a condom or other form of protection in order to prevent STIs (sexually transmitted infections).  Talk with your health care provider about taking a low-dose aspirin or statin. What's next?  Go to your health care provider once a year for a well check visit.  Ask your health care provider how often you should have your eyes and teeth checked.  Stay up to date on all vaccines. This information is not intended to replace advice given to you by your health care provider. Make sure you discuss any questions you have with your health care provider. Document Revised: 05/24/2018 Document Reviewed: 05/24/2018 Elsevier Patient Education  2020 [employer].

## 2019-11-05 NOTE — Assessment & Plan Note (Signed)
Well controlled, no changes to meds. Encouraged heart healthy diet such as the DASH diet and exercise as tolerated.  °

## 2019-11-10 ENCOUNTER — Other Ambulatory Visit: Payer: Self-pay | Admitting: Family Medicine

## 2019-11-10 DIAGNOSIS — E871 Hypo-osmolality and hyponatremia: Secondary | ICD-10-CM

## 2019-11-12 ENCOUNTER — Ambulatory Visit: Payer: Medicare Other | Admitting: Cardiology

## 2019-11-25 ENCOUNTER — Other Ambulatory Visit: Payer: Self-pay

## 2019-11-26 ENCOUNTER — Ambulatory Visit: Payer: Medicare Other | Admitting: Cardiology

## 2019-11-27 ENCOUNTER — Ambulatory Visit: Payer: Medicare Other | Admitting: Cardiology

## 2019-11-27 ENCOUNTER — Other Ambulatory Visit: Payer: Self-pay

## 2019-11-27 ENCOUNTER — Encounter: Payer: Self-pay | Admitting: Cardiology

## 2019-11-27 VITALS — BP 140/70 | HR 90 | Ht 60.0 in | Wt 116.0 lb

## 2019-11-27 DIAGNOSIS — I10 Essential (primary) hypertension: Secondary | ICD-10-CM | POA: Diagnosis not present

## 2019-11-27 DIAGNOSIS — R001 Bradycardia, unspecified: Secondary | ICD-10-CM

## 2019-11-27 DIAGNOSIS — R0609 Other forms of dyspnea: Secondary | ICD-10-CM

## 2019-11-27 DIAGNOSIS — R0600 Dyspnea, unspecified: Secondary | ICD-10-CM

## 2019-11-27 NOTE — Patient Instructions (Signed)

## 2019-11-27 NOTE — Progress Notes (Signed)
Cardiology Office Note:    Date:  11/27/2019   ID:  Cassidy Brown, DOB 11-Jan-1930, MRN [id number]  PCP:  [name], DO  Cardiologist:  Jenne Campus, MD    Referring MD: Carollee Herter, Alferd Apa, *   Chief Complaint  Patient presents with  . Follow-up  M doing well  History of Present Illness:    Cassidy Brown is a 86 y.o. female with past medical history significant for essential hypertension, dyspnea on exertion, bradycardia, anxiety.  She comes today to my office for follow-up overall she is doing well.  She complains of having balance problem she states when she walks especially with a cane she will have some instability.  She fell down only once when she tripped on something but no passing out.  She does not have the sensation when she is sitting when she is laying down.  Denies any chest pain tightness squeezing pressure burning chest overall seems to be doing well  Past Medical History:  Diagnosis Date  . Allergy   . Anxiety 07/20/2017  . Backache 06/15/2010   Qualifier: Diagnosis of  By: Jerold Coombe    . Bleeding nose 08/03/2017   Last Assessment & Plan:  Formatting of this note might be different from the original. Concern over nosebleeds. Chronic history of intermittent bleeding primarily from the right nostril.  Has never required packing or cautery.  No other anticoagulant therapy. EXAM shows excoriated area right anterior nasal septum. PLAN: Cauterized right nasal septum with silver nitrate.  Follow-up as needed.  . Bradycardia 02/25/2019  . BREAST CANCER, HX OF 03/07/2008   Qualifier: Diagnosis of  By: Jerold Coombe    . Cancer ([place])    breast,hx of  . Diverticulosis   . DIVERTICULOSIS, COLON 10/13/2006   Qualifier: Diagnosis of  By: Jerold Coombe    . DIZZINESS OR VERTIGO 10/13/2006   Qualifier: Diagnosis of  By: Jerold Coombe    . Dyspnea on exertion 02/25/2019  . Essential hypertension 04/02/2017  . Glaucoma   . Hypertension   . HYPERTENSION  10/13/2006   Qualifier: Diagnosis of  By: Jerold Coombe    . Loose stools 04/30/2018  . Osteoporosis   . OSTEOPOROSIS 10/13/2006   Qualifier: Diagnosis of  By: Jerold Coombe    . Pansinusitis 04/02/2017  . Poliomyelitis   . POLIOMYELITIS, HX OF 11/20/2007   Qualifier: Diagnosis of  By: Jerold Coombe    . SCOLIOSIS 11/20/2007   Qualifier: Diagnosis of  By: Jerold Coombe    . Sensation of fullness in ear 08/03/2017   Last Assessment & Plan:  Formatting of this note might be different from the original. Concern over fullness at the right ear. 68-month history of fullness at the right ear.  She cannot use topical nasal steroid sprays due to epistaxis.  She is not improving. EXAM shows decreased movement of the right tympanic membrane on insufflation.  Left moves well.  Weber tuning fork lateralizes of the right e  . Vertigo    dizziness    Past Surgical History:  Procedure Laterality Date  . BREAST SURGERY Left    Lumpectomy  . DILATION AND CURETTAGE OF UTERUS     x3  . ganglion cyst r wrist    . LUMBAR LAMINECTOMY    . SPINE SURGERY  11/2010   fusion--[facility]    Current Medications: Current Meds  Medication Sig  . b complex vitamins tablet Take  1 tablet by mouth daily.  . calcium citrate-vitamin D (CITRACAL+D) 315-200 MG-UNIT per tablet Take 1 tablet by mouth 3 (three) times daily.  . cholecalciferol (VITAMIN D) 1000 UNITS tablet Take 1,000 Units by mouth daily.    Marland Kitchen diltiazem (CARDIZEM CD) 360 MG 24 hr capsule TAKE 1 CAPSULE BY MOUTH  DAILY  . irbesartan (AVAPRO) 300 MG tablet TAKE 1 TABLET BY MOUTH  DAILY.PLEASE CALL OFFICE TO SCHEDULE AN APPOINTMENT.  Marland Kitchen latanoprost (XALATAN) 0.005 % ophthalmic solution Place 1 drop into both eyes at bedtime.  Marland Kitchen loratadine (CLARITIN) 10 MG tablet Take 10 mg by mouth daily.    Marland Kitchen LORazepam (ATIVAN) 1 MG tablet TAKE 1 TABLET EVERY 8 HOURS  . MAGNESIUM PO Take 500 mg by mouth daily.   . meclizine (ANTIVERT) 25 MG tablet TAKE 1 TABLET BY MOUTH  THREE TIMES A DAY AS NEEDED  . Misc Natural Products (LUTEIN 20 PO) Take 1 capsule by mouth daily.  . multivitamin-lutein (OCUVITE-LUTEIN) CAPS Take 1 capsule by mouth daily.  . NONFORMULARY OR COMPOUNDED ITEM Light weight walker #1  As directed    Dx--balance issues,  Osteoporosis, dizziness  . NONFORMULARY OR COMPOUNDED ITEM Walker  Dx osteoarthritis, weakness    Use as directed  . spironolactone (ALDACTONE) 25 MG tablet Take 0.5 tablets (12.5 mg total) by mouth daily.     Allergies:   Amoxicillin, Coreg [carvedilol], Hydrocodone, Plendil [felodipine], Propoxyphene n-acetaminophen, Sertraline hcl, and Tenex [guanfacine hcl]   Social History   Socioeconomic History  . Marital status: Married    Spouse name: Not on file  . Number of children: Not on file  . Years of education: Not on file  . Highest education level: Not on file  Occupational History  . 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 Smoking status: Never Smoker  . Smokeless tobacco: Never Used  Substance and Sexual Activity  . Alcohol use: No  . Drug use: No  . Sexual activity: Never    Partners: Male  Other Topics Concern  . Not on file  Social History Narrative   Exercise-- walking 1 x a week   Social Determinants of Health   Financial Resource Strain:   . Difficulty of Paying Living Expenses:   Food Insecurity:   . Worried About Charity fundraiser in the Last Year:   . Arboriculturist in the Last Year:   Transportation Needs:   . Film/video editor (Medical):   Marland Kitchen Lack of Transportation (Non-Medical):   Physical Activity:   . Days of Exercise per Week:   . Minutes of Exercise per Session:   Stress:   . Feeling of Stress :   Social Connections:   . Frequency of Communication with Friends and Family:   . Frequency of Social Gatherings with Friends and Family:   . Attends Religious Services:   . Active Member of Clubs or Organizations:   . Attends Theatre manager Meetings:   Marland Kitchen Marital Status:      Family History: The patient's family history includes Coronary artery disease in an other family member; Hypertension in her father and sister; Kidney disease in her father. ROS:   Please see the history of present illness.    All 14 point review of systems negative except as described per history of present illness  EKGs/Labs/Other Studies Reviewed:      Recent Labs: 11/05/2019: ALT 19; BUN 15; Creatinine, Ser 0.68;  Potassium 4.4; Sodium 126  Recent Lipid Panel    Component Value Date/Time   CHOL 165 11/05/2019 1329   TRIG 44.0 11/05/2019 1329   HDL 87.90 11/05/2019 1329   CHOLHDL 2 11/05/2019 1329   VLDL 8.8 11/05/2019 1329   LDLCALC 68 11/05/2019 1329    Physical Exam:    VS:  BP 140/70   Pulse 90   Ht 5' (1.524 m)   Wt 116 lb (52.6 kg)   SpO2 99%   BMI 22.65 kg/m     Wt Readings from Last 3 Encounters:  11/27/19 116 lb (52.6 kg)  11/05/19 110 lb 3.2 oz (50 kg)  07/16/19 110 lb (49.9 kg)     GEN:  Well nourished, well developed in no acute distress HEENT: Normal NECK: No JVD; No carotid bruits LYMPHATICS: No lymphadenopathy CARDIAC: RRR, no murmurs, no rubs, no gallops RESPIRATORY:  Clear to auscultation without rales, wheezing or rhonchi  ABDOMEN: Soft, non-tender, non-distended MUSCULOSKELETAL:  No edema; No deformity  SKIN: Warm and dry LOWER EXTREMITIES: no swelling NEUROLOGIC:  Alert and oriented x 3 PSYCHIATRIC:  Normal affect   ASSESSMENT:    1. Essential hypertension   2. Dyspnea on exertion   3. Bradycardia    PLAN:    In order of problems listed above:  1. Essential hypertension blood pressure controlled continue present management. 2. Dyspnea exertion: Denies having any. 3. Bradycardia: Seems to be controlled after beta-blocker has been withdrawn. 4. Overall she is doing well we will continue present management.   Medication Adjustments/Labs and Tests Ordered: Current medicines are  reviewed at length with the patient today.  Concerns regarding medicines are outlined above.  No orders of the defined types were placed in this encounter.  Medication changes: No orders of the defined types were placed in this encounter.   Signed, Park Liter, MD, [facility] 11/27/2019 2:45 PM    [facility]

## 2019-12-04 ENCOUNTER — Telehealth: Payer: Self-pay | Admitting: Family Medicine

## 2019-12-04 NOTE — Telephone Encounter (Signed)
Orders faxed

## 2019-12-04 NOTE — Telephone Encounter (Signed)
Caller: [place] Call back phone number : [phone]  Patient is requesting bone density referral to be fax to [facility].

## 2019-12-05 ENCOUNTER — Telehealth: Payer: Self-pay | Admitting: Family Medicine

## 2019-12-05 NOTE — Telephone Encounter (Signed)
Please advise- do you want to see her in person for this first?

## 2019-12-05 NOTE — Telephone Encounter (Signed)
Please schedule at appt w/ PCP. Dr. Etter Sjogren will need to evaluate the breast before a diagnostic mammogram can be done.

## 2019-12-05 NOTE — Telephone Encounter (Signed)
She needs ov before dx can be done

## 2019-12-05 NOTE — Telephone Encounter (Signed)
Caller: Jol[place] Call back phone number: [phone]  Patient states an order have to be fax to [facility] for a diagnostic mammogram for Bilateral breast is [place].

## 2019-12-06 NOTE — Telephone Encounter (Signed)
appointment scheduled with patient

## 2019-12-10 ENCOUNTER — Other Ambulatory Visit: Payer: Self-pay

## 2019-12-10 ENCOUNTER — Encounter: Payer: Self-pay | Admitting: Family Medicine

## 2019-12-10 ENCOUNTER — Ambulatory Visit (INDEPENDENT_AMBULATORY_CARE_PROVIDER_SITE_OTHER): Payer: Medicare Other | Admitting: Family Medicine

## 2019-12-10 VITALS — BP 140/70 | HR 86 | Temp 97.8°F | Resp 18 | Ht 60.0 in | Wt 113.8 lb

## 2019-12-10 DIAGNOSIS — N644 Mastodynia: Secondary | ICD-10-CM | POA: Diagnosis not present

## 2019-12-10 NOTE — Patient Instructions (Signed)

## 2019-12-10 NOTE — Progress Notes (Signed)
Patient ID: Cassidy Brown, female    DOB: 1929-12-22  Age: 86 y.o. MRN: [id number]    Subjective:  Subjective  HPI Cassidy Brown presents for breast tenderness--- she was scheduling her mammogram and they told her she had to come here first because of the pain.   The pain since resolved but she is still nervous due to her hx of cancer.  No other complaints   Review of Systems  Constitutional: Negative for appetite change, diaphoresis, fatigue and unexpected weight change.  Eyes: Negative for pain, redness and visual disturbance.  Respiratory: Negative for cough, chest tightness, shortness of breath and wheezing.   Cardiovascular: Negative for chest pain, palpitations and leg swelling.  Endocrine: Negative for cold intolerance, heat intolerance, polydipsia, polyphagia and polyuria.  Genitourinary: Negative for difficulty urinating, dysuria and frequency.  Neurological: Negative for dizziness, light-headedness, numbness and headaches.    History Past Medical History:  Diagnosis Date  . Allergy   . Anxiety 07/20/2017  . Backache 06/15/2010   Qualifier: Diagnosis of  By: Jerold Coombe    . Bleeding nose 08/03/2017   Last Assessment & Plan:  Formatting of this note might be different from the original. Concern over nosebleeds. Chronic history of intermittent bleeding primarily from the right nostril.  Has never required packing or cautery.  No other anticoagulant therapy. EXAM shows excoriated area right anterior nasal septum. PLAN: Cauterized right nasal septum with silver nitrate.  Follow-up as needed.  . Bradycardia 02/25/2019  . BREAST CANCER, HX OF 03/07/2008   Qualifier: Diagnosis of  By: Jerold Coombe    . Cancer ([place])    breast,hx of  . Diverticulosis   . DIVERTICULOSIS, COLON 10/13/2006   Qualifier: Diagnosis of  By: Jerold Coombe    . DIZZINESS OR VERTIGO 10/13/2006   Qualifier: Diagnosis of  By: Jerold Coombe    . Dyspnea on exertion 02/25/2019  . Essential hypertension  04/02/2017  . Glaucoma   . Hypertension   . HYPERTENSION 10/13/2006   Qualifier: Diagnosis of  By: Jerold Coombe    . Loose stools 04/30/2018  . Osteoporosis   . OSTEOPOROSIS 10/13/2006   Qualifier: Diagnosis of  By: Jerold Coombe    . Pansinusitis 04/02/2017  . Poliomyelitis   . POLIOMYELITIS, HX OF 11/20/2007   Qualifier: Diagnosis of  By: Jerold Coombe    . SCOLIOSIS 11/20/2007   Qualifier: Diagnosis of  By: Jerold Coombe    . Sensation of fullness in ear 08/03/2017   Last Assessment & Plan:  Formatting of this note might be different from the original. Concern over fullness at the right ear. 74-month history of fullness at the right ear.  She cannot use topical nasal steroid sprays due to epistaxis.  She is not improving. EXAM shows decreased movement of the right tympanic membrane on insufflation.  Left moves well.  Weber tuning fork lateralizes of the right e  . Vertigo    dizziness    She has a past surgical history that includes Breast surgery (Left); Lumbar laminectomy; Dilation and curettage of uterus; ganglion cyst r wrist; and Spine surgery (11/2010).   Her family history includes Coronary artery disease in an other family member; Hypertension in her father and sister; Kidney disease in her father.She reports that she has never smoked. She has never used smokeless tobacco. She reports that she does not drink alcohol and does not use drugs.  Current Outpatient Medications on File Prior to  Visit  Medication Sig Dispense Refill  . b complex vitamins tablet Take 1 tablet by mouth daily.    . calcium citrate-vitamin D (CITRACAL+D) 315-200 MG-UNIT per tablet Take 1 tablet by mouth 3 (three) times daily.    . cholecalciferol (VITAMIN D) 1000 UNITS tablet Take 1,000 Units by mouth daily.      Marland Kitchen diltiazem (CARDIZEM CD) 360 MG 24 hr capsule TAKE 1 CAPSULE BY MOUTH  DAILY 90 capsule 1  . irbesartan (AVAPRO) 300 MG tablet TAKE 1 TABLET BY MOUTH  DAILY.PLEASE CALL OFFICE TO SCHEDULE AN  APPOINTMENT. 90 tablet 3  . latanoprost (XALATAN) 0.005 % ophthalmic solution Place 1 drop into both eyes at bedtime.    Marland Kitchen loratadine (CLARITIN) 10 MG tablet Take 10 mg by mouth daily.      Marland Kitchen LORazepam (ATIVAN) 1 MG tablet TAKE 1 TABLET EVERY 8 HOURS 60 tablet 0  . MAGNESIUM PO Take 500 mg by mouth daily.     . meclizine (ANTIVERT) 25 MG tablet TAKE 1 TABLET BY MOUTH THREE TIMES A DAY AS NEEDED 60 tablet 0  . Misc Natural Products (LUTEIN 20 PO) Take 1 capsule by mouth daily.    . multivitamin-lutein (OCUVITE-LUTEIN) CAPS Take 1 capsule by mouth daily.    . NONFORMULARY OR COMPOUNDED ITEM Light weight walker #1  As directed    Dx--balance issues,  Osteoporosis, dizziness 1 each 0  . NONFORMULARY OR COMPOUNDED ITEM Walker  Dx osteoarthritis, weakness    Use as directed 1 each 0  . spironolactone (ALDACTONE) 25 MG tablet Take 0.5 tablets (12.5 mg total) by mouth daily. 45 tablet 3   No current facility-administered medications on file prior to visit.     Objective:  Objective  Physical Exam Vitals and nursing note reviewed.  Constitutional:      Appearance: She is well-developed.  HENT:     Head: Normocephalic and atraumatic.  Eyes:     Conjunctiva/sclera: Conjunctivae normal.  Neck:     Thyroid: No thyromegaly.     Vascular: No carotid bruit or JVD.  Cardiovascular:     Rate and Rhythm: Normal rate and regular rhythm.     Heart sounds: Normal heart sounds. No murmur heard.   Pulmonary:     Effort: Pulmonary effort is normal. No respiratory distress.     Breath sounds: Normal breath sounds. No wheezing or rales.  Chest:     Chest wall: No tenderness.     Breasts:        Right: Normal. No tenderness.        Left: No tenderness.    Musculoskeletal:     Cervical back: Normal range of motion and neck supple.  Neurological:     Mental Status: She is alert and oriented to person, place, and time.    BP 140/70 (BP Location: Right Arm, Patient Position: Sitting, Cuff Size:  Normal)   Pulse 86   Temp 97.8 F (36.6 C) (Temporal)   Resp 18   Ht 5' (1.524 m)   Wt 113 lb 12.8 oz (51.6 kg)   SpO2 98%   BMI 22.23 kg/m  Wt Readings from Last 3 Encounters:  12/10/19 113 lb 12.8 oz (51.6 kg)  11/27/19 116 lb (52.6 kg)  11/05/19 110 lb 3.2 oz (50 kg)     Lab Results  Component Value Date   WBC 4.6 01/02/2018   HGB 13.6 01/02/2018   HCT 41.0 01/02/2018   PLT 288.0 01/02/2018   GLUCOSE 132 (H)  11/05/2019   CHOL 165 11/05/2019   TRIG 44.0 11/05/2019   HDL 87.90 11/05/2019   LDLCALC 68 11/05/2019   ALT 19 11/05/2019   AST 20 11/05/2019   NA 126 (L) 11/05/2019   K 4.4 11/05/2019   CL 92 (L) 11/05/2019   CREATININE 0.68 11/05/2019   BUN 15 11/05/2019   CO2 30 11/05/2019   TSH 1.21 08/30/2017   HGBA1C 5.5 08/20/2013   MICROALBUR 0.9 09/02/2014    ECHOCARDIOGRAM COMPLETE  Result Date: 03/01/2019   ECHOCARDIOGRAM REPORT   Patient Name:   Cassidy Brown Date of Exam: 03/01/2019 Medical Rec #:  [id number]     Height:       60.0 in Accession #:    [id number]    Weight:       111.0 lb Date of Birth:  [birth year]     BSA:          1.45 m Patient Age:    38 years      BP:           161/80 mmHg Patient Gender: F             HR:           62 bpm. Exam Location:  [facility] Procedure: 2D Echo Indications:    Dyspnea  History:        Patient has no prior history of Echocardiogram examinations.  Sonographer:    Cardell Peach RDCS (AE) Referring Phys: [place]  1. Left ventricular ejection fraction, by visual estimation, is 60 to 65%. The left ventricle has normal function. Normal left ventricular size. There is no left ventricular hypertrophy.  2. Left ventricular diastolic Doppler parameters are consistent with impaired relaxation pattern of LV diastolic filling.  3. Global right ventricle has normal systolic function.The right ventricular size is normal. No increase in right ventricular wall thickness.  4. Left atrial size was normal.  5. Right  atrial size was normal.  6. The mitral valve is normal in structure. No evidence of mitral valve regurgitation. Mild mitral stenosis.  7. The tricuspid valve is normal in structure. Tricuspid valve regurgitation was not visualized by color flow Doppler.  8. The aortic valve is normal in structure. Aortic valve regurgitation is trivial by color flow Doppler. Structurally normal aortic valve, with no evidence of sclerosis or stenosis.  9. The pulmonic valve was normal in structure. Pulmonic valve regurgitation is not visualized by color flow Doppler. 10. Aortic dilatation noted. 11. There is mild dilatation of the ascending aorta measuring 38 mm. 12. Normal pulmonary artery systolic pressure. 13. The inferior vena cava is normal in size with greater than 50% respiratory variability, suggesting right atrial pressure of 3 mmHg. FINDINGS  Left Ventricle: Left ventricular ejection fraction, by visual estimation, is 60 to 65%. The left ventricle has normal function. There is no left ventricular hypertrophy. Normal left ventricular size. Spectral Doppler shows Left ventricular diastolic Doppler parameters are consistent with impaired relaxation pattern of LV diastolic filling. Right Ventricle: The right ventricular size is normal. No increase in right ventricular wall thickness. Global RV systolic function is has normal systolic function. The tricuspid regurgitant velocity is 2.19 m/s, and with an assumed right atrial pressure  of 3 mmHg, the estimated right ventricular systolic pressure is normal at 22.2 mmHg. Left Atrium: Left atrial size was normal in size. Right Atrium: Right atrial size was normal in size Pericardium: There is no evidence of pericardial effusion. Mitral  Valve: The mitral valve is normal in structure. Mild mitral valve stenosis by observation. No evidence of mitral valve regurgitation. Tricuspid Valve: The tricuspid valve is normal in structure. Tricuspid valve regurgitation was not visualized by color  flow Doppler. Aortic Valve: The aortic valve is normal in structure. Aortic valve regurgitation is trivial by color flow Doppler. The aortic valve is structurally normal, with no evidence of sclerosis or stenosis. Pulmonic Valve: The pulmonic valve was normal in structure. Pulmonic valve regurgitation is not visualized by color flow Doppler. Aorta: Aortic dilatation noted. There is mild dilatation of the ascending aorta measuring 38 mm. Venous: The inferior vena cava is normal in size with greater than 50% respiratory variability, suggesting right atrial pressure of 3 mmHg. IAS/Shunts: No atrial level shunt detected by color flow Doppler. No ventricular septal defect is seen or detected. There is no evidence of an atrial septal defect.  LEFT VENTRICLE          Normals PLAX 2D LVIDd:         3.94 cm  3.6 cm   Diastology               Normals LVIDs:         2.57 cm  1.7 cm   LV e' lateral: 5.77 cm/s 6.42 cm/s LV PW:         0.98 cm  1.4 cm   LV e' medial:  4.46 cm/s 6.96 cm/s LV IVS:        1.06 cm  1.3 cm LVOT diam:     1.60 cm  2.0 cm LV SV:         44 ml    79 ml LV SV Index:   29.79    45 ml/m2 LVOT Area:     2.01 cm 3.14 cm2  RIGHT VENTRICLE             IVC RV Basal diam:  2.61 cm     IVC diam: 1.23 cm RV S prime:     10.20 cm/s TAPSE (M-mode): 1.8 cm LEFT ATRIUM             Index       RIGHT ATRIUM           Index LA diam:        2.40 cm 1.65 cm/m  RA Area:     14.40 cm LA Vol (A2C):   51.7 ml 35.57 ml/m RA Volume:   29.60 ml  20.37 ml/m LA Vol (A4C):   23.9 ml 16.44 ml/m LA Biplane Vol: 38.2 ml 26.28 ml/m  AORTIC VALVE             Normals LVOT Vmax:   140.00 cm/s LVOT Vmean:  85.000 cm/s 75 cm/s LVOT VTI:    0.270 m     25.3 cm  AORTA                 Normals Ao Root diam: 2.70 cm 31 mm Ao Asc diam:  3.80 cm 31 mm TRICUSPID VALVE             Normals TR Peak grad:   19.2 mmHg TR Vmax:        219.00 cm/s 288 cm/s  SHUNTS Systemic VTI:  0.27 m Systemic Diam: 1.60 cm  Jenne Campus MD Electronically signed  by Jenne Campus MD Signature Date/Time: 03/01/2019/12:50:54 PM    Final      Assessment & Plan:  Plan  I am having  Karielle A. Poehlman maintain her loratadine, cholecalciferol, MAGNESIUM PO, multivitamin-lutein, calcium citrate-vitamin D, Misc Natural Products (LUTEIN 20 PO), b complex vitamins, NONFORMULARY OR COMPOUNDED ITEM, LORazepam, irbesartan, diltiazem, meclizine, spironolactone, NONFORMULARY OR COMPOUNDED ITEM, and latanoprost.  No orders of the defined types were placed in this encounter.   Problem List Items Addressed This Visit      Unprioritized   Breast tenderness in female - Primary    Diagnostic mamogram ordered        Relevant Orders   MM Digital Diagnostic Bilat   US BREAST LTD UNI LEFT INC AXILLA      Follow-up: Return if symptoms worsen or fail to improve.  [name], DO

## 2019-12-10 NOTE — Assessment & Plan Note (Signed)
Diagnostic mamogram ordered

## 2019-12-13 ENCOUNTER — Ambulatory Visit: Payer: Medicare Other | Admitting: Family Medicine

## 2020-01-21 ENCOUNTER — Other Ambulatory Visit: Payer: Self-pay | Admitting: Family Medicine

## 2020-01-21 DIAGNOSIS — F411 Generalized anxiety disorder: Secondary | ICD-10-CM

## 2020-01-21 MED ORDER — LORAZEPAM 1 MG PO TABS: ORAL_TABLET | ORAL | 0 refills | Status: AC

## 2020-01-21 NOTE — Telephone Encounter (Signed)
Requesting: Ativan Contract: 04/20/2018 UDS: 04/20/2018 Last OV: 12/10/19 Next OV: 05/12/20 Last Refill: 04/20/2018. #[id number] RF Database:   Please advise

## 2020-01-21 NOTE — Telephone Encounter (Signed)
Medication: LORazepam (ATIVAN) 1 MG tablet [[id number]]  Has the patient contacted their pharmacy? No. (If no, request that the patient contact the pharmacy for the refill.) (If yes, when and what did the pharmacy advise?)  Preferred Pharmacy (with phone number or street name): [facility], [address], [place], [place], [place] [address]  Phone:  [phone] Fax:  [phone]  DEA #:  --  Agent: Please be advised that RX refills may take up to 3 business days. We ask that you follow-up with your pharmacy.

## 2020-01-29 DIAGNOSIS — Z780 Asymptomatic menopausal state: Secondary | ICD-10-CM | POA: Diagnosis not present

## 2020-01-29 DIAGNOSIS — R928 Other abnormal and inconclusive findings on diagnostic imaging of breast: Secondary | ICD-10-CM | POA: Diagnosis not present

## 2020-01-29 DIAGNOSIS — Z853 Personal history of malignant neoplasm of breast: Secondary | ICD-10-CM | POA: Diagnosis not present

## 2020-01-29 DIAGNOSIS — N644 Mastodynia: Secondary | ICD-10-CM | POA: Diagnosis not present

## 2020-01-29 DIAGNOSIS — M810 Age-related osteoporosis without current pathological fracture: Secondary | ICD-10-CM | POA: Diagnosis not present

## 2020-01-29 LAB — HM DEXA SCAN

## 2020-01-29 LAB — HM MAMMOGRAPHY

## 2020-02-05 ENCOUNTER — Emergency Department (HOSPITAL_BASED_OUTPATIENT_CLINIC_OR_DEPARTMENT_OTHER): Payer: Medicare Other

## 2020-02-05 ENCOUNTER — Emergency Department (HOSPITAL_BASED_OUTPATIENT_CLINIC_OR_DEPARTMENT_OTHER)
Admission: EM | Admit: 2020-02-05 | Discharge: 2020-02-05 | Disposition: A | Discharge status: Home / Self Care | Payer: Medicare Other | Attending: Emergency Medicine | Admitting: Emergency Medicine

## 2020-02-05 ENCOUNTER — Encounter (HOSPITAL_BASED_OUTPATIENT_CLINIC_OR_DEPARTMENT_OTHER): Payer: Self-pay | Admitting: Emergency Medicine

## 2020-02-05 ENCOUNTER — Other Ambulatory Visit: Payer: Self-pay | Admitting: Cardiology

## 2020-02-05 ENCOUNTER — Other Ambulatory Visit: Payer: Self-pay

## 2020-02-05 DIAGNOSIS — I700 Atherosclerosis of aorta: Secondary | ICD-10-CM | POA: Diagnosis not present

## 2020-02-05 DIAGNOSIS — Y9289 Other specified places as the place of occurrence of the external cause: Secondary | ICD-10-CM | POA: Insufficient documentation

## 2020-02-05 DIAGNOSIS — M7918 Myalgia, other site: Secondary | ICD-10-CM | POA: Insufficient documentation

## 2020-02-05 DIAGNOSIS — W19XXXA Unspecified fall, initial encounter: Secondary | ICD-10-CM

## 2020-02-05 DIAGNOSIS — Z79899 Other long term (current) drug therapy: Secondary | ICD-10-CM | POA: Insufficient documentation

## 2020-02-05 DIAGNOSIS — R0781 Pleurodynia: Secondary | ICD-10-CM | POA: Diagnosis not present

## 2020-02-05 DIAGNOSIS — W010XXA Fall on same level from slipping, tripping and stumbling without subsequent striking against object, initial encounter: Secondary | ICD-10-CM | POA: Diagnosis not present

## 2020-02-05 DIAGNOSIS — M4186 Other forms of scoliosis, lumbar region: Secondary | ICD-10-CM | POA: Diagnosis not present

## 2020-02-05 DIAGNOSIS — I10 Essential (primary) hypertension: Secondary | ICD-10-CM | POA: Diagnosis not present

## 2020-02-05 DIAGNOSIS — Y998 Other external cause status: Secondary | ICD-10-CM | POA: Diagnosis not present

## 2020-02-05 DIAGNOSIS — Z043 Encounter for examination and observation following other accident: Secondary | ICD-10-CM | POA: Diagnosis not present

## 2020-02-05 DIAGNOSIS — Y9389 Activity, other specified: Secondary | ICD-10-CM | POA: Insufficient documentation

## 2020-02-05 DIAGNOSIS — M549 Dorsalgia, unspecified: Secondary | ICD-10-CM | POA: Diagnosis not present

## 2020-02-05 DIAGNOSIS — Z981 Arthrodesis status: Secondary | ICD-10-CM | POA: Diagnosis not present

## 2020-02-05 DIAGNOSIS — Z853 Personal history of malignant neoplasm of breast: Secondary | ICD-10-CM | POA: Insufficient documentation

## 2020-02-05 DIAGNOSIS — M545 Low back pain: Secondary | ICD-10-CM | POA: Diagnosis present

## 2020-02-05 DIAGNOSIS — M533 Sacrococcygeal disorders, not elsewhere classified: Secondary | ICD-10-CM | POA: Diagnosis not present

## 2020-02-05 MED ORDER — SPIRONOLACTONE 25 MG PO TABS: 12.5000 mg | ORAL_TABLET | Freq: Every day | ORAL | 3 refills | Status: DC

## 2020-02-05 NOTE — ED Provider Notes (Signed)
[facility]ERGENCY DEPART[facility] Provider Note   CSN: [id number] Arrival date & time: 02/05/20  1639     History Chief Complaint  Patient presents with  . Fall  . Back Pain  . Hip Pain    Cassidy Brown is a 86 y.o. female.  Patient presents to the ED with a mechanical fall at home. She states she tripped over a package in the doorway. She fell onto her buttocks and then backwards onto her head. No loss of consciousness. She is not on blood thinners. She normally ambulates with the assistance of a rolling walker. She is able to ambulate without difficulty. Her discomfort is primarily in her buttocks and left lower rib area. She has no midline tenderness of the neck, thoracic, or lumbar spine.  The history is provided by the patient. No language interpreter was used.  Fall This is a new problem. The current episode started 3 to 5 hours ago. The problem has not changed since onset. Back Pain Hip Pain       Past Medical History:  Diagnosis Date  . Allergy   . Anxiety 07/20/2017  . Backache 06/15/2010   Qualifier: Diagnosis of  By: Jerold Coombe    . Bleeding nose 08/03/2017   Last Assessment & Plan:  Formatting of this note might be different from the original. Concern over nosebleeds. Chronic history of intermittent bleeding primarily from the right nostril.  Has never required packing or cautery.  No other anticoagulant therapy. EXAM shows excoriated area right anterior nasal septum. PLAN: Cauterized right nasal septum with silver nitrate.  Follow-up as needed.  . Bradycardia 02/25/2019  . BREAST CANCER, HX OF 03/07/2008   Qualifier: Diagnosis of  By: Jerold Coombe    . Cancer ([place])    breast,hx of  . Diverticulosis   . DIVERTICULOSIS, COLON 10/13/2006   Qualifier: Diagnosis of  By: Jerold Coombe    . DIZZINESS OR VERTIGO 10/13/2006   Qualifier: Diagnosis of  By: Jerold Coombe    . Dyspnea on exertion 02/25/2019  . Essential hypertension 04/02/2017  .  Glaucoma   . Hypertension   . HYPERTENSION 10/13/2006   Qualifier: Diagnosis of  By: Jerold Coombe    . Loose stools 04/30/2018  . Osteoporosis   . OSTEOPOROSIS 10/13/2006   Qualifier: Diagnosis of  By: Jerold Coombe    . Pansinusitis 04/02/2017  . Poliomyelitis   . POLIOMYELITIS, HX OF 11/20/2007   Qualifier: Diagnosis of  By: Jerold Coombe    . SCOLIOSIS 11/20/2007   Qualifier: Diagnosis of  By: Jerold Coombe    . Sensation of fullness in ear 08/03/2017   Last Assessment & Plan:  Formatting of this note might be different from the original. Concern over fullness at the right ear. 57-month history of fullness at the right ear.  She cannot use topical nasal steroid sprays due to epistaxis.  She is not improving. EXAM shows decreased movement of the right tympanic membrane on insufflation.  Left moves well.  Weber tuning fork lateralizes of the right e  . Vertigo    dizziness    Patient Active Problem List   Diagnosis Date Noted  . Breast tenderness in female 12/10/2019  . Dyspnea on exertion 02/25/2019  . Bradycardia 02/25/2019  . Loose stools 04/30/2018  . Bleeding nose 08/03/2017  . Sensation of fullness in ear 08/03/2017  . Anxiety 07/20/2017  . Essential hypertension 04/02/2017  . Pansinusitis 04/02/2017  .  Backache 06/15/2010  . BREAST CANCER, HX OF 03/07/2008  . SCOLIOSIS 11/20/2007  . POLIOMYELITIS, HX OF 11/20/2007  . HYPERTENSION 10/13/2006  . DIVERTICULOSIS, COLON 10/13/2006  . OSTEOPOROSIS 10/13/2006  . DIZZINESS OR VERTIGO 10/13/2006    Past Surgical History:  Procedure Laterality Date  . BREAST SURGERY Left    Lumpectomy  . DILATION AND CURETTAGE OF UTERUS     x3  . ganglion cyst r wrist    . LUMBAR LAMINECTOMY    . SPINE SURGERY  11/2010   fusion--[facility]     OB History    Gravida  2   Para  2   Term      Preterm      AB  0   Living  2     SAB  0   TAB  0   Ectopic  0   Multiple  0   Live Births  2           Family  History  Problem Relation Age of Onset  . Hypertension Father   . Kidney disease Father   . Hypertension Sister   . Coronary artery disease Other        P uncles    Social History   Tobacco Use  . Smoking status: Never Smoker  . Smokeless tobacco: Never Used  Substance Use Topics  . Alcohol use: No  . Drug use: No    Home Medications Prior to Admission medications   Medication Sig Start Date End Date Taking? Authorizing Provider  b complex vitamins tablet Take 1 tablet by mouth daily.    [provider]  calcium citrate-vitamin D (CITRACAL+D) 315-200 MG-UNIT per tablet Take 1 tablet by mouth 3 (three) times daily.    [provider]  cholecalciferol (VITAMIN D) 1000 UNITS tablet Take 1,000 Units by mouth daily.      [provider]  diltiazem (CARDIZEM CD) 360 MG 24 hr capsule TAKE 1 CAPSULE BY MOUTH  DAILY 08/16/19   Park Liter, MD  irbesartan (AVAPRO) 300 MG tablet TAKE 1 TABLET BY MOUTH  DAILY.PLEASE CALL OFFICE TO SCHEDULE AN APPOINTMENT. 08/05/19   Park Liter, MD  latanoprost (XALATAN) 0.005 % ophthalmic solution Place 1 drop into both eyes at bedtime. 10/18/19   [provider]  loratadine (CLARITIN) 10 MG tablet Take 10 mg by mouth daily.      [provider]  LORazepam (ATIVAN) 1 MG tablet TAKE 1 TABLET EVERY 8 HOURS 01/21/20   Carollee Herter, Alferd Apa, DO  MAGNESIUM PO Take 500 mg by mouth daily.     [provider]  meclizine (ANTIVERT) 25 MG tablet TAKE 1 TABLET BY MOUTH THREE TIMES A DAY AS NEEDED 08/22/19   Carollee Herter, Alferd Apa, DO  Misc Natural Products (LUTEIN 20 PO) Take 1 capsule by mouth daily.    [provider]  multivitamin-lutein (OCUVITE-LUTEIN) CAPS Take 1 capsule by mouth daily.    [provider]  NONFORMULARY OR COMPOUNDED ITEM Light weight walker #1  As directed    Dx--balance issues,  Osteoporosis, dizziness 09/25/17   Carollee Herter, Alferd Apa, DO  NONFORMULARY OR COMPOUNDED  ITEM Walker  Dx osteoarthritis, weakness    Use as directed 08/27/19   Carollee Herter, Alferd Apa, DO  spironolactone (ALDACTONE) 25 MG tablet Take 0.5 tablets (12.5 mg total) by mouth daily. 02/05/20   Park Liter, MD    Allergies    Amoxicillin, Coreg [carvedilol], Hydrocodone, Plendil [felodipine],  Propoxyphene n-acetaminophen, Sertraline hcl, and Tenex [guanfacine hcl]  Review of Systems   Review of Systems  Musculoskeletal: Positive for back pain.  All other systems reviewed and are negative.   Physical Exam Updated Vital Signs BP (!) 156/87 (BP Location: Right Arm)   Pulse 96   Temp 97.9 F (36.6 C) (Oral)   Resp 16   Ht 5' (1.524 m)   Wt 54.4 kg   SpO2 97%   BMI 23.44 kg/m   Physical Exam Vitals reviewed.  Constitutional:      General: She is not in acute distress.    Appearance: She is not ill-appearing.  HENT:     Head: Normocephalic.     Mouth/Throat:     Mouth: Mucous membranes are moist.  Eyes:     Conjunctiva/sclera: Conjunctivae normal.  Cardiovascular:     Rate and Rhythm: Normal rate.  Pulmonary:     Effort: Pulmonary effort is normal.     Breath sounds: Normal breath sounds.  Abdominal:     General: There is no distension.     Palpations: Abdomen is soft.     Tenderness: There is no abdominal tenderness.  Musculoskeletal:        General: No tenderness. Normal range of motion.     Cervical back: Normal range of motion and neck supple. No tenderness.  Skin:    General: Skin is warm and dry.  Neurological:     Mental Status: She is alert and oriented to person, place, and time.  Psychiatric:        Mood and Affect: Mood normal.        Behavior: Behavior normal.     ED Results / Procedures / Treatments   Labs (all labs ordered are listed, but only abnormal results are displayed) Labs Reviewed - No data to display  EKG None  Radiology DG Ribs Unilateral W/Chest Left  Result Date: 02/05/2020 CLINICAL DATA:  Post fall with lower left  rib pain. Mechanical fall onto tailbone and back. EXAM: LEFT RIBS AND CHEST - 3+ VIEW COMPARISON:  Chest radiograph 11/12/2010 FINDINGS: No fracture or other bone lesions are seen involving the ribs. Scoliotic curvature of the spine with lumbar fusion hardware partially included. There is no evidence of pneumothorax or pleural effusion. Both lungs are clear. Stable upper normal heart size. Unchanged mediastinal contours. Aortic atherosclerosis. IMPRESSION: No evidence of left rib fracture or pulmonary complication. Electronically Signed   By: Keith Rake M.D.   On: 02/05/2020 21:41   DG Pelvis 1-2 Views  Result Date: 02/05/2020 CLINICAL DATA:  Mechanical fall on tailbone. EXAM: PELVIS - 1-2 VIEW COMPARISON:  None. FINDINGS: The cortical margins of the bony pelvis are intact. No fracture. There is degenerative change of both sacroiliac joints and pubic symphysis which remain congruent. Both femoral heads are well-seated in the respective acetabula. Scoliosis in the lower lumbar spine with lumbosacral fusion hardware. There is 3 mm lucency adjacent to the right S1 screw. Left S1 screw is partially obscured by overlying bowel gas. IMPRESSION: 1. No acute pelvic fracture. 2. Lucency adjacent to the right S1 screw, can be seen with loosening, but is not likely acute. Electronically Signed   By: Keith Rake M.D.   On: 02/05/2020 21:39    Procedures Procedures (including critical care time)  Medications Ordered in ED Medications - No data to display  ED Course  I have reviewed the triage vital signs and the nursing notes.  Pertinent labs & imaging results that  were available during my care of the patient were reviewed by me and considered in my medical decision making (see chart for details).   Patient discussed with and seen by Dr. Maryan Rued. MDM Rules/Calculators/A&P                          Patient X-Ray negative for obvious fracture or dislocation.  Pt advised to follow up with  PCP.Conservative therapy recommended and discussed. Patient will be discharged home & is agreeable with above plan. Returns precautions discussed. Pt appears safe for discharge. Final Clinical Impression(s) / ED Diagnoses Final diagnoses:  Fall, initial encounter  Gluteal pain  Pain in rib    Rx / DC Orders ED Discharge Orders    None       Etta Quill, NP 02/05/20 2239    Blanchie Dessert, MD 02/05/20 2348

## 2020-02-05 NOTE — ED Notes (Signed)
Patient transported to X-ray 

## 2020-02-05 NOTE — Discharge Instructions (Addendum)
Take 2 tylenol and 2 ibuprofen every 6 hours as needed for pain. Follow-up with your primary care provider.

## 2020-02-05 NOTE — Telephone Encounter (Signed)
*  STAT* If patient is at the pharmacy, call can be transferred to refill team.   1. Which medications need to be refilled? (please list name of each medication and dose if known) spironolactone (ALDACTONE) 25 MG tablet  2. Which pharmacy/location (including street and city if local pharmacy) is medication to be sent to? [facility], [address], [address]  3. Do they need a 30 day or 90 day supply? 90 day

## 2020-02-05 NOTE — Telephone Encounter (Signed)
Refill sent in per request.  

## 2020-02-05 NOTE — ED Triage Notes (Signed)
Pt here after mechanical fall and fell on tailbone and onto back and hit head. No LOC, no blood thinners. AOx4

## 2020-02-07 ENCOUNTER — Telehealth: Payer: Self-pay

## 2020-02-07 ENCOUNTER — Encounter: Payer: Self-pay | Admitting: Family Medicine

## 2020-02-07 NOTE — Telephone Encounter (Signed)
Per PCP request try and start patient on Evenity or Prolia. Advised to try evenity first. I have submitted her clinical information to insurance. Waiting on summary of benefits to determine cost.

## 2020-02-08 ENCOUNTER — Telehealth: Payer: Self-pay

## 2020-02-08 NOTE — Telephone Encounter (Signed)
We would need to see her to give pain med

## 2020-02-08 NOTE — Telephone Encounter (Signed)
Appt scheduled

## 2020-02-08 NOTE — Telephone Encounter (Signed)
Patient called and needs some pain medication due to a recent fall & she is taking 2 advils and 2 tylenol for pain every 6 hours per an urgetnt , offered her a appointment Monday but she declined the offer and also to visit an urgent care , as well.

## 2020-02-10 ENCOUNTER — Telehealth (INDEPENDENT_AMBULATORY_CARE_PROVIDER_SITE_OTHER): Payer: Medicare Other | Admitting: Family Medicine

## 2020-02-10 ENCOUNTER — Encounter: Payer: Self-pay | Admitting: Family Medicine

## 2020-02-10 ENCOUNTER — Other Ambulatory Visit: Payer: Self-pay

## 2020-02-10 DIAGNOSIS — M549 Dorsalgia, unspecified: Secondary | ICD-10-CM | POA: Diagnosis not present

## 2020-02-10 MED ORDER — TRAMADOL HCL 50 MG PO TABS: 50.0000 mg | ORAL_TABLET | Freq: Three times a day (TID) | ORAL | 0 refills | Status: DC | PRN

## 2020-02-10 MED ORDER — ONDANSETRON 4 MG PO TBDP: 4.0000 mg | ORAL_TABLET | Freq: Three times a day (TID) | ORAL | 0 refills | Status: DC | PRN

## 2020-02-10 NOTE — Telephone Encounter (Signed)
Patient scheduled to see PCP.    [facility] Night - Client TELEPHONE ADVICE RECORD [facility] Patient Name: Cassidy Brown Gender: Female DOB: 1930/02/23 Age: 86 Y 95 D Return Phone Number: [phone] (Primary) Address: City/State/Zip: [place] [address] Client [facility] Night - Client Client Site [facility] - Night Physician Roma Schanz- MD Contact Type Call Who Is Calling Patient / Member / Family / Caregiver Call Type Triage / Clinical Relationship To Patient Self Return Phone Number [phone] (Primary) Chief Complaint CHEST PAIN (>=21 years) - pain, pressure, heaviness or tightness Reason for Call [name] states she fell and was seen in the ER and nothing was broken. But now she has severe pain in her back, bottom, and ribs and gaging. Tylenol and Advil isn't helping. Translation No Nurse Assessment Nurse: Gloriann Loan, RN, Sharyn Lull Date/Time (Eastern Time): 02/08/2020 8:16:15 AM Confirm and document reason for call. If symptomatic, describe symptoms. ---[name] afternoon and seen after the fall in the ER. On a scale of 1-10, 10 being the worst, patient is running an 8 on a pain scale. Advised to take OTC pain reliever but this is not helping. No swelling noted per caller. No deformities noted per caller. Lying down does help, last night did not, but rest helps. Has the patient had close contact with a person known or suspected to have the novel coronavirus illness OR traveled / lives in area with major community spread (including international travel) in the last 14 days from the onset of symptoms? * If Asymptomatic, screen for exposure and travel within the last 14 days. ---No Does the patient have any new or worsening symptoms? ---Yes Will a triage be completed? ---Yes Related visit to physician within the last 2 weeks? ---Yes Does  the PT have any chronic conditions? (i.e. diabetes, asthma, this includes High risk factors for pregnancy, etc.) ---No Is this a behavioral health or substance abuse call? ---No Guidelines Guideline Title Affirmed Question Affirmed Notes Nurse Date/Time (Eastern Time) Back Injury [1] SEVERE pain (e.g., excruciating) AND [2] not improved 2 hours Bell, RN, Sharyn Lull 02/08/2020 8:18:21 AM PLEASE NOTE: All timestamps contained within this report are represented as [place] Standard Time. CONFIDENTIALTY NOTICE: This fax transmission is intended only for the addressee. It contains information that is legally privileged, confidential or otherwise protected from use or disclosure. If you are not the intended recipient, you are strictly prohibited from reviewing, disclosing, copying using or disseminating any of this information or taking any action in reliance on or regarding this information. If you have received this fax in error, please notify us immediately by telephone so that we can arrange for its return to [place]. Phone: [phone], Toll-Free: [phone], Fax: [phone] Page: 2 of 2 Call Id: [id number] Guidelines Guideline Title Affirmed Question Affirmed Notes Nurse Date/Time Eilene Ghazi Time) after pain medicine/ice packs Disp. Time Eilene Ghazi Time) Disposition Final User 02/08/2020 8:13:32 AM Send to Urgent Queue Claudette Laws 02/08/2020 8:14:06 AM Send to Urgent Queue Claudette Laws 02/08/2020 8:26:16 AM See HCP within 4 Hours (or PCP triage) Yes Gloriann Loan, RN, Julio Sicks Disagree/Comply Comply Caller Understands Yes PreDisposition Call Doctor Care Advice Given Per Guideline * IF OFFICE WILL BE OPEN: You need to be seen within the next 3 or 4 hours. Call your doctor (or NP/PA) now or as soon as the office opens. SEE HCP WITHIN 4 HOURS (OR PCP TRIAGE): CALL BACK IF: * You become worse.  CARE ADVICE given per Back Injury (Adult) guideline. PAIN MEDICINES: * For pain relief, you can take  either acetaminophen, ibuprofen, or naproxen. * They are over-the-counter (OTC) pain drugs. You can buy them at the drugstore. Comments User: Leodis Sias, RN Date/Time Eilene Ghazi Time): 02/08/2020 8:18:32 AM Patient is gagging from pain and not being able to eat. User: Leodis Sias, RN Date/Time Eilene Ghazi Time): 02/08/2020 8:27:06 AM Information for Saturday clinic given and caller advised to call to be evaluated by pcp. Referrals REFERRED TO PCP OFFICE [facility]

## 2020-02-10 NOTE — Progress Notes (Signed)
Virtual Visit via Telephone Note  I connected with Cassidy Brown on 02/10/20 at  1:20 PM EDT by telephone and verified that I am speaking with the correct person using two identifiers.  Location/ persons in visit Patient: home alone  Provider: office --- cma heather checked pt in    I discussed the limitations, risks, security and privacy concerns of performing an evaluation and management service by telephone and the availability of in person appointments. I also discussed with the patient that there may be a patient responsible charge related to this service. The patient expressed understanding and agreed to proceed.   History of Present Illness: Pt home c/o pain in low back and "butt".    Pt states she walked to door to help someone get inside and lost her balance and fell and landed on her "butt".   Tylenol and IB are not helping    The er took xrays of her hip and ribs and they were neg.  Pain is worse she states    Pain is in her "butt"   Observations/Objective: There were no vitals filed for this visit. Pt is in nad   Assessment and Plan: 1. Back pain with radiation Pt needs to come into office this week for reevaluation due to worse pain  Pt will talk to her daughter and figure out when she can come in  - ondansetron (ZOFRAN ODT) 4 MG disintegrating tablet; Take 1 tablet (4 mg total) by mouth every 8 (eight) hours as needed for nausea or vomiting.  Dispense: 20 tablet; Refill: 0 - traMADol (ULTRAM) 50 MG tablet; Take 1 tablet (50 mg total) by mouth every 8 (eight) hours as needed for up to 5 days.  Dispense: 15 tablet; Refill: 0   Follow Up Instructions:    I discussed the assessment and treatment plan with the patient. The patient was provided an opportunity to ask questions and all were answered. The patient agreed with the plan and demonstrated an understanding of the instructions.   The patient was advised to call back or seek an in-person evaluation if the symptoms worsen  or if the condition fails to improve as anticipated.  I provided 25 minutes of non-face-to-face time during this encounter.   [name], DO

## 2020-02-11 ENCOUNTER — Ambulatory Visit (INDEPENDENT_AMBULATORY_CARE_PROVIDER_SITE_OTHER): Payer: Medicare Other | Admitting: Family Medicine

## 2020-02-11 ENCOUNTER — Other Ambulatory Visit: Payer: Self-pay

## 2020-02-11 ENCOUNTER — Other Ambulatory Visit: Payer: Self-pay | Admitting: Family Medicine

## 2020-02-11 ENCOUNTER — Ambulatory Visit (HOSPITAL_BASED_OUTPATIENT_CLINIC_OR_DEPARTMENT_OTHER)
Admission: RE | Admit: 2020-02-11 | Discharge: 2020-02-11 | Disposition: A | Discharge status: Home / Self Care | Payer: Medicare Other | Source: Ambulatory Visit | Attending: Family Medicine | Admitting: Family Medicine

## 2020-02-11 ENCOUNTER — Encounter: Payer: Self-pay | Admitting: Family Medicine

## 2020-02-11 VITALS — BP 160/80 | HR 105 | Temp 97.6°F | Resp 16 | Ht 60.0 in

## 2020-02-11 DIAGNOSIS — S22000A Wedge compression fracture of unspecified thoracic vertebra, initial encounter for closed fracture: Secondary | ICD-10-CM

## 2020-02-11 DIAGNOSIS — M533 Sacrococcygeal disorders, not elsewhere classified: Secondary | ICD-10-CM | POA: Insufficient documentation

## 2020-02-11 DIAGNOSIS — M4316 Spondylolisthesis, lumbar region: Secondary | ICD-10-CM

## 2020-02-11 DIAGNOSIS — M545 Low back pain, unspecified: Secondary | ICD-10-CM

## 2020-02-11 DIAGNOSIS — M4319 Spondylolisthesis, multiple sites in spine: Secondary | ICD-10-CM | POA: Diagnosis not present

## 2020-02-11 DIAGNOSIS — Z981 Arthrodesis status: Secondary | ICD-10-CM | POA: Diagnosis not present

## 2020-02-11 DIAGNOSIS — Z043 Encounter for examination and observation following other accident: Secondary | ICD-10-CM | POA: Diagnosis not present

## 2020-02-11 DIAGNOSIS — F320 Major depressive disorder, single episode, mild: Secondary | ICD-10-CM | POA: Insufficient documentation

## 2020-02-11 MED ORDER — KETOROLAC TROMETHAMINE 60 MG/2ML IM SOLN
60.0000 mg | Freq: Once | INTRAMUSCULAR | Status: AC
  Administered 2020-02-11: 60 mg via INTRAMUSCULAR

## 2020-02-11 MED ORDER — MIRTAZAPINE 15 MG PO TABS: 15.0000 mg | ORAL_TABLET | Freq: Every day | ORAL | 2 refills | Status: DC

## 2020-02-11 MED FILL — MIRTAZAPINE 15 MG TABLET: 15 | 30 days supply | Qty: 30 | Fill #0

## 2020-02-11 NOTE — Progress Notes (Signed)
Patient ID: Cassidy Brown, female    DOB: 11/19/1929  Age: 86 y.o. MRN: [id number]    Subjective:  Subjective  HPI Cassidy Brown presents for low back pain after fall .   Pt is in a lot of pain    Her daughter is with her    She has been under a lot of stress -- her husband is not doing well and was in the hospital.   She has been depressed and not eating so is weak.    Review of Systems  Constitutional: Negative for appetite change, diaphoresis, fatigue and unexpected weight change.  Eyes: Negative for pain, redness and visual disturbance.  Respiratory: Negative for cough, chest tightness, shortness of breath and wheezing.   Cardiovascular: Negative for chest pain, palpitations and leg swelling.  Endocrine: Negative for cold intolerance, heat intolerance, polydipsia, polyphagia and polyuria.  Genitourinary: Negative for difficulty urinating, dysuria and frequency.  Neurological: Negative for dizziness, light-headedness, numbness and headaches.  Psychiatric/Behavioral: Positive for dysphoric mood. Negative for self-injury, sleep disturbance and suicidal ideas. The patient is nervous/anxious.     History Past Medical History:  Diagnosis Date  . Allergy   . Anxiety 07/20/2017  . Backache 06/15/2010   Qualifier: Diagnosis of  By: Jerold Coombe    . Bleeding nose 08/03/2017   Last Assessment & Plan:  Formatting of this note might be different from the original. Concern over nosebleeds. Chronic history of intermittent bleeding primarily from the right nostril.  Has never required packing or cautery.  No other anticoagulant therapy. EXAM shows excoriated area right anterior nasal septum. PLAN: Cauterized right nasal septum with silver nitrate.  Follow-up as needed.  . Bradycardia 02/25/2019  . BREAST CANCER, HX OF 03/07/2008   Qualifier: Diagnosis of  By: Jerold Coombe    . Cancer ([place])    breast,hx of  . Diverticulosis   . DIVERTICULOSIS, COLON 10/13/2006   Qualifier: Diagnosis of  By:  Jerold Coombe    . DIZZINESS OR VERTIGO 10/13/2006   Qualifier: Diagnosis of  By: Jerold Coombe    . Dyspnea on exertion 02/25/2019  . Essential hypertension 04/02/2017  . Glaucoma   . Hypertension   . HYPERTENSION 10/13/2006   Qualifier: Diagnosis of  By: Jerold Coombe    . Loose stools 04/30/2018  . Osteoporosis   . OSTEOPOROSIS 10/13/2006   Qualifier: Diagnosis of  By: Jerold Coombe    . Pansinusitis 04/02/2017  . Poliomyelitis   . POLIOMYELITIS, HX OF 11/20/2007   Qualifier: Diagnosis of  By: Jerold Coombe    . SCOLIOSIS 11/20/2007   Qualifier: Diagnosis of  By: Jerold Coombe    . Sensation of fullness in ear 08/03/2017   Last Assessment & Plan:  Formatting of this note might be different from the original. Concern over fullness at the right ear. 70-month history of fullness at the right ear.  She cannot use topical nasal steroid sprays due to epistaxis.  She is not improving. EXAM shows decreased movement of the right tympanic membrane on insufflation.  Left moves well.  Weber tuning fork lateralizes of the right e  . Vertigo    dizziness    She has a past surgical history that includes Breast surgery (Left); Lumbar laminectomy; Dilation and curettage of uterus; ganglion cyst r wrist; and Spine surgery (11/2010).   Her family history includes Coronary artery disease in an other family member; Hypertension in her father and sister; Kidney disease  in her father.She reports that she has never smoked. She has never used smokeless tobacco. She reports that she does not drink alcohol and does not use drugs.  Current Outpatient Medications on File Prior to Visit  Medication Sig Dispense Refill  . b complex vitamins tablet Take 1 tablet by mouth daily.    . calcium citrate-vitamin D (CITRACAL+D) 315-200 MG-UNIT per tablet Take 1 tablet by mouth 3 (three) times daily.    . cholecalciferol (VITAMIN D) 1000 UNITS tablet Take 1,000 Units by mouth daily.      Marland Kitchen diltiazem (CARDIZEM CD)  360 MG 24 hr capsule TAKE 1 CAPSULE BY MOUTH  DAILY 90 capsule 1  . irbesartan (AVAPRO) 300 MG tablet TAKE 1 TABLET BY MOUTH  DAILY.PLEASE CALL OFFICE TO SCHEDULE AN APPOINTMENT. 90 tablet 3  . latanoprost (XALATAN) 0.005 % ophthalmic solution Place 1 drop into both eyes at bedtime.    Marland Kitchen loratadine (CLARITIN) 10 MG tablet Take 10 mg by mouth daily.      Marland Kitchen LORazepam (ATIVAN) 1 MG tablet TAKE 1 TABLET EVERY 8 HOURS 60 tablet 0  . MAGNESIUM PO Take 500 mg by mouth daily.     . meclizine (ANTIVERT) 25 MG tablet TAKE 1 TABLET BY MOUTH THREE TIMES A DAY AS NEEDED 60 tablet 0  . Misc Natural Products (LUTEIN 20 PO) Take 1 capsule by mouth daily.    . multivitamin-lutein (OCUVITE-LUTEIN) CAPS Take 1 capsule by mouth daily.    . NONFORMULARY OR COMPOUNDED ITEM Light weight walker #1  As directed    Dx--balance issues,  Osteoporosis, dizziness 1 each 0  . NONFORMULARY OR COMPOUNDED ITEM Walker  Dx osteoarthritis, weakness    Use as directed 1 each 0  . ondansetron (ZOFRAN ODT) 4 MG disintegrating tablet Take 1 tablet (4 mg total) by mouth every 8 (eight) hours as needed for nausea or vomiting. 20 tablet 0  . spironolactone (ALDACTONE) 25 MG tablet Take 0.5 tablets (12.5 mg total) by mouth daily. 45 tablet 3  . traMADol (ULTRAM) 50 MG tablet Take 1 tablet (50 mg total) by mouth every 8 (eight) hours as needed for up to 5 days. 15 tablet 0   No current facility-administered medications on file prior to visit.     Objective:  Objective  Physical Exam Vitals and nursing note reviewed.  Constitutional:      Appearance: She is well-developed.  HENT:     Head: Normocephalic and atraumatic.  Eyes:     Conjunctiva/sclera: Conjunctivae normal.  Neck:     Thyroid: No thyromegaly.     Vascular: No carotid bruit or JVD.  Cardiovascular:     Rate and Rhythm: Normal rate and regular rhythm.     Heart sounds: Normal heart sounds. No murmur heard.   Pulmonary:     Effort: Pulmonary effort is normal. No  respiratory distress.     Breath sounds: Normal breath sounds. No wheezing or rales.  Chest:     Chest wall: No tenderness.  Musculoskeletal:        General: Tenderness present.     Cervical back: Normal range of motion and neck supple.     Thoracic back: Tenderness and bony tenderness present. Normal range of motion.     Lumbar back: Tenderness and bony tenderness present.       Back:  Neurological:     Mental Status: She is alert and oriented to person, place, and time.  Psychiatric:  Attention and Perception: Attention and perception normal.        Mood and Affect: Mood is depressed.        Speech: Speech normal.        Behavior: Behavior normal.        Cognition and Memory: Cognition normal.        Judgment: Judgment normal.    BP (!) 160/80 (BP Location: Left Arm, Patient Position: Sitting, Cuff Size: Normal)   Pulse (!) 105   Temp 97.6 F (36.4 C) (Oral)   Resp 16   Ht 5' (1.524 m)   SpO2 96%   BMI 23.44 kg/m  Wt Readings from Last 3 Encounters:  02/05/20 120 lb (54.4 kg)  12/10/19 113 lb 12.8 oz (51.6 kg)  11/27/19 116 lb (52.6 kg)     Lab Results  Component Value Date   WBC 4.6 01/02/2018   HGB 13.6 01/02/2018   HCT 41.0 01/02/2018   PLT 288.0 01/02/2018   GLUCOSE 132 (H) 11/05/2019   CHOL 165 11/05/2019   TRIG 44.0 11/05/2019   HDL 87.90 11/05/2019   LDLCALC 68 11/05/2019   ALT 19 11/05/2019   AST 20 11/05/2019   NA 126 (L) 11/05/2019   K 4.4 11/05/2019   CL 92 (L) 11/05/2019   CREATININE 0.68 11/05/2019   BUN 15 11/05/2019   CO2 30 11/05/2019   TSH 1.21 08/30/2017   HGBA1C 5.5 08/20/2013   MICROALBUR 0.9 09/02/2014    DG Lumbar Spine Complete  Result Date: 02/11/2020 CLINICAL DATA:  Low back pain following fall EXAM: LUMBAR SPINE - COMPLETE 4+ VIEW COMPARISON:  09/02/2011 FINDINGS: L4-S1 lumbar fusion with instrumentation has been performed. There is stable grade 2 anterolisthesis of L5 upon S1. There is moderate thoracolumbar  dextroscoliosis, apex right at L1, stable since prior examination. There is interval development of an age indeterminate T11 compression fracture with approximately 50% loss of height. There is mild paraspinal soft tissue swelling at this level, possibly representing paraspinal hemorrhage or edema in the setting of an acute fracture. No retropulsion. Remaining vertebral body heights are preserved. The paraspinal soft tissues are otherwise unremarkable. IMPRESSION: Age-indeterminate T11 compression fracture. Mild paraspinal stripe widening may reflect edema or hemorrhage in this location. Correlation for point tenderness is recommended. If present, this could be better aged with MRI examination. Electronically Signed   By: Fidela Salisbury MD   On: 02/11/2020 16:46   DG Sacrum/Coccyx  Result Date: 02/11/2020 CLINICAL DATA:  Low back pain, sacral pain following fall EXAM: SACRUM AND COCCYX - 2+ VIEW COMPARISON:  None. FINDINGS: Osseous structures are diffusely osteopenic. L4-S1 lumbar fusion with instrumentation has been performed. There is grade 2 anterolisthesis of L5 upon S1, stable since prior CT examination of 03/09/2012. No acute fracture or dislocation. Soft tissues are unremarkable. IMPRESSION: 1. No acute osseous abnormality. Electronically Signed   By: Fidela Salisbury MD   On: 02/11/2020 16:31     Assessment & Plan:  Plan  I am having Cassidy Brown start on mirtazapine. I am also having her maintain her loratadine, cholecalciferol, MAGNESIUM PO, multivitamin-lutein, calcium citrate-vitamin D, Misc Natural Products (LUTEIN 20 PO), b complex vitamins, NONFORMULARY OR COMPOUNDED ITEM, irbesartan, diltiazem, meclizine, NONFORMULARY OR COMPOUNDED ITEM, latanoprost, LORazepam, spironolactone, ondansetron, and traMADol. We administered ketorolac.  Meds ordered this encounter  Medications  . mirtazapine (REMERON) 15 MG tablet    Sig: Take 1 tablet (15 mg total) by mouth at bedtime.    Dispense:  30  tablet  Refill:  2  . ketorolac (TORADOL) injection 60 mg    Problem List Items Addressed This Visit      Unprioritized   Backache   Relevant Orders   DG Lumbar Spine Complete (Completed)   DG Sacrum/Coccyx (Completed)   Depression, major, single episode, mild (HCC) - Primary   Relevant Medications   mirtazapine (REMERON) 15 MG tablet   Tail bone pain   Relevant Orders   DG Lumbar Spine Complete (Completed)   DG Sacrum/Coccyx (Completed)    con't tramadol toradol shot today Xray --- may need mri    Follow-up: Return if symptoms worsen or fail to improve.  [name], DO

## 2020-02-11 NOTE — Patient Instructions (Signed)
Acute Back Pain, Adult Acute back pain is sudden and usually short-lived. It is often caused by an injury to the muscles and tissues in the back. The injury may result from:  A muscle or ligament getting overstretched or torn (strained). Ligaments are tissues that connect bones to each other. Lifting something improperly can cause a back strain.  Wear and tear (degeneration) of the spinal disks. Spinal disks are circular tissue that provides cushioning between the bones of the spine (vertebrae).  Twisting motions, such as while playing sports or doing yard work.  A hit to the back.  Arthritis. You may have a physical exam, lab tests, and imaging tests to find the cause of your pain. Acute back pain usually goes away with rest and home care. Follow these instructions at home: Managing pain, stiffness, and swelling  Take over-the-counter and prescription medicines only as told by your health care provider.  Your health care provider may recommend applying ice during the first 24-48 hours after your pain starts. To do this: ? Put ice in a plastic bag. ? Place a towel between your skin and the bag. ? Leave the ice on for 20 minutes, 2-3 times a day.  If directed, apply heat to the affected area as often as told by your health care provider. Use the heat source that your health care provider recommends, such as a moist heat pack or a heating pad. ? Place a towel between your skin and the heat source. ? Leave the heat on for 20-30 minutes. ? Remove the heat if your skin turns bright red. This is especially important if you are unable to feel pain, heat, or cold. You have a greater risk of getting burned. Activity   Do not stay in bed. Staying in bed for more than 1-2 days can delay your recovery.  Sit up and stand up straight. Avoid leaning forward when you sit, or hunching over when you stand. ? If you work at a desk, sit close to it so you do not need to lean over. Keep your chin tucked  in. Keep your neck drawn back, and keep your elbows bent at a right angle. Your arms should look like the letter "L." ? Sit high and close to the steering wheel when you drive. Add lower back (lumbar) support to your car seat, if needed.  Take short walks on even surfaces as soon as you are able. Try to increase the length of time you walk each day.  Do not sit, drive, or stand in one place for more than 30 minutes at a time. Sitting or standing for long periods of time can put stress on your back.  Do not drive or use heavy machinery while taking prescription pain medicine.  Use proper lifting techniques. When you bend and lift, use positions that put less stress on your back: ? Bend your knees. ? Keep the load close to your body. ? Avoid twisting.  Exercise regularly as told by your health care provider. Exercising helps your back heal faster and helps prevent back injuries by keeping muscles strong and flexible.  Work with a physical therapist to make a safe exercise program, as recommended by your health care provider. Do any exercises as told by your physical therapist. Lifestyle  Maintain a healthy weight. Extra weight puts stress on your back and makes it difficult to have good posture.  Avoid activities or situations that make you feel anxious or stressed. Stress and anxiety increase muscle   tension and can make back pain worse. Learn ways to manage anxiety and stress, such as through exercise. General instructions  Sleep on a firm mattress in a comfortable position. Try lying on your side with your knees slightly bent. If you lie on your back, put a pillow under your knees.  Follow your treatment plan as told by your health care provider. This may include: ? Cognitive or behavioral therapy. ? Acupuncture or massage therapy. ? Meditation or yoga. Contact a health care provider if:  You have pain that is not relieved with rest or medicine.  You have increasing pain going down  into your legs or buttocks.  Your pain does not improve after 2 weeks.  You have pain at night.  You lose weight without trying.  You have a fever or chills. Get help right away if:  You develop new bowel or bladder control problems.  You have unusual weakness or numbness in your arms or legs.  You develop nausea or vomiting.  You develop abdominal pain.  You feel faint. Summary  Acute back pain is sudden and usually short-lived.  Use proper lifting techniques. When you bend and lift, use positions that put less stress on your back.  Take over-the-counter and prescription medicines and apply heat or ice as directed by your health care provider. This information is not intended to replace advice given to you by your health care provider. Make sure you discuss any questions you have with your health care provider. Document Revised: 09/18/2018 Document Reviewed: 01/11/2017 Elsevier Patient Education  2020 [employer]  

## 2020-02-13 ENCOUNTER — Other Ambulatory Visit: Payer: Self-pay | Admitting: Family Medicine

## 2020-02-13 DIAGNOSIS — M549 Dorsalgia, unspecified: Secondary | ICD-10-CM

## 2020-02-13 NOTE — Telephone Encounter (Signed)
Thursday for MRI , but has not heard from ortho , looked in chart looks like they just sent the referral over yesterday(9/1)

## 2020-02-13 NOTE — Telephone Encounter (Signed)
Refill from 7 day supply , first filled on 02/10/20.

## 2020-02-13 NOTE — Telephone Encounter (Signed)
When is ortho appointment? I will refill med

## 2020-02-20 ENCOUNTER — Ambulatory Visit (HOSPITAL_COMMUNITY)
Admission: RE | Admit: 2020-02-20 | Discharge: 2020-02-20 | Disposition: A | Discharge status: Home / Self Care | Payer: Medicare Other | Source: Ambulatory Visit | Attending: Family Medicine | Admitting: Family Medicine

## 2020-02-20 ENCOUNTER — Telehealth: Payer: Self-pay | Admitting: Family Medicine

## 2020-02-20 ENCOUNTER — Other Ambulatory Visit: Payer: Self-pay

## 2020-02-20 DIAGNOSIS — M4804 Spinal stenosis, thoracic region: Secondary | ICD-10-CM | POA: Diagnosis not present

## 2020-02-20 DIAGNOSIS — M4316 Spondylolisthesis, lumbar region: Secondary | ICD-10-CM | POA: Insufficient documentation

## 2020-02-20 DIAGNOSIS — S22000A Wedge compression fracture of unspecified thoracic vertebra, initial encounter for closed fracture: Secondary | ICD-10-CM

## 2020-02-20 DIAGNOSIS — S22080A Wedge compression fracture of T11-T12 vertebra, initial encounter for closed fracture: Secondary | ICD-10-CM | POA: Diagnosis not present

## 2020-02-20 DIAGNOSIS — S2231XA Fracture of one rib, right side, initial encounter for closed fracture: Secondary | ICD-10-CM | POA: Diagnosis not present

## 2020-02-20 DIAGNOSIS — M48061 Spinal stenosis, lumbar region without neurogenic claudication: Secondary | ICD-10-CM | POA: Diagnosis not present

## 2020-02-20 DIAGNOSIS — M5124 Other intervertebral disc displacement, thoracic region: Secondary | ICD-10-CM | POA: Diagnosis not present

## 2020-02-20 MED ORDER — GADOBUTROL 1 MMOL/ML IV SOLN
5.0000 mL | Freq: Once | INTRAVENOUS | Status: AC | PRN
  Administered 2020-02-20: 5 mL via INTRAVENOUS

## 2020-02-20 NOTE — Telephone Encounter (Signed)
Called patient. Pt was not home at the time of the call

## 2020-02-20 NOTE — Telephone Encounter (Signed)
I would try miralax first if she has not

## 2020-02-20 NOTE — Telephone Encounter (Signed)
Caller : Railey  Call Back # [phone]  Patient states is constipation for 5-6 days, patient states she has taken constipation suppositories with minimum relief for a few days. Patient states her neighbor who is a nurse is willing to give her a enema.   Please advise if ok

## 2020-02-20 NOTE — Telephone Encounter (Signed)
Miralax? Please advise

## 2020-02-21 NOTE — Telephone Encounter (Signed)
Spoke with patient. Pt advised of recommendations

## 2020-02-24 DIAGNOSIS — M545 Low back pain: Secondary | ICD-10-CM | POA: Diagnosis not present

## 2020-02-25 DIAGNOSIS — M961 Postlaminectomy syndrome, not elsewhere classified: Secondary | ICD-10-CM | POA: Diagnosis not present

## 2020-02-27 ENCOUNTER — Telehealth: Payer: Self-pay | Admitting: Family Medicine

## 2020-02-27 NOTE — Telephone Encounter (Signed)
Caller: Ericca Call back # [phone]  Patient need a bedside commode and is wondering if insurance would pay for it.   Please advise.

## 2020-02-28 DIAGNOSIS — M5137 Other intervertebral disc degeneration, lumbosacral region: Secondary | ICD-10-CM | POA: Diagnosis not present

## 2020-02-28 DIAGNOSIS — M5416 Radiculopathy, lumbar region: Secondary | ICD-10-CM | POA: Diagnosis not present

## 2020-02-28 DIAGNOSIS — M5116 Intervertebral disc disorders with radiculopathy, lumbar region: Secondary | ICD-10-CM | POA: Diagnosis not present

## 2020-02-28 NOTE — Telephone Encounter (Signed)
Spoke with patient. Advised pt to check with insurance company

## 2020-03-16 DIAGNOSIS — M6281 Muscle weakness (generalized): Secondary | ICD-10-CM | POA: Diagnosis not present

## 2020-03-16 DIAGNOSIS — M792 Neuralgia and neuritis, unspecified: Secondary | ICD-10-CM | POA: Diagnosis not present

## 2020-03-20 ENCOUNTER — Other Ambulatory Visit: Payer: Self-pay | Admitting: Cardiology

## 2020-03-24 DIAGNOSIS — M545 Low back pain, unspecified: Secondary | ICD-10-CM | POA: Diagnosis not present

## 2020-03-24 DIAGNOSIS — G4700 Insomnia, unspecified: Secondary | ICD-10-CM | POA: Diagnosis not present

## 2020-03-24 DIAGNOSIS — Z9181 History of falling: Secondary | ICD-10-CM | POA: Diagnosis not present

## 2020-03-24 DIAGNOSIS — I10 Essential (primary) hypertension: Secondary | ICD-10-CM | POA: Diagnosis not present

## 2020-03-24 DIAGNOSIS — M800AXD Age-related osteoporosis with current pathological fracture, other site, subsequent encounter for fracture with routine healing: Secondary | ICD-10-CM | POA: Diagnosis not present

## 2020-03-31 DIAGNOSIS — I10 Essential (primary) hypertension: Secondary | ICD-10-CM | POA: Diagnosis not present

## 2020-03-31 DIAGNOSIS — M545 Low back pain, unspecified: Secondary | ICD-10-CM | POA: Diagnosis not present

## 2020-03-31 DIAGNOSIS — Z9181 History of falling: Secondary | ICD-10-CM | POA: Diagnosis not present

## 2020-03-31 DIAGNOSIS — M800AXD Age-related osteoporosis with current pathological fracture, other site, subsequent encounter for fracture with routine healing: Secondary | ICD-10-CM | POA: Diagnosis not present

## 2020-03-31 DIAGNOSIS — G4700 Insomnia, unspecified: Secondary | ICD-10-CM | POA: Diagnosis not present

## 2020-04-02 DIAGNOSIS — G4700 Insomnia, unspecified: Secondary | ICD-10-CM | POA: Diagnosis not present

## 2020-04-02 DIAGNOSIS — M545 Low back pain, unspecified: Secondary | ICD-10-CM | POA: Diagnosis not present

## 2020-04-02 DIAGNOSIS — Z9181 History of falling: Secondary | ICD-10-CM | POA: Diagnosis not present

## 2020-04-02 DIAGNOSIS — I10 Essential (primary) hypertension: Secondary | ICD-10-CM | POA: Diagnosis not present

## 2020-04-02 DIAGNOSIS — M800AXD Age-related osteoporosis with current pathological fracture, other site, subsequent encounter for fracture with routine healing: Secondary | ICD-10-CM | POA: Diagnosis not present

## 2020-04-07 DIAGNOSIS — M800AXD Age-related osteoporosis with current pathological fracture, other site, subsequent encounter for fracture with routine healing: Secondary | ICD-10-CM | POA: Diagnosis not present

## 2020-04-07 DIAGNOSIS — I10 Essential (primary) hypertension: Secondary | ICD-10-CM | POA: Diagnosis not present

## 2020-04-07 DIAGNOSIS — G4700 Insomnia, unspecified: Secondary | ICD-10-CM | POA: Diagnosis not present

## 2020-04-07 DIAGNOSIS — M545 Low back pain, unspecified: Secondary | ICD-10-CM | POA: Diagnosis not present

## 2020-04-07 DIAGNOSIS — Z9181 History of falling: Secondary | ICD-10-CM | POA: Diagnosis not present

## 2020-04-09 DIAGNOSIS — M545 Low back pain, unspecified: Secondary | ICD-10-CM | POA: Diagnosis not present

## 2020-04-09 DIAGNOSIS — I10 Essential (primary) hypertension: Secondary | ICD-10-CM | POA: Diagnosis not present

## 2020-04-09 DIAGNOSIS — G4700 Insomnia, unspecified: Secondary | ICD-10-CM | POA: Diagnosis not present

## 2020-04-09 DIAGNOSIS — M800AXD Age-related osteoporosis with current pathological fracture, other site, subsequent encounter for fracture with routine healing: Secondary | ICD-10-CM | POA: Diagnosis not present

## 2020-04-09 DIAGNOSIS — Z9181 History of falling: Secondary | ICD-10-CM | POA: Diagnosis not present

## 2020-04-14 DIAGNOSIS — Z9181 History of falling: Secondary | ICD-10-CM | POA: Diagnosis not present

## 2020-04-14 DIAGNOSIS — M545 Low back pain, unspecified: Secondary | ICD-10-CM | POA: Diagnosis not present

## 2020-04-14 DIAGNOSIS — I10 Essential (primary) hypertension: Secondary | ICD-10-CM | POA: Diagnosis not present

## 2020-04-14 DIAGNOSIS — G4700 Insomnia, unspecified: Secondary | ICD-10-CM | POA: Diagnosis not present

## 2020-04-14 DIAGNOSIS — M800AXD Age-related osteoporosis with current pathological fracture, other site, subsequent encounter for fracture with routine healing: Secondary | ICD-10-CM | POA: Diagnosis not present

## 2020-04-16 DIAGNOSIS — M800AXD Age-related osteoporosis with current pathological fracture, other site, subsequent encounter for fracture with routine healing: Secondary | ICD-10-CM | POA: Diagnosis not present

## 2020-04-16 DIAGNOSIS — M545 Low back pain, unspecified: Secondary | ICD-10-CM | POA: Diagnosis not present

## 2020-04-16 DIAGNOSIS — G4700 Insomnia, unspecified: Secondary | ICD-10-CM | POA: Diagnosis not present

## 2020-04-16 DIAGNOSIS — Z9181 History of falling: Secondary | ICD-10-CM | POA: Diagnosis not present

## 2020-04-16 DIAGNOSIS — I10 Essential (primary) hypertension: Secondary | ICD-10-CM | POA: Diagnosis not present

## 2020-04-21 DIAGNOSIS — M545 Low back pain, unspecified: Secondary | ICD-10-CM | POA: Diagnosis not present

## 2020-04-21 DIAGNOSIS — I10 Essential (primary) hypertension: Secondary | ICD-10-CM | POA: Diagnosis not present

## 2020-04-21 DIAGNOSIS — M800AXD Age-related osteoporosis with current pathological fracture, other site, subsequent encounter for fracture with routine healing: Secondary | ICD-10-CM | POA: Diagnosis not present

## 2020-04-21 DIAGNOSIS — G4700 Insomnia, unspecified: Secondary | ICD-10-CM | POA: Diagnosis not present

## 2020-04-21 DIAGNOSIS — Z9181 History of falling: Secondary | ICD-10-CM | POA: Diagnosis not present

## 2020-04-23 DIAGNOSIS — G4700 Insomnia, unspecified: Secondary | ICD-10-CM | POA: Diagnosis not present

## 2020-04-23 DIAGNOSIS — M545 Low back pain, unspecified: Secondary | ICD-10-CM | POA: Diagnosis not present

## 2020-04-23 DIAGNOSIS — Z9181 History of falling: Secondary | ICD-10-CM | POA: Diagnosis not present

## 2020-04-23 DIAGNOSIS — M800AXD Age-related osteoporosis with current pathological fracture, other site, subsequent encounter for fracture with routine healing: Secondary | ICD-10-CM | POA: Diagnosis not present

## 2020-04-23 DIAGNOSIS — I10 Essential (primary) hypertension: Secondary | ICD-10-CM | POA: Diagnosis not present

## 2020-04-29 ENCOUNTER — Ambulatory Visit: Payer: Medicare Other | Admitting: Cardiology

## 2020-05-12 ENCOUNTER — Ambulatory Visit: Payer: Medicare Other | Admitting: Family Medicine

## 2020-05-12 DIAGNOSIS — I10 Essential (primary) hypertension: Secondary | ICD-10-CM | POA: Diagnosis not present

## 2020-05-12 DIAGNOSIS — G4700 Insomnia, unspecified: Secondary | ICD-10-CM | POA: Diagnosis not present

## 2020-05-12 DIAGNOSIS — M800AXD Age-related osteoporosis with current pathological fracture, other site, subsequent encounter for fracture with routine healing: Secondary | ICD-10-CM | POA: Diagnosis not present

## 2020-05-12 DIAGNOSIS — M545 Low back pain, unspecified: Secondary | ICD-10-CM | POA: Diagnosis not present

## 2020-05-12 DIAGNOSIS — Z9181 History of falling: Secondary | ICD-10-CM | POA: Diagnosis not present

## 2020-05-19 DIAGNOSIS — M545 Low back pain, unspecified: Secondary | ICD-10-CM | POA: Diagnosis not present

## 2020-05-19 DIAGNOSIS — Z9181 History of falling: Secondary | ICD-10-CM | POA: Diagnosis not present

## 2020-05-19 DIAGNOSIS — M800AXD Age-related osteoporosis with current pathological fracture, other site, subsequent encounter for fracture with routine healing: Secondary | ICD-10-CM | POA: Diagnosis not present

## 2020-05-19 DIAGNOSIS — I10 Essential (primary) hypertension: Secondary | ICD-10-CM | POA: Diagnosis not present

## 2020-05-19 DIAGNOSIS — G4700 Insomnia, unspecified: Secondary | ICD-10-CM | POA: Diagnosis not present

## 2020-05-25 ENCOUNTER — Other Ambulatory Visit: Payer: Self-pay | Admitting: *Deleted

## 2020-05-25 DIAGNOSIS — F320 Major depressive disorder, single episode, mild: Secondary | ICD-10-CM

## 2020-05-25 MED ORDER — MIRTAZAPINE 15 MG PO TABS: 15.0000 mg | ORAL_TABLET | Freq: Every day | ORAL | 0 refills | Status: DC

## 2020-05-27 ENCOUNTER — Telehealth: Payer: Self-pay | Admitting: Cardiology

## 2020-05-27 DIAGNOSIS — I10 Essential (primary) hypertension: Secondary | ICD-10-CM

## 2020-05-27 MED ORDER — IRBESARTAN 300 MG PO TABS: ORAL_TABLET | ORAL | 0 refills | Status: DC

## 2020-05-27 NOTE — Telephone Encounter (Signed)
  *  STAT* If patient is at the pharmacy, call can be transferred to refill team.   1. Which medications need to be refilled? (please list name of each medication and dose if known)   irbesartan (AVAPRO) 300 MG tablet    2. Which pharmacy/location (including street and city if local pharmacy) is medication to be sent to? [id number] - [place], [place] - [address] AT [address]  3. Do they need a 30 day or 90 day supply? 7 days  [facility] delayed the delivery for her meds and pt is completely out of medication today. Please send emergency refill

## 2020-05-27 NOTE — Telephone Encounter (Signed)
Refill sent in per request.  

## 2020-07-23 ENCOUNTER — Other Ambulatory Visit: Payer: Self-pay | Admitting: Cardiology

## 2020-07-23 NOTE — Telephone Encounter (Signed)
Diltiazem 360 mg 24 HR CD #90 capsules with no additional refills sent to [facility]. Message sent patient needs appointment. 1st attempt

## 2020-07-24 ENCOUNTER — Other Ambulatory Visit: Payer: Self-pay | Admitting: Cardiology

## 2020-07-24 DIAGNOSIS — I10 Essential (primary) hypertension: Secondary | ICD-10-CM

## 2020-07-24 NOTE — Telephone Encounter (Signed)
Refill for Irbesartan 300 mg # 90 only sent to [facility]. Message sent to patient with refill that she needs to office visit for future refills, this is 2nd attempt

## 2020-08-12 ENCOUNTER — Other Ambulatory Visit: Payer: Self-pay | Admitting: Family Medicine

## 2020-08-12 DIAGNOSIS — R42 Dizziness and giddiness: Secondary | ICD-10-CM

## 2020-08-21 ENCOUNTER — Telehealth: Payer: Self-pay | Admitting: Family Medicine

## 2020-08-21 NOTE — Telephone Encounter (Signed)
Left message for patient to schedule Annual Wellness Visit.  Please schedule with Health Nurse Advisor Augustine Radar. at [facility].

## 2020-09-11 DIAGNOSIS — H524 Presbyopia: Secondary | ICD-10-CM | POA: Diagnosis not present

## 2020-09-11 DIAGNOSIS — H40013 Open angle with borderline findings, low risk, bilateral: Secondary | ICD-10-CM | POA: Diagnosis not present

## 2020-09-11 DIAGNOSIS — H04123 Dry eye syndrome of bilateral lacrimal glands: Secondary | ICD-10-CM | POA: Diagnosis not present

## 2020-09-11 DIAGNOSIS — H26492 Other secondary cataract, left eye: Secondary | ICD-10-CM | POA: Diagnosis not present

## 2020-09-14 ENCOUNTER — Other Ambulatory Visit: Payer: Self-pay

## 2020-09-14 MED ORDER — DILTIAZEM HCL ER COATED BEADS 360 MG PO CP24: 360.0000 mg | ORAL_CAPSULE | Freq: Every day | ORAL | 2 refills | Status: DC

## 2020-09-14 NOTE — Telephone Encounter (Signed)
Diltiazem approved and sent

## 2020-10-22 ENCOUNTER — Other Ambulatory Visit: Payer: Self-pay | Admitting: Cardiology

## 2020-10-22 DIAGNOSIS — I10 Essential (primary) hypertension: Secondary | ICD-10-CM

## 2020-11-04 ENCOUNTER — Other Ambulatory Visit: Payer: Self-pay

## 2020-11-05 ENCOUNTER — Ambulatory Visit (INDEPENDENT_AMBULATORY_CARE_PROVIDER_SITE_OTHER): Payer: Medicare Other | Admitting: Family Medicine

## 2020-11-05 ENCOUNTER — Encounter: Payer: Self-pay | Admitting: Family Medicine

## 2020-11-05 VITALS — BP 120/82 | HR 104 | Temp 98.4°F | Resp 18 | Ht 60.0 in | Wt 95.6 lb

## 2020-11-05 DIAGNOSIS — I10 Essential (primary) hypertension: Secondary | ICD-10-CM | POA: Diagnosis not present

## 2020-11-05 DIAGNOSIS — E559 Vitamin D deficiency, unspecified: Secondary | ICD-10-CM

## 2020-11-05 DIAGNOSIS — Z0000 Encounter for general adult medical examination without abnormal findings: Secondary | ICD-10-CM | POA: Diagnosis not present

## 2020-11-05 DIAGNOSIS — M810 Age-related osteoporosis without current pathological fracture: Secondary | ICD-10-CM | POA: Diagnosis not present

## 2020-11-05 NOTE — Patient Instructions (Signed)
Preventive Care 85 Years and Older, Female Preventive care refers to lifestyle choices and visits with your health care provider that can promote health and wellness. This includes:  A yearly physical exam. This is also called an annual wellness visit.  Regular dental and eye exams.  Immunizations.  Screening for certain conditions.  Healthy lifestyle choices, such as: ? Eating a healthy diet. ? Getting regular exercise. ? Not using drugs or products that contain nicotine and tobacco. ? Limiting alcohol use. What can I expect for my preventive care visit? Physical exam Your health care provider will check your:  Height and weight. These may be used to calculate your BMI (body mass index). BMI is a measurement that tells if you are at a healthy weight.  Heart rate and blood pressure.  Body temperature.  Skin for abnormal spots. Counseling Your health care provider may ask you questions about your:  Past medical problems.  Family's medical history.  Alcohol, tobacco, and drug use.  Emotional well-being.  Home life and relationship well-being.  Sexual activity.  Diet, exercise, and sleep habits.  History of falls.  Memory and ability to understand (cognition).  Work and work Statistician.  Pregnancy and menstrual history.  Access to firearms. What immunizations do I need? Vaccines are usually given at various ages, according to a schedule. Your health care provider will recommend vaccines for you based on your age, medical history, and lifestyle or other factors, such as travel or where you work.   What tests do I need? Blood tests  Lipid and cholesterol levels. These may be checked every 5 years, or more often depending on your overall health.  Hepatitis C test.  Hepatitis B test. Screening  Lung cancer screening. You may have this screening every year starting at age 85 if you have a 30-pack-year history of smoking and currently smoke or have quit within  the past 15 years.  Colorectal cancer screening. ? All adults should have this screening starting at age 85 and continuing until age 85. ? Your health care provider may recommend screening at age 85 if you are at increased risk. ? You will have tests every 1-10 years, depending on your results and the type of screening test.  Diabetes screening. ? This is done by checking your blood sugar (glucose) after you have not eaten for a while (fasting). ? You may have this done every 1-3 years.  Mammogram. ? This may be done every 1-2 years. ? Talk with your health care provider about how often you should have regular mammograms.  Abdominal aortic aneurysm (AAA) screening. You may need this if you are a current or former smoker.  BRCA-related cancer screening. This may be done if you have a family history of breast, ovarian, tubal, or peritoneal cancers. Other tests  STD (sexually transmitted disease) testing, if you are at risk.  Bone density scan. This is done to screen for osteoporosis. You may have this done starting at age 85. Talk with your health care provider about your test results, treatment options, and if necessary, the need for more tests. Follow these instructions at home: Eating and drinking  Eat a diet that includes fresh fruits and vegetables, whole grains, lean protein, and low-fat dairy products. Limit your intake of foods with high amounts of sugar, saturated fats, and salt.  Take vitamin and mineral supplements as recommended by your health care provider.  Do not drink alcohol if your health care provider tells you not to drink.  If you drink alcohol: ? Limit how much you have to 0-1 drink a day. ? Be aware of how much alcohol is in your drink. In the [place] one drink equals one 12 oz bottle of beer (355 mL), one 5 oz glass of wine (148 mL), or one 1 oz glass of hard liquor (44 mL).   Lifestyle  Take daily care of your teeth and gums. Brush your teeth every morning  and night with fluoride toothpaste. Floss one time each day.  Stay active. Exercise for at least 30 minutes 5 or more days each week.  Do not use any products that contain nicotine or tobacco, such as cigarettes, e-cigarettes, and chewing tobacco. If you need help quitting, ask your health care provider.  Do not use drugs.  If you are sexually active, practice safe sex. Use a condom or other form of protection in order to prevent STIs (sexually transmitted infections).  Talk with your health care provider about taking a low-dose aspirin or statin.  Find healthy ways to cope with stress, such as: ? Meditation, yoga, or listening to music. ? Journaling. ? Talking to a trusted person. ? Spending time with friends and family. Safety  Always wear your seat belt while driving or riding in a vehicle.  Do not drive: ? If you have been drinking alcohol. Do not ride with someone who has been drinking. ? When you are tired or distracted. ? While texting.  Wear a helmet and other protective equipment during sports activities.  If you have firearms in your house, make sure you follow all gun safety procedures. What's next?  Visit your health care provider once a year for an annual wellness visit.  Ask your health care provider how often you should have your eyes and teeth checked.  Stay up to date on all vaccines. This information is not intended to replace advice given to you by your health care provider. Make sure you discuss any questions you have with your health care provider. Document Revised: 05/20/2020 Document Reviewed: 05/24/2018 Elsevier Patient Education  2021 Elsevier Inc.  

## 2020-11-05 NOTE — Progress Notes (Signed)
Subjective:   By signing my name below, I, [name], attest that this documentation has been prepared under the direction and in the presence of Dr. Roma Schanz, DO. 11/05/2020     Patient ID: Cassidy Brown, female    DOB: 1929/10/02, 87 y.o.   MRN: [id number]  Chief Complaint  Patient presents with  . Annual Exam    Covid vaccine: 2/3 Tdap: has not d/t cost Zoster: thinks she has had this Concerns/questions: None -tlc,rma    HPI Patient is in today for a comprehensive physical exam. She is doing well at this time.  Her heart rate is elevated but her blood pressure has improved. She complains of a headache at this time. She is taking 2 tylenols to manage her headache and finds relief.   BP Readings from Last 3 Encounters:  11/05/20 120/82  02/11/20 (!) 160/80  02/05/20 (!) 144/79   She is refusing medication for her osteoporosis at this time. She denies having any abdominal pain at this time.  She has 2 Covid-19 vaccines at this time and is willing to get the booster vaccines at a later date. She is UTD on dental care and vision care.      Past Medical History:  Diagnosis Date  . Allergy   . Anxiety 07/20/2017  . Backache 06/15/2010   Qualifier: Diagnosis of  By: Jerold Coombe    . Bleeding nose 08/03/2017   Last Assessment & Plan:  Formatting of this note might be different from the original. Concern over nosebleeds. Chronic history of intermittent bleeding primarily from the right nostril.  Has never required packing or cautery.  No other anticoagulant therapy. EXAM shows excoriated area right anterior nasal septum. PLAN: Cauterized right nasal septum with silver nitrate.  Follow-up as needed.  . Bradycardia 02/25/2019  . BREAST CANCER, HX OF 03/07/2008   Qualifier: Diagnosis of  By: Jerold Coombe    . Cancer ([place])    breast,hx of  . Diverticulosis   . DIVERTICULOSIS, COLON 10/13/2006   Qualifier: Diagnosis of  By: Jerold Coombe    . DIZZINESS OR  VERTIGO 10/13/2006   Qualifier: Diagnosis of  By: Jerold Coombe    . Dyspnea on exertion 02/25/2019  . Essential hypertension 04/02/2017  . Glaucoma   . Hypertension   . HYPERTENSION 10/13/2006   Qualifier: Diagnosis of  By: Jerold Coombe    . Loose stools 04/30/2018  . Osteoporosis   . OSTEOPOROSIS 10/13/2006   Qualifier: Diagnosis of  By: Jerold Coombe    . Pansinusitis 04/02/2017  . Poliomyelitis   . POLIOMYELITIS, HX OF 11/20/2007   Qualifier: Diagnosis of  By: Jerold Coombe    . SCOLIOSIS 11/20/2007   Qualifier: Diagnosis of  By: Jerold Coombe    . Sensation of fullness in ear 08/03/2017   Last Assessment & Plan:  Formatting of this note might be different from the original. Concern over fullness at the right ear. 43-month history of fullness at the right ear.  She cannot use topical nasal steroid sprays due to epistaxis.  She is not improving. EXAM shows decreased movement of the right tympanic membrane on insufflation.  Left moves well.  Weber tuning fork lateralizes of the right e  . Vertigo    dizziness    Past Surgical History:  Procedure Laterality Date  . BREAST SURGERY Left    Lumpectomy  . DILATION AND CURETTAGE OF UTERUS     x3  .  ganglion cyst r wrist    . LUMBAR LAMINECTOMY    . SPINE SURGERY  11/2010   fusion--jenkins    Family History  Problem Relation Age of Onset  . Hypertension Father   . Kidney disease Father   . Hypertension Sister   . Coronary artery disease Other        P uncles    Social History   Socioeconomic History  . Marital status: Married    Spouse name: Not on file  . Number of children: Not on file  . Years of education: Not on file  . Highest education level: Not on file  Occupational History  . 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 Smoking status: Never Smoker  . Smokeless tobacco: Never Used  Substance and Sexual Activity  . Alcohol use: No  . Drug use: No  . Sexual activity:  Never    Partners: Male  Other Topics Concern  . Not on file  Social History Narrative   Exercise-- walking 1 x a week   Social Determinants of Health   Financial Resource Strain: Not on file  Food Insecurity: Not on file  Transportation Needs: Not on file  Physical Activity: Not on file  Stress: Not on file  Social Connections: Not on file  Intimate Partner Violence: Not on file    Outpatient Medications Prior to Visit  Medication Sig Dispense Refill  . b complex vitamins tablet Take 1 tablet by mouth daily.    . calcium citrate-vitamin D (CITRACAL+D) 315-200 MG-UNIT per tablet Take 1 tablet by mouth 3 (three) times daily.    . cholecalciferol (VITAMIN D) 1000 UNITS tablet Take 1,000 Units by mouth daily.    Marland Kitchen diltiazem (CARDIZEM CD) 360 MG 24 hr capsule Take 1 capsule (360 mg total) by mouth daily. Needs appointment for future refill 90 capsule 2  . irbesartan (AVAPRO) 300 MG tablet TAKE 1 TABLET BY MOUTH  DAILY 30 tablet 0  . latanoprost (XALATAN) 0.005 % ophthalmic solution Place 1 drop into both eyes at bedtime.    Marland Kitchen loratadine (CLARITIN) 10 MG tablet Take 10 mg by mouth daily.    Marland Kitchen LORazepam (ATIVAN) 1 MG tablet TAKE 1 TABLET EVERY 8 HOURS 60 tablet 0  . MAGNESIUM PO Take 500 mg by mouth daily.     . meclizine (ANTIVERT) 25 MG tablet Take 1 tablet (25 mg total) by mouth 3 (three) times daily as needed. 60 tablet 1  . Misc Natural Products (LUTEIN 20 PO) Take 1 capsule by mouth daily.    . multivitamin-lutein (OCUVITE-LUTEIN) CAPS Take 1 capsule by mouth daily.    . NONFORMULARY OR COMPOUNDED ITEM Light weight walker #1  As directed    Dx--balance issues,  Osteoporosis, dizziness 1 each 0  . NONFORMULARY OR COMPOUNDED ITEM Walker  Dx osteoarthritis, weakness    Use as directed 1 each 0  . spironolactone (ALDACTONE) 25 MG tablet Take 0.5 tablets (12.5 mg total) by mouth daily. 45 tablet 3  . traMADol (ULTRAM) 50 MG tablet TAKE 1 TABLET(50 MG) BY MOUTH EVERY 8 HOURS FOR UP TO 5  DAYS AS NEEDED 30 tablet 1  . mirtazapine (REMERON) 15 MG tablet Take 1 tablet (15 mg total) by mouth at bedtime. (Patient not taking: Reported on 11/05/2020) 90 tablet 0  . ondansetron (ZOFRAN ODT) 4 MG disintegrating tablet Take 1 tablet (4 mg total) by mouth every 8 (eight) hours as needed for  nausea or vomiting. (Patient not taking: Reported on 11/05/2020) 20 tablet 0   No facility-administered medications prior to visit.    Allergies  Allergen Reactions  . Amoxicillin Diarrhea    "I had diarrhea really bad."  . Coreg [Carvedilol] Diarrhea  . Hydrocodone   . Plendil [Felodipine]     Caused pains in back and stomach and caused constipation  . Propoxyphene N-Acetaminophen   . Sertraline Hcl Other (See Comments)    Per patient shaking, Headache and weird feeling in her eyes.  Denzil Hughes Hcl]     Made patient feel really bad and was ineffective in decreasing BP    Review of Systems  Constitutional: Negative for chills, fever and malaise/fatigue.  HENT: Negative for congestion and hearing loss.   Eyes: Negative for discharge.  Respiratory: Negative for cough, sputum production and shortness of breath.   Cardiovascular: Negative for chest pain, palpitations and leg swelling.  Gastrointestinal: Negative for abdominal pain, blood in stool, constipation, diarrhea, heartburn, nausea and vomiting.  Genitourinary: Negative for dysuria, frequency, hematuria and urgency.  Musculoskeletal: Negative for back pain, falls and myalgias.  Skin: Negative for rash.  Neurological: Positive for headaches. Negative for dizziness, sensory change, loss of consciousness and weakness.  Endo/Heme/Allergies: Negative for environmental allergies. Does not bruise/bleed easily.  Psychiatric/Behavioral: Negative for depression and suicidal ideas. The patient is not nervous/anxious and does not have insomnia.        Objective:    Physical Exam Vitals and nursing note reviewed.  Constitutional:       General: She is not in acute distress.    Appearance: Normal appearance. She is not ill-appearing.  HENT:     Head: Normocephalic and atraumatic.     Right Ear: Tympanic membrane and external ear normal.     Left Ear: Tympanic membrane and external ear normal.  Eyes:     Extraocular Movements: Extraocular movements intact.     Pupils: Pupils are equal, round, and reactive to light.  Cardiovascular:     Rate and Rhythm: Normal rate and regular rhythm.     Pulses: Normal pulses.     Heart sounds: Normal heart sounds. No murmur heard. No gallop.   Pulmonary:     Effort: Pulmonary effort is normal. No respiratory distress.     Breath sounds: Normal breath sounds. No wheezing, rhonchi or rales.  Abdominal:     General: Bowel sounds are normal. There is no distension.     Palpations: Abdomen is soft. There is no mass.     Tenderness: There is no abdominal tenderness. There is no guarding or rebound.     Hernia: No hernia is present.  Musculoskeletal:     Comments: She as Kyphosis. Ambulates with her walker.  Skin:    General: Skin is warm and dry.  Neurological:     Mental Status: She is alert and oriented to person, place, and time.  Psychiatric:        Behavior: Behavior normal.     BP 120/82 (BP Location: Left Arm, Patient Position: Sitting, Cuff Size: Normal)   Pulse (!) 104   Temp 98.4 F (36.9 C) (Oral)   Resp 18   Ht 5' (1.524 m)   Wt 95 lb 9.6 oz (43.4 kg)   SpO2 98%   BMI 18.67 kg/m  Wt Readings from Last 3 Encounters:  11/05/20 95 lb 9.6 oz (43.4 kg)  02/05/20 120 lb (54.4 kg)  12/10/19 113 lb 12.8 oz (51.6 kg)  Diabetic Foot Exam - Simple   No data filed    Lab Results  Component Value Date   WBC 5.1 11/05/2020   HGB 12.7 11/05/2020   HCT 37.2 11/05/2020   PLT 268.0 11/05/2020   GLUCOSE 113 (H) 11/05/2020   CHOL 168 11/05/2020   TRIG 61.0 11/05/2020   HDL 94.60 11/05/2020   LDLCALC 61 11/05/2020   ALT 24 11/05/2020   AST 22 11/05/2020   NA 132  (L) 11/05/2020   K 4.8 11/05/2020   CL 96 11/05/2020   CREATININE 0.70 11/05/2020   BUN 29 (H) 11/05/2020   CO2 30 11/05/2020   TSH 1.21 08/30/2017   HGBA1C 5.5 08/20/2013   MICROALBUR 0.9 09/02/2014    Lab Results  Component Value Date   TSH 1.21 08/30/2017   Lab Results  Component Value Date   WBC 5.1 11/05/2020   HGB 12.7 11/05/2020   HCT 37.2 11/05/2020   MCV 87.1 11/05/2020   PLT 268.0 11/05/2020   Lab Results  Component Value Date   NA 132 (L) 11/05/2020   K 4.8 11/05/2020   CO2 30 11/05/2020   GLUCOSE 113 (H) 11/05/2020   BUN 29 (H) 11/05/2020   CREATININE 0.70 11/05/2020   BILITOT 0.3 11/05/2020   ALKPHOS 63 11/05/2020   AST 22 11/05/2020   ALT 24 11/05/2020   PROT 6.4 11/05/2020   ALBUMIN 4.2 11/05/2020   CALCIUM 10.1 11/05/2020   ANIONGAP 8 08/29/2017   GFR 77.04 11/05/2020   Lab Results  Component Value Date   CHOL 168 11/05/2020   Lab Results  Component Value Date   HDL 94.60 11/05/2020   Lab Results  Component Value Date   LDLCALC 61 11/05/2020   Lab Results  Component Value Date   TRIG 61.0 11/05/2020   Lab Results  Component Value Date   CHOLHDL 2 11/05/2020   Lab Results  Component Value Date   HGBA1C 5.5 08/20/2013   Colonoscopy- Not completed. Dexa- Last completed on 01/29/2020. Results showed osteoporosis. Mammogram- Last completed 01/29/2020.  Pap Smear- Last completed 09/26/2007. Results normal.    Assessment & Plan:   Problem List Items Addressed This Visit      Unprioritized   Essential hypertension    Well controlled, no changes to meds. Encouraged heart healthy diet such as the DASH diet and exercise as tolerated.       Osteoporosis    bmd due next year  con't ca and vita d       Preventative health care - Primary    ghm utd  Check labs       Relevant Orders   Lipid panel (Completed)   Comprehensive metabolic panel (Completed)   CBC with Differential/Platelet (Completed)   Vitamin D (25 hydroxy)  (Completed)    Other Visit Diagnoses    Vitamin D deficiency       Relevant Orders   Vitamin D (25 hydroxy) (Completed)   Primary hypertension       Relevant Orders   Lipid panel (Completed)   Comprehensive metabolic panel (Completed)   CBC with Differential/Platelet (Completed)   Vitamin D (25 hydroxy) (Completed)       No orders of the defined types were placed in this encounter.   I, Dr. Roma Schanz, DO, personally preformed the services described in this documentation.  All medical record entries made by the scribe were at my direction and in my presence.  I have reviewed the chart and discharge instructions (if applicable)  and agree that the record reflects my personal performance and is accurate and complete. 11/05/2020   I,[name],acting as a scribe for [name], DO.,have documented all relevant documentation on the behalf of [name], DO,as directed by  [name], DO while in the presence of [name], DO.   [name], DO

## 2020-11-06 DIAGNOSIS — Z0000 Encounter for general adult medical examination without abnormal findings: Secondary | ICD-10-CM | POA: Insufficient documentation

## 2020-11-06 LAB — CBC WITH DIFFERENTIAL/PLATELET
Basophils Absolute: 0.1 10*3/uL (ref 0.0–0.1)
Basophils Relative: 1.3 % (ref 0.0–3.0)
Eosinophils Absolute: 0.1 10*3/uL (ref 0.0–0.7)
Eosinophils Relative: 1.2 % (ref 0.0–5.0)
HCT: 37.2 % (ref 36.0–46.0)
Hemoglobin: 12.7 g/dL (ref 12.0–15.0)
Lymphocytes Relative: 18.4 % (ref 12.0–46.0)
Lymphs Abs: 0.9 10*3/uL (ref 0.7–4.0)
MCHC: 34.1 g/dL (ref 30.0–36.0)
MCV: 87.1 fl (ref 78.0–100.0)
Monocytes Absolute: 0.6 10*3/uL (ref 0.1–1.0)
Monocytes Relative: 11.1 % (ref 3.0–12.0)
Neutro Abs: 3.5 10*3/uL (ref 1.4–7.7)
Neutrophils Relative %: 68 % (ref 43.0–77.0)
Platelets: 268 10*3/uL (ref 150.0–400.0)
RBC: 4.27 Mil/uL (ref 3.87–5.11)
RDW: 13.8 % (ref 11.5–15.5)
WBC: 5.1 10*3/uL (ref 4.0–10.5)

## 2020-11-06 LAB — COMPREHENSIVE METABOLIC PANEL
ALT: 24 U/L (ref 0–35)
AST: 22 U/L (ref 0–37)
Albumin: 4.2 g/dL (ref 3.5–5.2)
Alkaline Phosphatase: 63 U/L (ref 39–117)
BUN: 29 mg/dL — ABNORMAL HIGH (ref 6–23)
CO2: 30 mEq/L (ref 19–32)
Calcium: 10.1 mg/dL (ref 8.4–10.5)
Chloride: 96 mEq/L (ref 96–112)
Creatinine, Ser: 0.7 mg/dL (ref 0.40–1.20)
GFR: 77.04 mL/min (ref >60.00)
Glucose, Bld: 113 mg/dL — ABNORMAL HIGH (ref 70–99)
Potassium: 4.8 mEq/L (ref 3.5–5.1)
Sodium: 132 mEq/L — ABNORMAL LOW (ref 135–145)
Total Bilirubin: 0.3 mg/dL (ref 0.2–1.2)
Total Protein: 6.4 g/dL (ref 6.0–8.3)

## 2020-11-06 LAB — LIPID PANEL
Cholesterol: 168 mg/dL (ref 0–200)
HDL: 94.6 mg/dL (ref >39.00)
LDL Cholesterol: 61 mg/dL (ref 0–99)
NonHDL: 73.12
Total CHOL/HDL Ratio: 2
Triglycerides: 61 mg/dL (ref 0.0–149.0)
VLDL: 12.2 mg/dL (ref 0.0–40.0)

## 2020-11-06 LAB — VITAMIN D 25 HYDROXY (VIT D DEFICIENCY, FRACTURES): VITD: 48.68 ng/mL (ref 30.00–100.00)

## 2020-11-06 NOTE — Assessment & Plan Note (Signed)
Well controlled, no changes to meds. Encouraged heart healthy diet such as the DASH diet and exercise as tolerated.  °

## 2020-11-06 NOTE — Assessment & Plan Note (Signed)
bmd due next year  con't ca and vita d

## 2020-11-06 NOTE — Assessment & Plan Note (Signed)
ghm utd Check labs  

## 2020-11-12 ENCOUNTER — Telehealth: Payer: Self-pay | Admitting: Family Medicine

## 2020-11-12 NOTE — Telephone Encounter (Signed)
Results mailed 

## 2020-11-12 NOTE — Telephone Encounter (Signed)
Patient would like lab results to be mailed to her

## 2020-11-17 ENCOUNTER — Telehealth: Payer: Self-pay | Admitting: Cardiology

## 2020-11-17 NOTE — Telephone Encounter (Signed)
Cassidy Brown is calling requesting a virtual appointment with Dr. Agustin Cree due to having to have 24 hr care assistance at their home due to her husband having dementia and not being able to be left alone so she does not have transportation to and from. She states she wants it via phone due to not using a computer. Did not schedule due to Dr. Agustin Cree not having virtual slots on his template. Please advise.

## 2020-11-18 NOTE — Telephone Encounter (Signed)
Patient scheduled for virtual visit  

## 2020-11-19 ENCOUNTER — Telehealth: Payer: Self-pay | Admitting: *Deleted

## 2020-11-19 DIAGNOSIS — I10 Essential (primary) hypertension: Secondary | ICD-10-CM

## 2020-11-19 MED ORDER — IRBESARTAN 300 MG PO TABS: ORAL_TABLET | ORAL | 3 refills | Status: DC

## 2020-11-19 NOTE — Telephone Encounter (Signed)
Rx refill sent to pharmacy. 

## 2021-01-05 DIAGNOSIS — A809 Acute poliomyelitis, unspecified: Secondary | ICD-10-CM | POA: Insufficient documentation

## 2021-01-05 DIAGNOSIS — H409 Unspecified glaucoma: Secondary | ICD-10-CM | POA: Insufficient documentation

## 2021-01-05 DIAGNOSIS — K5790 Diverticulosis of intestine, part unspecified, without perforation or abscess without bleeding: Secondary | ICD-10-CM | POA: Insufficient documentation

## 2021-01-07 ENCOUNTER — Encounter: Payer: Self-pay | Admitting: Cardiology

## 2021-01-07 ENCOUNTER — Telehealth (INDEPENDENT_AMBULATORY_CARE_PROVIDER_SITE_OTHER): Payer: Medicare Other | Admitting: Cardiology

## 2021-01-07 ENCOUNTER — Telehealth: Payer: Medicare Other | Admitting: Cardiology

## 2021-01-07 VITALS — BP 191/101 | HR 87 | Ht 60.0 in | Wt 87.0 lb

## 2021-01-07 DIAGNOSIS — I10 Essential (primary) hypertension: Secondary | ICD-10-CM | POA: Diagnosis not present

## 2021-01-07 DIAGNOSIS — R0609 Other forms of dyspnea: Secondary | ICD-10-CM

## 2021-01-07 DIAGNOSIS — R001 Bradycardia, unspecified: Secondary | ICD-10-CM

## 2021-01-07 DIAGNOSIS — R0600 Dyspnea, unspecified: Secondary | ICD-10-CM

## 2021-01-07 DIAGNOSIS — F320 Major depressive disorder, single episode, mild: Secondary | ICD-10-CM

## 2021-01-07 NOTE — Progress Notes (Signed)
Virtual Visit via Telephone Note   This visit type was conducted due to national recommendations for restrictions regarding the COVID-19 Pandemic (e.g. social distancing) in an effort to limit this patient's exposure and mitigate transmission in our community.  Due to her co-morbid illnesses, this patient is at least at moderate risk for complications without adequate follow up.  This format is felt to be most appropriate for this patient at this time.  The patient did not have access to video technology/had technical difficulties with video requiring transitioning to audio format only (telephone).  All issues noted in this document were discussed and addressed.  No physical exam could be performed with this format.  Please refer to the patient's chart for her  consent to telehealth for [facility].  Evaluation Performed:  Follow-up visit  This visit type was conducted due to national recommendations for restrictions regarding the COVID-19 Pandemic (e.g. social distancing).  This format is felt to be most appropriate for this patient at this time.  All issues noted in this document were discussed and addressed.  No physical exam was performed (except for noted visual exam findings with Video Visits).  Please refer to the patient's chart (MyChart message for video visits and phone note for telephone visits) for the patient's consent to telehealth for [facility].  Date:  01/07/2021  ID: Cassidy Brown, DOB 08-24-1929, MRN [id number]   Patient Location: [place] [id number]   Provider location:   [place] Office  PCP:  Carollee Herter, Alferd Apa, DO  Cardiologist:  Jenne Campus, MD     Chief Complaint: I am doing fine  History of Present Illness:    Cassidy Brown is a 87 y.o. female  who presents via audio/video conferencing for a telehealth visit today.  With past medical history significant for essential hypertension, dyslipidemia, bradycardia.  I do  have virtual visit with her today.  She was unable to establish video link therefore we only talking on the phone.  The problem is she does have a husband that she has been married to for 10 years and he is demented she is staying with him all the time taking care of him all the time and simply it is very difficult for her to find coverage for time she need to go to the doctor and referral looks like cardiac wise she seems to be doing well.  She fell down a month ago however she simply tripped.  There was no dizziness no syncope.  Overall denies have any chest pain tightness squeezing pressure burning chest, there is some shortness of breath and fatigue present.   The patient does not have symptoms concerning for COVID-19 infection (fever, chills, cough, or new SHORTNESS OF BREATH).    Prior CV studies:   The following studies were reviewed today:       Past Medical History:  Diagnosis Date   Allergy    Anxiety 07/20/2017   Backache 06/15/2010   Qualifier: Diagnosis of  By: Jerold Coombe     Bleeding nose 08/03/2017   Last Assessment & Plan:  Formatting of this note might be different from the original. Concern over nosebleeds. Chronic history of intermittent bleeding primarily from the right nostril.  Has never required packing or cautery.  No other anticoagulant therapy. EXAM shows excoriated area right anterior nasal septum. PLAN: Cauterized right nasal septum with silver nitrate.  Follow-up as needed.   Bradycardia 02/25/2019   BREAST CANCER,  HX OF 03/07/2008   Qualifier: Diagnosis of  By: Lowne DO, [facility])    breast,hx of   Diverticulosis    DIVERTICULOSIS, COLON 10/13/2006   Qualifier: Diagnosis of  By: Jerold Coombe     DIZZINESS OR VERTIGO 10/13/2006   Qualifier: Diagnosis of  By: Jerold Coombe     Dyspnea on exertion 02/25/2019   Essential hypertension 04/02/2017   Glaucoma    Hypertension    HYPERTENSION 10/13/2006   Qualifier: Diagnosis of  By: Etter Sjogren DO,  Yvonne     Loose stools 04/30/2018   Osteoporosis    OSTEOPOROSIS 10/13/2006   Qualifier: Diagnosis of  By: Jerold Coombe     Pansinusitis 04/02/2017   Poliomyelitis    POLIOMYELITIS, HX OF 11/20/2007   Qualifier: Diagnosis of  By: Jerold Coombe     SCOLIOSIS 11/20/2007   Qualifier: Diagnosis of  By: Jerold Coombe     Sensation of fullness in ear 08/03/2017   Last Assessment & Plan:  Formatting of this note might be different from the original. Concern over fullness at the right ear. 56-monthhistory of fullness at the right ear.  She cannot use topical nasal steroid sprays due to epistaxis.  She is not improving. EXAM shows decreased movement of the right tympanic membrane on insufflation.  Left moves well.  Weber tuning fork lateralizes of the right e   Vertigo    dizziness    Past Surgical History:  Procedure Laterality Date   BREAST SURGERY Left    Lumpectomy   DILATION AND CURETTAGE OF UTERUS     x3   ganglion cyst r wrist     LUMBAR LAMINECTOMY     SPINE SURGERY  11/2010   fusion--jenkins     Current Meds  Medication Sig   b complex vitamins tablet Take 1 tablet by mouth daily. Unknown strength   calcium citrate-vitamin D (CITRACAL+D) 315-200 MG-UNIT per tablet Take 1 tablet by mouth 3 (three) times daily.   diltiazem (CARDIZEM CD) 360 MG 24 hr capsule Take 1 capsule (360 mg total) by mouth daily. Needs appointment for future refill   irbesartan (AVAPRO) 300 MG tablet TAKE 1 TABLET BY MOUTH  DAILY (Patient taking differently: Take 300 mg by mouth daily. TAKE 1 TABLET BY MOUTH  DAILY)   latanoprost (XALATAN) 0.005 % ophthalmic solution Place 1 drop into both eyes at bedtime.   loratadine (CLARITIN) 10 MG tablet Take 10 mg by mouth daily as needed for allergies.   LORazepam (ATIVAN) 1 MG tablet TAKE 1 TABLET EVERY 8 HOURS (Patient taking differently: Take 1 mg by mouth as needed for anxiety. TAKE 1 TABLET EVERY 8 HOURS)   Magnesium Citrate 100 MG TABS Take 150 mg by mouth  daily.   meclizine (ANTIVERT) 25 MG tablet Take 1 tablet (25 mg total) by mouth 3 (three) times daily as needed. (Patient taking differently: Take 25 mg by mouth 3 (three) times daily as needed for dizziness.)   multivitamin-lutein (OCUVITE-LUTEIN) CAPS Take 1 capsule by mouth daily. Unknown strength   spironolactone (ALDACTONE) 25 MG tablet Take 0.5 tablets (12.5 mg total) by mouth daily.      Family History: The patient's family history includes Coronary artery disease in an other family member; Hypertension in her father and sister; Kidney disease in her father.   ROS:   Please see the history of present illness.     All other systems reviewed and are  negative.   Labs/Other Tests and Data Reviewed:     Recent Labs: 11/05/2020: ALT 24; BUN 29; Creatinine, Ser 0.70; Hemoglobin 12.7; Platelets 268.0; Potassium 4.8; Sodium 132  Recent Lipid Panel    Component Value Date/Time   CHOL 168 11/05/2020 1438   TRIG 61.0 11/05/2020 1438   HDL 94.60 11/05/2020 1438   CHOLHDL 2 11/05/2020 1438   VLDL 12.2 11/05/2020 1438   LDLCALC 61 11/05/2020 1438      Exam:    Vital Signs:  BP (!) 191/101   Pulse 87   Ht 5' (1.524 m)   Wt 87 lb (39.5 kg)   BMI 16.99 kg/m     Wt Readings from Last 3 Encounters:  01/07/21 87 lb (39.5 kg)  11/05/20 95 lb 9.6 oz (43.4 kg)  02/05/20 120 lb (54.4 kg)     Well nourished, well developed in no acute distress. Alert awake and x3 not in distress talking to me very happy that she is able to do a virtual visit.  Diagnosis for this visit:   1. Essential hypertension   2. Bradycardia   3. Depression, major, single episode, mild ([place])   4. Dyspnea on exertion      ASSESSMENT & PLAN:    1.  Essential hypertension well-controlled on appropriate medications which I will continue. 2.  Bradycardia.  We will discontinue her beta-blocker since that time we have no problem.  She denies having any dizziness passing out.  There is no exertional  shortness of breath more than usual.  We will continue monitoring.  I warned her about signs and symptoms of worsening of bradycardia including dizziness passing out she will let me know if she develop any of this. 3.  Depression only 1 episode doing well from that point review in spite of quite difficult situation at home. 4.  Dyspnea on exertion stable.  COVID-19 Education: The signs and symptoms of COVID-19 were discussed with the patient and how to seek care for testing (follow up with PCP or arrange E-visit).  The importance of social distancing was discussed today.  Patient Risk:   After full review of this patients clinical status, I feel that they are at least moderate risk at this time.  Time:   Today, I have spent 15 minutes with the patient with telehealth technology discussing pt health issues.  I spent 15 10:12 AM minutes reviewing her chart before the visit.  Visit was finished at 10:12 AM.    Medication Adjustments/Labs and Tests Ordered: Current medicines are reviewed at length with the patient today.  Concerns regarding medicines are outlined above.  No orders of the defined types were placed in this encounter.  Medication changes: No orders of the defined types were placed in this encounter.    Disposition: Follow-up in 6 months  Signed, Park Liter, MD, [facility] 01/07/2021 10:09 AM    [place]

## 2021-01-07 NOTE — Patient Instructions (Signed)

## 2021-02-22 ENCOUNTER — Other Ambulatory Visit: Payer: Self-pay | Admitting: Cardiology

## 2021-03-16 DIAGNOSIS — H40013 Open angle with borderline findings, low risk, bilateral: Secondary | ICD-10-CM | POA: Diagnosis not present

## 2021-03-16 DIAGNOSIS — H26493 Other secondary cataract, bilateral: Secondary | ICD-10-CM | POA: Diagnosis not present

## 2021-04-25 ENCOUNTER — Other Ambulatory Visit: Payer: Self-pay | Admitting: Cardiology

## 2021-05-11 ENCOUNTER — Encounter: Payer: Self-pay | Admitting: Family Medicine

## 2021-05-11 ENCOUNTER — Ambulatory Visit (INDEPENDENT_AMBULATORY_CARE_PROVIDER_SITE_OTHER): Payer: Medicare Other | Admitting: Family Medicine

## 2021-05-11 ENCOUNTER — Other Ambulatory Visit: Payer: Self-pay

## 2021-05-11 VITALS — BP 160/90 | HR 111 | Temp 98.2°F | Resp 18 | Ht 60.0 in | Wt 98.2 lb

## 2021-05-11 DIAGNOSIS — E2839 Other primary ovarian failure: Secondary | ICD-10-CM

## 2021-05-11 DIAGNOSIS — Z23 Encounter for immunization: Secondary | ICD-10-CM | POA: Diagnosis not present

## 2021-05-11 DIAGNOSIS — M810 Age-related osteoporosis without current pathological fracture: Secondary | ICD-10-CM

## 2021-05-11 DIAGNOSIS — M1990 Unspecified osteoarthritis, unspecified site: Secondary | ICD-10-CM | POA: Diagnosis not present

## 2021-05-11 DIAGNOSIS — I10 Essential (primary) hypertension: Secondary | ICD-10-CM | POA: Diagnosis not present

## 2021-05-11 DIAGNOSIS — F320 Major depressive disorder, single episode, mild: Secondary | ICD-10-CM

## 2021-05-11 DIAGNOSIS — F419 Anxiety disorder, unspecified: Secondary | ICD-10-CM

## 2021-05-11 DIAGNOSIS — K589 Irritable bowel syndrome without diarrhea: Secondary | ICD-10-CM | POA: Diagnosis not present

## 2021-05-11 MED ORDER — HYOSCYAMINE SULFATE SL 0.125 MG SL SUBL: 1.0000 | SUBLINGUAL_TABLET | Freq: Four times a day (QID) | SUBLINGUAL | 1 refills | Status: DC | PRN

## 2021-05-11 NOTE — Assessment & Plan Note (Signed)
Slightly worse due to her husbands med condition

## 2021-05-11 NOTE — Patient Instructions (Signed)
Diet for Irritable Bowel Syndrome When you have irritable bowel syndrome (IBS), it is very important to eat the foods and follow the eating habits that are best for your condition. IBS may cause various symptoms such as pain in the abdomen, constipation, or diarrhea. Choosing the right foods can help to ease the discomfort from these symptoms. Work with your health care provider and diet and nutrition specialist (dietitian) to find the eating plan that will help to control your symptoms. What are tips for following this plan?   Keep a food diary. This will help you identify foods that cause symptoms. Write down: What you eat and when you eat it. What symptoms you have. When symptoms occur in relation to your meals, such as "pain in abdomen 2 hours after dinner." Eat your meals slowly and in a relaxed setting. Aim to eat 5-6 small meals per day. Do not skip meals. Drink enough fluid to keep your urine pale yellow. Ask your health care provider if you should take an over-the-counter probiotic to help restore healthy bacteria in your gut (digestive tract). Probiotics are foods that contain good bacteria and yeasts. Your dietitian may have specific dietary recommendations for you based on your symptoms. He or she may recommend that you: Avoid foods that cause symptoms. Talk with your dietitian about other ways to get the same nutrients that are in those problem foods. Avoid foods with gluten. Gluten is a protein that is found in rye, wheat, and barley. Eat more foods that contain soluble fiber. Examples of foods with high soluble fiber include oats, seeds, and certain fruits and vegetables. Take a fiber supplement if directed by your dietitian. Reduce or avoid certain foods called FODMAPs. These are foods that contain carbohydrates that are hard to digest. Ask your doctor which foods contain these carbohydrates. What foods are not recommended? The following are some foods and drinks that may make your  symptoms worse: Fatty foods, such as french fries. Foods that contain gluten, such as pasta and cereal. Dairy products, such as milk, cheese, and ice cream. Chocolate. Alcohol. Products with caffeine, such as coffee. Carbonated drinks, such as soda. Foods that are high in FODMAPs. These include certain fruits and vegetables. Products with sweeteners such as honey, high fructose corn syrup, sorbitol, and mannitol. The items listed above may not be a complete list of foods and beverages you should avoid. Contact a dietitian for more information. What foods are good sources of fiber? Your health care provider or dietitian may recommend that you eat more foods that contain fiber. Fiber can help to reduce constipation and other IBS symptoms. Add foods with fiber to your diet a little at a time so your body can get used to them. Too much fiber at one time might cause gas and swelling of your abdomen. The following are some foods that are good sources of fiber: Berries, such as raspberries, strawberries, and blueberries. Tomatoes. Carrots. Brown rice. Oats. Seeds, such as chia and pumpkin seeds. The items listed above may not be a complete list of recommended sources of fiber. Contact your dietitian for more options. Where to find more information International Foundation for Functional Gastrointestinal Disorders: www.iffgd.[employer] of Diabetes and Digestive and Kidney Diseases: [employer] Summary When you have irritable bowel syndrome (IBS), it is very important to eat the foods and follow the eating habits that are best for your condition. IBS may cause various symptoms such as pain in the abdomen, constipation, or diarrhea. Choosing the right  foods can help to ease the discomfort that comes from symptoms. Keep a food diary. This will help you identify foods that cause symptoms. Your health care provider or diet and nutrition specialist (dietitian) may recommend that you  eat more foods that contain fiber. This information is not intended to replace advice given to you by your health care provider. Make sure you discuss any questions you have with your health care provider. Document Revised: 01/23/2020 Document Reviewed: 01/30/2020 Elsevier Patient Education  2022 [employer].

## 2021-05-11 NOTE — Assessment & Plan Note (Signed)
Refill levsin  Consider GI if no improvement

## 2021-05-11 NOTE — Progress Notes (Signed)
Established Patient Office Visit  Subjective:  Patient ID: Cassidy Brown, female    DOB: [birth year]  Age: 87 y.o. MRN: [id number]  CC:  Chief Complaint  Patient presents with   Hypertension   Hyperlipidemia   Follow-up    HPI Cassidy Brown presents for f/u bp and cholesterol   she is struggling some with her ibs and would like a refill on her levsin--- she does not want to see GI right now.    Past Medical History:  Diagnosis Date   Allergy    Anxiety 07/20/2017   Backache 06/15/2010   Qualifier: Diagnosis of  By: Jerold Coombe     Bleeding nose 08/03/2017   Last Assessment & Plan:  Formatting of this note might be different from the original. Concern over nosebleeds. Chronic history of intermittent bleeding primarily from the right nostril.  Has never required packing or cautery.  No other anticoagulant therapy. EXAM shows excoriated area right anterior nasal septum. PLAN: Cauterized right nasal septum with silver nitrate.  Follow-up as needed.   Bradycardia 02/25/2019   BREAST CANCER, HX OF 03/07/2008   Qualifier: Diagnosis of  By: Jerold Coombe     Cancer [facility])    breast,hx of   Diverticulosis    DIVERTICULOSIS, COLON 10/13/2006   Qualifier: Diagnosis of  By: Jerold Coombe     DIZZINESS OR VERTIGO 10/13/2006   Qualifier: Diagnosis of  By: Jerold Coombe     Dyspnea on exertion 02/25/2019   Essential hypertension 04/02/2017   Glaucoma    Hypertension    HYPERTENSION 10/13/2006   Qualifier: Diagnosis of  By: Etter Sjogren DO,      Loose stools 04/30/2018   Osteoporosis    OSTEOPOROSIS 10/13/2006   Qualifier: Diagnosis of  By: Jerold Coombe     Pansinusitis 04/02/2017   Poliomyelitis    POLIOMYELITIS, HX OF 11/20/2007   Qualifier: Diagnosis of  By: Jerold Coombe     SCOLIOSIS 11/20/2007   Qualifier: Diagnosis of  By: Jerold Coombe     Sensation of fullness in ear 08/03/2017   Last Assessment & Plan:  Formatting of this note might be different from the original.  Concern over fullness at the right ear. 79-month history of fullness at the right ear.  She cannot use topical nasal steroid sprays due to epistaxis.  She is not improving. EXAM shows decreased movement of the right tympanic membrane on insufflation.  Left moves well.  Weber tuning fork lateralizes of the right e   Vertigo    dizziness    Past Surgical History:  Procedure Laterality Date   BREAST SURGERY Left    Lumpectomy   DILATION AND CURETTAGE OF UTERUS     x3   ganglion cyst r wrist     LUMBAR LAMINECTOMY     SPINE SURGERY  11/2010   fusion--jenkins    Family History  Problem Relation Age of Onset   Hypertension Father    Kidney disease Father    Hypertension Sister    Coronary artery disease Other        P uncles    Social History   Socioeconomic History   Marital status: Married    Spouse name: Not on file   Number of children: Not on file   Years of education: Not on file   Highest education level: Not on file  Occupational History   Occupation: [employer] --retired    Fish farm manager:  RETIRED  Tobacco Use   Smoking status: Never   Smokeless tobacco: Never  Substance and Sexual Activity   Alcohol use: No   Drug use: No   Sexual activity: Never    Partners: Male  Other Topics Concern   Not on file  Social History Narrative   Exercise-- walking 1 x a week   Social Determinants of Health   Financial Resource Strain: Not on file  Food Insecurity: Not on file  Transportation Needs: Not on file  Physical Activity: Not on file  Stress: Not on file  Social Connections: Not on file  Intimate Partner Violence: Not on file    Outpatient Medications Prior to Visit  Medication Sig Dispense Refill   b complex vitamins tablet Take 1 tablet by mouth daily. Unknown strength     calcium citrate-vitamin D (CITRACAL+D) 315-200 MG-UNIT per tablet Take 1 tablet by mouth 3 (three) times daily.     diltiazem (CARDIZEM CD) 360 MG 24 hr capsule Take 1 capsule (360 mg  total) by mouth daily. Needs appointment for future refill 90 capsule 2   irbesartan (AVAPRO) 300 MG tablet TAKE 1 TABLET BY MOUTH  DAILY (Patient taking differently: Take 300 mg by mouth daily. TAKE 1 TABLET BY MOUTH  DAILY) 90 tablet 3   latanoprost (XALATAN) 0.005 % ophthalmic solution Place 1 drop into both eyes at bedtime.     loratadine (CLARITIN) 10 MG tablet Take 10 mg by mouth daily as needed for allergies.     LORazepam (ATIVAN) 1 MG tablet TAKE 1 TABLET EVERY 8 HOURS (Patient taking differently: Take 1 mg by mouth as needed for anxiety. TAKE 1 TABLET EVERY 8 HOURS) 60 tablet 0   Magnesium Citrate 100 MG TABS Take 150 mg by mouth daily.     MAGNESIUM PO Take 500 mg by mouth daily.     meclizine (ANTIVERT) 25 MG tablet Take 1 tablet (25 mg total) by mouth 3 (three) times daily as needed. (Patient taking differently: Take 25 mg by mouth 3 (three) times daily as needed for dizziness.) 60 tablet 1   multivitamin-lutein (OCUVITE-LUTEIN) CAPS Take 1 capsule by mouth daily. Unknown strength     NONFORMULARY OR COMPOUNDED ITEM Light weight walker #1  As directed    Dx--balance issues,  Osteoporosis, dizziness 1 each 0   spironolactone (ALDACTONE) 25 MG tablet Take 0.5 tablets (12.5 mg total) by mouth daily. 45 tablet 1   cholecalciferol (VITAMIN D) 1000 UNITS tablet Take 1,000 Units by mouth daily. (Patient not taking: Reported on 01/07/2021)     Misc Natural Products (LUTEIN 20 PO) Take 1 capsule by mouth daily. (Patient not taking: Reported on 01/07/2021)     NONFORMULARY OR COMPOUNDED ITEM Walker  Dx osteoarthritis, weakness    Use as directed (Patient not taking: Reported on 01/07/2021) 1 each 0   No facility-administered medications prior to visit.    Allergies  Allergen Reactions   Amoxicillin Diarrhea    "I had diarrhea really bad."   Coreg [Carvedilol] Diarrhea   Hydrocodone    Plendil [Felodipine]     Caused pains in back and stomach and caused constipation   Propoxyphene  N-Acetaminophen    Sertraline Hcl Other (See Comments)    Per patient shaking, Headache and weird feeling in her eyes.   Tenex [Guanfacine Hcl]     Made patient feel really bad and was ineffective in decreasing BP    ROS Review of Systems  Constitutional:  Negative for appetite change,  diaphoresis, fatigue and unexpected weight change.  Eyes:  Negative for pain, redness and visual disturbance.  Respiratory:  Negative for cough, chest tightness, shortness of breath and wheezing.   Cardiovascular:  Negative for chest pain, palpitations and leg swelling.  Gastrointestinal:  Positive for abdominal pain and diarrhea.       Ibs hx  Endocrine: Negative for cold intolerance, heat intolerance, polydipsia, polyphagia and polyuria.  Genitourinary:  Negative for difficulty urinating, dysuria and frequency.  Musculoskeletal:  Positive for arthralgias, back pain and gait problem.  Neurological:  Negative for dizziness, light-headedness, numbness and headaches.     Objective:    Physical Exam Vitals and nursing note reviewed.  Constitutional:      Appearance: She is well-developed.  HENT:     Head: Normocephalic and atraumatic.  Eyes:     Conjunctiva/sclera: Conjunctivae normal.  Neck:     Thyroid: No thyromegaly.     Vascular: No carotid bruit or JVD.  Cardiovascular:     Rate and Rhythm: Normal rate and regular rhythm.     Heart sounds: Normal heart sounds. No murmur heard. Pulmonary:     Effort: Pulmonary effort is normal. No respiratory distress.     Breath sounds: Normal breath sounds. No wheezing or rales.  Chest:     Chest wall: No tenderness.  Musculoskeletal:     Cervical back: Normal range of motion and neck supple.  Neurological:     Mental Status: She is alert and oriented to person, place, and time.    BP (!) 160/90 (BP Location: Left Arm, Patient Position: Sitting, Cuff Size: Small)   Pulse (!) 111   Temp 98.2 F (36.8 C) (Oral)   Resp 18   Ht 5' (1.524 m)   Wt 98  lb 3.2 oz (44.5 kg)   SpO2 96%   BMI 19.18 kg/m  Wt Readings from Last 3 Encounters:  05/11/21 98 lb 3.2 oz (44.5 kg)  01/07/21 87 lb (39.5 kg)  11/05/20 95 lb 9.6 oz (43.4 kg)     Health Maintenance Due  Topic Date Due   TETANUS/TDAP  Never done   Zoster Vaccines- Shingrix (1 of 2) Never done   COVID-19 Vaccine (3 - Booster for Pfizer series) 09/18/2019   MAMMOGRAM  01/28/2021    There are no preventive care reminders to display for this patient.  Lab Results  Component Value Date   TSH 1.21 08/30/2017   Lab Results  Component Value Date   WBC 5.1 11/05/2020   HGB 12.7 11/05/2020   HCT 37.2 11/05/2020   MCV 87.1 11/05/2020   PLT 268.0 11/05/2020   Lab Results  Component Value Date   NA 132 (L) 11/05/2020   K 4.8 11/05/2020   CO2 30 11/05/2020   GLUCOSE 113 (H) 11/05/2020   BUN 29 (H) 11/05/2020   CREATININE 0.70 11/05/2020   BILITOT 0.3 11/05/2020   ALKPHOS 63 11/05/2020   AST 22 11/05/2020   ALT 24 11/05/2020   PROT 6.4 11/05/2020   ALBUMIN 4.2 11/05/2020   CALCIUM 10.1 11/05/2020   ANIONGAP 8 08/29/2017   GFR 77.04 11/05/2020   Lab Results  Component Value Date   CHOL 168 11/05/2020   Lab Results  Component Value Date   HDL 94.60 11/05/2020   Lab Results  Component Value Date   LDLCALC 61 11/05/2020   Lab Results  Component Value Date   TRIG 61.0 11/05/2020   Lab Results  Component Value Date   CHOLHDL 2 11/05/2020  Lab Results  Component Value Date   HGBA1C 5.5 08/20/2013      Assessment & Plan:   Problem List Items Addressed This Visit       Unprioritized   Depression, major, single episode, mild ([place])   Anxiety    Slightly worse due to her husbands med condition      Irritable bowel syndrome - Primary    Refill levsin  Consider GI if no improvement       Relevant Medications   Hyoscyamine Sulfate SL (LEVSIN/SL) 0.125 MG SUBL   Other Relevant Orders   AMB Referral to [place]   Osteoporosis     bmd due      Other Visit Diagnoses     Need for influenza vaccination       Relevant Orders   Flu Vaccine QUAD High Dose(Fluad) (Completed)   Estrogen deficiency       Relevant Orders   DG Bone Density   Primary hypertension       Relevant Orders   AMB Referral to [facility]   Lipid panel   Comprehensive metabolic panel   CBC with Differential/Platelet   Osteoarthritis, unspecified osteoarthritis type, unspecified site       Relevant Orders   AMB Referral to [place] ordered this encounter  Medications   Hyoscyamine Sulfate SL (LEVSIN/SL) 0.125 MG SUBL    Sig: Place 1 tablet under the tongue every 6 (six) hours as needed.    Dispense:  60 tablet    Refill:  1    Follow-up: Return in about 6 months (around 11/08/2021).    [name], DO

## 2021-05-11 NOTE — Assessment & Plan Note (Signed)
Poorly controlled will alter medications, encouraged DASH diet, minimize caffeine and obtain adequate sleep. Report concerning symptoms and follow up as directed and as needed Pt has not taken one of her meds today

## 2021-05-11 NOTE — Assessment & Plan Note (Signed)
bmd due

## 2021-05-12 ENCOUNTER — Telehealth: Payer: Self-pay | Admitting: *Deleted

## 2021-05-12 LAB — COMPREHENSIVE METABOLIC PANEL
ALT: 21 U/L (ref 0–35)
AST: 23 U/L (ref 0–37)
Albumin: 4.4 g/dL (ref 3.5–5.2)
Alkaline Phosphatase: 61 U/L (ref 39–117)
BUN: 20 mg/dL (ref 6–23)
CO2: 30 mEq/L (ref 19–32)
Calcium: 10.2 mg/dL (ref 8.4–10.5)
Chloride: 93 mEq/L — ABNORMAL LOW (ref 96–112)
Creatinine, Ser: 0.84 mg/dL (ref 0.40–1.20)
GFR: 61.68 mL/min (ref >60.00)
Glucose, Bld: 107 mg/dL — ABNORMAL HIGH (ref 70–99)
Potassium: 4.6 mEq/L (ref 3.5–5.1)
Sodium: 130 mEq/L — ABNORMAL LOW (ref 135–145)
Total Bilirubin: 0.4 mg/dL (ref 0.2–1.2)
Total Protein: 7 g/dL (ref 6.0–8.3)

## 2021-05-12 LAB — LIPID PANEL
Cholesterol: 183 mg/dL (ref 0–200)
HDL: 101.6 mg/dL (ref >39.00)
LDL Cholesterol: 61 mg/dL (ref 0–99)
NonHDL: 81.72
Total CHOL/HDL Ratio: 2
Triglycerides: 104 mg/dL (ref 0.0–149.0)
VLDL: 20.8 mg/dL (ref 0.0–40.0)

## 2021-05-12 LAB — CBC WITH DIFFERENTIAL/PLATELET
Basophils Absolute: 0.1 10*3/uL (ref 0.0–0.1)
Basophils Relative: 1 % (ref 0.0–3.0)
Eosinophils Absolute: 0 10*3/uL (ref 0.0–0.7)
Eosinophils Relative: 0.7 % (ref 0.0–5.0)
HCT: 39.9 % (ref 36.0–46.0)
Hemoglobin: 13.4 g/dL (ref 12.0–15.0)
Lymphocytes Relative: 14.3 % (ref 12.0–46.0)
Lymphs Abs: 0.7 10*3/uL (ref 0.7–4.0)
MCHC: 33.6 g/dL (ref 30.0–36.0)
MCV: 87.4 fl (ref 78.0–100.0)
Monocytes Absolute: 0.5 10*3/uL (ref 0.1–1.0)
Monocytes Relative: 10.3 % (ref 3.0–12.0)
Neutro Abs: 3.8 10*3/uL (ref 1.4–7.7)
Neutrophils Relative %: 73.7 % (ref 43.0–77.0)
Platelets: 287 10*3/uL (ref 150.0–400.0)
RBC: 4.57 Mil/uL (ref 3.87–5.11)
RDW: 14.9 % (ref 11.5–15.5)
WBC: 5.2 10*3/uL (ref 4.0–10.5)

## 2021-05-12 NOTE — Chronic Care Management (AMB) (Signed)
  Chronic Care Management   Note  05/12/2021 Name: LAJOYA DOMBEK MRN: [id number] DOB: September 14, 1929  [name] is a 87 y.o. year old female who is a primary care patient of [name], DO. I reached out to [employer] by phone today in response to a referral sent by Ms. Tovah A Andrew's PCP.  Ms. Rocca was given information about Chronic Care Management services today including:  CCM service includes personalized support from designated clinical staff supervised by her physician, including individualized plan of care and coordination with other care providers 24/7 contact phone numbers for assistance for urgent and routine care needs. Service will only be billed when office clinical staff spend 20 minutes or more in a month to coordinate care. Only one practitioner may furnish and bill the service in a calendar month. The patient may stop CCM services at any time (effective at the end of the month) by phone call to the office staff. The patient is responsible for co-pay (up to 20% after annual deductible is met) if co-pay is required by the individual health plan.   Patient agreed to services and verbal consent obtained.   Follow up plan: Telephone appointment with care management team member scheduled for: 06/22/2021 (pt requested January appt)  Julian Hy, [facility] [facility]  Direct Dial: [phone]

## 2021-05-18 ENCOUNTER — Other Ambulatory Visit: Payer: Self-pay | Admitting: Family Medicine

## 2021-05-18 ENCOUNTER — Telehealth: Payer: Self-pay | Admitting: Family Medicine

## 2021-05-18 DIAGNOSIS — K589 Irritable bowel syndrome without diarrhea: Secondary | ICD-10-CM

## 2021-05-18 MED ORDER — DICYCLOMINE HCL 20 MG PO TABS: 20.0000 mg | ORAL_TABLET | Freq: Three times a day (TID) | ORAL | 1 refills | Status: DC

## 2021-05-18 NOTE — Telephone Encounter (Signed)
Pt. Called and stated that her insurance will not cover the medication below and that she will need a different kind sent in.  Hyoscyamine Sulfate SL (LEVSIN/SL) 0.125 MG SUBL

## 2021-05-19 MED ORDER — DICYCLOMINE HCL 20 MG PO TABS: 20.0000 mg | ORAL_TABLET | Freq: Three times a day (TID) | ORAL | 1 refills | Status: AC

## 2021-05-19 NOTE — Telephone Encounter (Signed)
Pt made aware. Rx resent to [facility] per pt request

## 2021-06-16 ENCOUNTER — Telehealth: Payer: Self-pay | Admitting: *Deleted

## 2021-06-16 NOTE — Chronic Care Management (AMB) (Signed)
°  Care Management   Note  06/16/2021 Name: TRANIYAH HALLETT MRN: [id number] DOB: Nov 16, 1929  [name] is a 88 y.o. year old female who is a primary care patient of [name], DO and is actively engaged with the care management team. I reached out to [employer] by phone today to assist with re-scheduling an initial visit with the Licensed Clinical Social Worker  Follow up plan: Patient declines further follow up and engagement by the care management team. Appropriate care team members and provider have been notified via electronic communication.   Julian Hy, [place] Management  Direct Dial: [phone]

## 2021-06-21 ENCOUNTER — Telehealth: Payer: Medicare Other

## 2021-06-22 ENCOUNTER — Telehealth: Payer: Medicare Other

## 2021-07-12 ENCOUNTER — Ambulatory Visit: Payer: Medicare Other | Admitting: Cardiology

## 2021-07-30 DIAGNOSIS — H43813 Vitreous degeneration, bilateral: Secondary | ICD-10-CM | POA: Diagnosis not present

## 2021-07-30 DIAGNOSIS — H524 Presbyopia: Secondary | ICD-10-CM | POA: Diagnosis not present

## 2021-07-30 DIAGNOSIS — H40013 Open angle with borderline findings, low risk, bilateral: Secondary | ICD-10-CM | POA: Diagnosis not present

## 2021-07-30 DIAGNOSIS — H26493 Other secondary cataract, bilateral: Secondary | ICD-10-CM | POA: Diagnosis not present

## 2021-08-05 ENCOUNTER — Other Ambulatory Visit: Payer: Self-pay | Admitting: Cardiology

## 2021-08-24 DIAGNOSIS — H26492 Other secondary cataract, left eye: Secondary | ICD-10-CM | POA: Diagnosis not present

## 2021-09-07 DIAGNOSIS — H26491 Other secondary cataract, right eye: Secondary | ICD-10-CM | POA: Diagnosis not present

## 2021-09-21 ENCOUNTER — Ambulatory Visit: Payer: Medicare Other

## 2021-09-21 ENCOUNTER — Telehealth: Payer: Self-pay

## 2021-09-21 NOTE — Progress Notes (Deleted)
? ?Subjective:  ? Cassidy Brown is a 88 y.o. female who presents for an Initial Medicare Annual Wellness Visit. ? ?I connected with [name] today by telephone and verified that I am speaking with the correct person using two identifiers. ?Location patient: home ?Location provider: work ?Persons participating in the virtual visit: patient, nurse.  ?  ?I discussed the limitations, risks, security and privacy concerns of performing an evaluation and management service by telephone and the availability of in person appointments. I also discussed with the patient that there may be a patient responsible charge related to this service. The patient expressed understanding and verbally consented to this telephonic visit.  ?  ?Interactive audio and video telecommunications were attempted between this provider and patient, however failed, due to patient having technical difficulties OR patient did not have access to video capability.  We continued and completed visit with audio only. ? ?Some vital signs may be absent or patient reported.  ? ?Time Spent with patient on telephone encounter: *** minutes ? ? ?Review of Systems    ?*** ?  ? ?   ?Objective:  ?  ?There were no vitals filed for this visit. ?There is no height or weight on file to calculate BMI. ? ? ?  02/05/2020  ?  4:56 PM 08/29/2017  ? 12:10 PM 03/09/2012  ?  6:32 AM  ?Advanced Directives  ?Does Patient Have a Medical Advance Directive? Yes No Patient does not have advance directive  ?Pre-existing out of facility DNR order (yellow form or pink MOST form)   No  ? ? ?Current Medications (verified) ?Outpatient Encounter Medications as of 09/21/2021  ?Medication Sig  ? b complex vitamins tablet Take 1 tablet by mouth daily. Unknown strength  ? calcium citrate-vitamin D (CITRACAL+D) 315-200 MG-UNIT per tablet Take 1 tablet by mouth 3 (three) times daily.  ? dicyclomine (BENTYL) 20 MG tablet Take 1 tablet (20 mg total) by mouth 4 (four) times daily -  before meals and at  bedtime.  ? diltiazem (CARDIZEM CD) 360 MG 24 hr capsule Take 1 capsule (360 mg total) by mouth daily.  ? irbesartan (AVAPRO) 300 MG tablet TAKE 1 TABLET BY MOUTH  DAILY (Patient taking differently: Take 300 mg by mouth daily. TAKE 1 TABLET BY MOUTH  DAILY)  ? latanoprost (XALATAN) 0.005 % ophthalmic solution Place 1 drop into both eyes at bedtime.  ? loratadine (CLARITIN) 10 MG tablet Take 10 mg by mouth daily as needed for allergies.  ? LORazepam (ATIVAN) 1 MG tablet TAKE 1 TABLET EVERY 8 HOURS (Patient taking differently: Take 1 mg by mouth as needed for anxiety. TAKE 1 TABLET EVERY 8 HOURS)  ? Magnesium Citrate 100 MG TABS Take 150 mg by mouth daily.  ? MAGNESIUM PO Take 500 mg by mouth daily.  ? meclizine (ANTIVERT) 25 MG tablet Take 1 tablet (25 mg total) by mouth 3 (three) times daily as needed. (Patient taking differently: Take 25 mg by mouth 3 (three) times daily as needed for dizziness.)  ? multivitamin-lutein (OCUVITE-LUTEIN) CAPS Take 1 capsule by mouth daily. Unknown strength  ? NONFORMULARY OR COMPOUNDED ITEM Light weight walker #1  As directed    Dx--balance issues,  Osteoporosis, dizziness  ? spironolactone (ALDACTONE) 25 MG tablet Take 0.5 tablets (12.5 mg total) by mouth daily.  ? ?No facility-administered encounter medications on file as of 09/21/2021.  ? ? ?Allergies (verified) ?Amoxicillin, Coreg [carvedilol], Hydrocodone, Plendil [felodipine], Propoxyphene n-acetaminophen, Sertraline hcl, and Tenex [guanfacine hcl]  ? ?History: ?  Past Medical History:  ?Diagnosis Date  ? Allergy   ? Anxiety 07/20/2017  ? Backache 06/15/2010  ? Qualifier: Diagnosis of  By: Jerold Coombe    ? Bleeding nose 08/03/2017  ? Last Assessment & Plan:  Formatting of this note might be different from the original. Concern over nosebleeds. Chronic history of intermittent bleeding primarily from the right nostril.  Has never required packing or cautery.  No other anticoagulant therapy. EXAM shows excoriated area right anterior  nasal septum. PLAN: Cauterized right nasal septum with silver nitrate.  Follow-up as needed.  ? Bradycardia 02/25/2019  ? BREAST CANCER, HX OF 03/07/2008  ? Qualifier: Diagnosis of  By: Jerold Coombe    ? Cancer [facility])   ? breast,hx of  ? Diverticulosis   ? DIVERTICULOSIS, COLON 10/13/2006  ? Qualifier: Diagnosis of  By: Jerold Coombe    ? DIZZINESS OR VERTIGO 10/13/2006  ? Qualifier: Diagnosis of  By: Jerold Coombe    ? Dyspnea on exertion 02/25/2019  ? Essential hypertension 04/02/2017  ? Glaucoma   ? Hypertension   ? HYPERTENSION 10/13/2006  ? Qualifier: Diagnosis of  By: Jerold Coombe    ? Loose stools 04/30/2018  ? Osteoporosis   ? OSTEOPOROSIS 10/13/2006  ? Qualifier: Diagnosis of  By: Jerold Coombe    ? Pansinusitis 04/02/2017  ? Poliomyelitis   ? POLIOMYELITIS, HX OF 11/20/2007  ? Qualifier: Diagnosis of  By: Jerold Coombe    ? SCOLIOSIS 11/20/2007  ? Qualifier: Diagnosis of  By: Jerold Coombe    ? Sensation of fullness in ear 08/03/2017  ? Last Assessment & Plan:  Formatting of this note might be different from the original. Concern over fullness at the right ear. 78-monthhistory of fullness at the right ear.  She cannot use topical nasal steroid sprays due to epistaxis.  She is not improving. EXAM shows decreased movement of the right tympanic membrane on insufflation.  Left moves well.  Weber tuning fork lateralizes of the right e  ? Vertigo   ? dizziness  ? ?Past Surgical History:  ?Procedure Laterality Date  ? BREAST SURGERY Left   ? Lumpectomy  ? DILATION AND CURETTAGE OF UTERUS    ? x3  ? ganglion cyst r wrist    ? LUMBAR LAMINECTOMY    ? SPINE SURGERY  11/2010  ? fusion--jenkins  ? ?Family History  ?Problem Relation Age of Onset  ? Hypertension Father   ? Kidney disease Father   ? Hypertension Sister   ? Coronary artery disease Other   ?     P uncles  ? ?Social History  ? ?Socioeconomic History  ? Marital status: Married  ?  Spouse name: Not on file  ? Number of children: Not on file  ? Years of  education: Not on file  ? Highest education level: Not on file  ?Occupational History  ? Occupation: [employer] --retired  ?  Employer: RETIRED  ?Tobacco Use  ? Smoking status: Never  ? Smokeless tobacco: Never  ?Substance and Sexual Activity  ? Alcohol use: No  ? Drug use: No  ? Sexual activity: Never  ?  Partners: Male  ?Other Topics Concern  ? Not on file  ?Social History Narrative  ? Exercise-- walking 1 x a week  ? ?Social Determinants of Health  ? ?Financial Resource Strain: Not on file  ?Food Insecurity: Not on file  ?Transportation Needs: Not on file  ?  Physical Activity: Not on file  ?Stress: Not on file  ?Social Connections: Not on file  ? ? ?Tobacco Counseling ?Counseling given: Not Answered ? ? ?Clinical Intake: ? ?  ? ?  ? ?  ? ?  ? ?  ? ?Diabetic?No ? ?  ? ?  ? ? ?Activities of Daily Living ? ?  05/11/2021  ?  1:14 PM  ?In your present state of health, do you have any difficulty performing the following activities:  ?Hearing? 1  ?Vision? 0  ?Difficulty concentrating or making decisions? 0  ?Walking or climbing stairs? 1  ?Dressing or bathing? 0  ?Doing errands, shopping? 1  ? ? ?Patient Care Team: ?Carollee Herter, Alferd Apa, DO as PCP - General ?Park Liter, MD as PCP - Cardiology (Cardiology) ?Marygrace Drought, MD as Consulting Physician (Ophthalmology) ? ?Indicate any recent Medical Services you may have received from other than [facility] providers in the past year (date may be approximate). ? ?   ?Assessment:  ? This is a routine wellness examination for [facility]. ? ?Hearing/Vision screen ?No results found. ? ?Dietary issues and exercise activities discussed: ?  ? ? Goals Addressed   ?None ?  ? ?Depression Screen ? ?  11/05/2020  ?  2:07 PM 11/05/2019  ?  1:01 PM 01/02/2018  ?  2:26 PM 02/17/2017  ?  2:30 PM 12/22/2015  ?  9:44 AM 09/02/2014  ? 11:45 AM 08/20/2013  ?  9:58 AM  ?PHQ 2/9 Scores  ?PHQ - 2 Score 0 0 0 0 0 0 0  ?Exception Documentation    Patient refusal     ?  ?Fall Risk ? ?  05/11/2021  ?   1:13 PM 11/05/2020  ?  2:07 PM 11/05/2019  ?  1:00 PM 01/02/2018  ?  2:26 PM 02/17/2017  ?  2:30 PM  ?Fall Risk   ?Falls in the past year? 0 1 1 No Yes  ?Number falls in past yr: 0 0 0  1  ?Injury with Fall? 0 1 1

## 2021-09-21 NOTE — Telephone Encounter (Signed)
Called patient to completed Medicare wellness exam. Patient states she does not want to complete Medicare wellness exam. States she has never done it & has already told multiple people in the past that she does not want to do it. Appt canceled. ?

## 2021-10-13 ENCOUNTER — Other Ambulatory Visit: Payer: Self-pay | Admitting: Cardiology

## 2021-10-13 DIAGNOSIS — I10 Essential (primary) hypertension: Secondary | ICD-10-CM

## 2021-11-18 ENCOUNTER — Encounter: Payer: Self-pay | Admitting: Family Medicine

## 2021-11-18 ENCOUNTER — Ambulatory Visit (INDEPENDENT_AMBULATORY_CARE_PROVIDER_SITE_OTHER): Payer: Medicare Other | Admitting: Family Medicine

## 2021-11-18 VITALS — BP 138/80 | HR 84 | Temp 97.6°F | Resp 18 | Ht 61.0 in | Wt 100.6 lb

## 2021-11-18 DIAGNOSIS — F320 Major depressive disorder, single episode, mild: Secondary | ICD-10-CM | POA: Diagnosis not present

## 2021-11-18 DIAGNOSIS — I10 Essential (primary) hypertension: Secondary | ICD-10-CM

## 2021-11-18 DIAGNOSIS — M810 Age-related osteoporosis without current pathological fracture: Secondary | ICD-10-CM | POA: Diagnosis not present

## 2021-11-18 DIAGNOSIS — R531 Weakness: Secondary | ICD-10-CM | POA: Diagnosis not present

## 2021-11-18 DIAGNOSIS — R0609 Other forms of dyspnea: Secondary | ICD-10-CM

## 2021-11-18 MED ORDER — MIRTAZAPINE 7.5 MG PO TABS: 7.5000 mg | ORAL_TABLET | Freq: Every day | ORAL | 2 refills | Status: DC

## 2021-11-18 NOTE — Progress Notes (Signed)
Subjective:   By signing my name below, I, Cassidy Brown, attest that this documentation has been prepared under the direction and in the presence of Cassidy Held, DO  11/18/2021    Patient ID: Cassidy Brown, female    DOB: June 27, 1929, 88 y.o.   MRN: [id number]  Chief Complaint  Patient presents with   Hypertension   Follow-up    Hypertension Associated symptoms include headaches. Pertinent negatives include no blurred vision, chest pain, malaise/fatigue, palpitations or shortness of breath.   Patient is in today for a follow up visit. She is present with her daughter during this visit.   She complains of a headache since lunch today. She typically gets headaches off and on for the past few years since her back issues started.  Her blood pressure is doing well during this visit. She continues taking 360 mg Cardizem daily PO, 300 mg irbesartan daily PO, 25 mg aldactone daily PO and reports no new issues while taking them.  BP Readings from Last 3 Encounters:  11/18/21 138/80  05/11/21 (!) 160/90  01/07/21 (!) 191/101   Pulse Readings from Last 3 Encounters:  11/18/21 84  05/11/21 (!) 111  01/07/21 87   She complains of feeling weak and out of breath easily for the past month. Her daughter reports her right leg has lost strength. She has worse balance. She requires using her walker of a wall to stabilize herself. She is depending on her walker more. She now leads with her left foot when walking instead of right. She denies her back pain worsening. She has tried physical therapy in the past and is interested in trying it again. Her daughter reports her walking speed has slowed down to around 50%. Her daughter also reports patient is taking care of her husband with dementia and is waking up multiple times at night for his care. She thinks her weakness started prior to taking care of her husband but noticed it worsened faster after it started. She denies having any changes to her  memory, or tremors.  She is trying to make a follow up appointment with her cardiologist.  Her daughter is requesting she started taking medication to manage her depression and anxiety. She was prescribed these medication 2 years ago following a fall.    Past Medical History:  Diagnosis Date   Allergy    Anxiety 07/20/2017   Backache 06/15/2010   Qualifier: Diagnosis of  By: Jerold Coombe     Bleeding nose 08/03/2017   Last Assessment & Plan:  Formatting of this note might be different from the original. Concern over nosebleeds. Chronic history of intermittent bleeding primarily from the right nostril.  Has never required packing or cautery.  No other anticoagulant therapy. EXAM shows excoriated area right anterior nasal septum. PLAN: Cauterized right nasal septum with silver nitrate.  Follow-up as needed.   Bradycardia 02/25/2019   BREAST CANCER, HX OF 03/07/2008   Qualifier: Diagnosis of  By: Jerold Coombe     Cancer [facility])    breast,hx of   Diverticulosis    DIVERTICULOSIS, COLON 10/13/2006   Qualifier: Diagnosis of  By: Jerold Coombe     DIZZINESS OR VERTIGO 10/13/2006   Qualifier: Diagnosis of  By: Jerold Coombe     Dyspnea on exertion 02/25/2019   Essential hypertension 04/02/2017   Glaucoma    Hypertension    HYPERTENSION 10/13/2006   Qualifier: Diagnosis of  By: Jerold Coombe  Loose stools 04/30/2018   Osteoporosis    OSTEOPOROSIS 10/13/2006   Qualifier: Diagnosis of  By: Jerold Coombe     Pansinusitis 04/02/2017   Poliomyelitis    POLIOMYELITIS, HX OF 11/20/2007   Qualifier: Diagnosis of  By: Jerold Coombe     SCOLIOSIS 11/20/2007   Qualifier: Diagnosis of  By: Jerold Coombe     Sensation of fullness in ear 08/03/2017   Last Assessment & Plan:  Formatting of this note might be different from the original. Concern over fullness at the right ear. 11-monthhistory of fullness at the right ear.  She cannot use topical nasal steroid sprays due to epistaxis.  She is not  improving. EXAM shows decreased movement of the right tympanic membrane on insufflation.  Left moves well.  Weber tuning fork lateralizes of the right e   Vertigo    dizziness    Past Surgical History:  Procedure Laterality Date   BREAST SURGERY Left    Lumpectomy   DILATION AND CURETTAGE OF UTERUS     x3   ganglion cyst r wrist     LUMBAR LAMINECTOMY     SPINE SURGERY  11/2010   fusion--jenkins    Family History  Problem Relation Age of Onset   Hypertension Father    Kidney disease Father    Hypertension Sister    Coronary artery disease Other        P uncles    Social History   Socioeconomic History   Marital status: Married    Spouse name: Not on file   Number of children: Not on file   Years of education: Not on file   Highest education level: Not on file  Occupational History   Occupation: [employer] --retired    [employer] RETIRED  Tobacco Use   Smoking status: Never   Smokeless tobacco: Never  Substance and Sexual Activity   Alcohol use: No   Drug use: No   Sexual activity: Never    Partners: Male  Other Topics Concern   Not on file  Social History Narrative   Exercise-- walking 1 x a week   Social Determinants of Health   Financial Resource Strain: Not on file  Food Insecurity: Not on file  Transportation Needs: Not on file  Physical Activity: Not on file  Stress: Not on file  Social Connections: Not on file  Intimate Partner Violence: Not on file    Outpatient Medications Prior to Visit  Medication Sig Dispense Refill   b complex vitamins tablet Take 1 tablet by mouth daily. Unknown strength     calcium citrate-vitamin D (CITRACAL+D) 315-200 MG-UNIT per tablet Take 1 tablet by mouth 3 (three) times daily.     dicyclomine (BENTYL) 20 MG tablet Take 1 tablet (20 mg total) by mouth 4 (four) times daily -  before meals and at bedtime. 45 tablet 1   diltiazem (CARDIZEM CD) 360 MG 24 hr capsule Take 1 capsule (360 mg total) by mouth daily. 30  capsule 1   irbesartan (AVAPRO) 300 MG tablet Take 1 tablet (300 mg total) by mouth daily. 30 tablet 1   latanoprost (XALATAN) 0.005 % ophthalmic solution Place 1 drop into both eyes at bedtime.     loratadine (CLARITIN) 10 MG tablet Take 10 mg by mouth daily as needed for allergies.     LORazepam (ATIVAN) 1 MG tablet TAKE 1 TABLET EVERY 8 HOURS (Patient taking differently: Take 1 mg by mouth as  needed for anxiety. TAKE 1 TABLET EVERY 8 HOURS) 60 tablet 0   Magnesium Citrate 100 MG TABS Take 150 mg by mouth daily.     MAGNESIUM PO Take 500 mg by mouth daily.     meclizine (ANTIVERT) 25 MG tablet Take 1 tablet (25 mg total) by mouth 3 (three) times daily as needed. (Patient taking differently: Take 25 mg by mouth 3 (three) times daily as needed for dizziness.) 60 tablet 1   multivitamin-lutein (OCUVITE-LUTEIN) CAPS Take 1 capsule by mouth daily. Unknown strength     NONFORMULARY OR COMPOUNDED ITEM Light weight walker #1  As directed    Dx--balance issues,  Osteoporosis, dizziness 1 each 0   spironolactone (ALDACTONE) 25 MG tablet Take 0.5 tablets (12.5 mg total) by mouth daily. 15 tablet 1   No facility-administered medications prior to visit.    Allergies  Allergen Reactions   Amoxicillin Diarrhea    "I had diarrhea really bad."   Coreg [Carvedilol] Diarrhea   Hydrocodone    Plendil [Felodipine]     Caused pains in back and stomach and caused constipation   Propoxyphene N-Acetaminophen    Sertraline Hcl Other (See Comments)    Per patient shaking, Headache and weird feeling in her eyes.   Tenex [Guanfacine Hcl]     Made patient feel really bad and was ineffective in decreasing BP    Review of Systems  Constitutional:  Negative for fever and malaise/fatigue.  HENT:  Negative for congestion.   Eyes:  Negative for blurred vision.  Respiratory:  Negative for shortness of breath.   Cardiovascular:  Negative for chest pain, palpitations and leg swelling.  Gastrointestinal:  Negative for  abdominal pain, blood in stool and nausea.  Genitourinary:  Negative for dysuria and frequency.  Musculoskeletal:  Negative for falls.  Skin:  Negative for rash.  Neurological:  Positive for weakness (right leg) and headaches. Negative for dizziness, tremors and loss of consciousness.  Endo/Heme/Allergies:  Negative for environmental allergies.  Psychiatric/Behavioral:  Negative for depression and memory loss. The patient is not nervous/anxious.        Objective:    Physical Exam Vitals and nursing note reviewed.  Constitutional:      General: She is not in acute distress.    Appearance: Normal appearance. She is not ill-appearing.  HENT:     Head: Normocephalic and atraumatic.     Right Ear: External ear normal.     Left Ear: External ear normal.  Eyes:     Extraocular Movements: Extraocular movements intact.     Pupils: Pupils are equal, round, and reactive to light.  Cardiovascular:     Rate and Rhythm: Normal rate and regular rhythm.     Heart sounds: Normal heart sounds. No murmur heard.    No gallop.  Pulmonary:     Effort: Pulmonary effort is normal. No respiratory distress.     Breath sounds: Normal breath sounds. No wheezing or rales.  Musculoskeletal:     Comments: Walking slowly with walker  Skin:    General: Skin is warm and dry.  Neurological:     Mental Status: She is alert and oriented to person, place, and time.  Psychiatric:        Judgment: Judgment normal.     BP 138/80 (BP Location: Left Arm, Patient Position: Sitting, Cuff Size: Normal)   Pulse 84   Temp 97.6 F (36.4 C) (Oral)   Resp 18   Ht '5\' 1"'$  (1.549 m)  Wt 100 lb 9.6 oz (45.6 kg)   SpO2 97%   BMI 19.01 kg/m  Wt Readings from Last 3 Encounters:  11/18/21 100 lb 9.6 oz (45.6 kg)  05/11/21 98 lb 3.2 oz (44.5 kg)  01/07/21 87 lb (39.5 kg)    Diabetic Foot Exam - Simple   No data filed    Lab Results  Component Value Date   WBC 5.6 11/18/2021   HGB 12.7 11/18/2021   HCT 37.7  11/18/2021   PLT 293.0 11/18/2021   GLUCOSE 80 11/18/2021   CHOL 155 11/18/2021   TRIG 53.0 11/18/2021   HDL 99.30 11/18/2021   LDLCALC 45 11/18/2021   ALT 27 11/18/2021   AST 24 11/18/2021   NA 124 (L) 11/18/2021   K 4.8 11/18/2021   CL 91 (L) 11/18/2021   CREATININE 0.66 11/18/2021   BUN 22 11/18/2021   CO2 28 11/18/2021   TSH 1.70 11/18/2021   HGBA1C 5.5 08/20/2013   MICROALBUR 0.9 09/02/2014    Lab Results  Component Value Date   TSH 1.70 11/18/2021   Lab Results  Component Value Date   WBC 5.6 11/18/2021   HGB 12.7 11/18/2021   HCT 37.7 11/18/2021   MCV 87.5 11/18/2021   PLT 293.0 11/18/2021   Lab Results  Component Value Date   NA 124 (L) 11/18/2021   K 4.8 11/18/2021   CO2 28 11/18/2021   GLUCOSE 80 11/18/2021   BUN 22 11/18/2021   CREATININE 0.66 11/18/2021   BILITOT 0.4 11/18/2021   ALKPHOS 60 11/18/2021   AST 24 11/18/2021   ALT 27 11/18/2021   PROT 6.6 11/18/2021   ALBUMIN 4.1 11/18/2021   CALCIUM 9.5 11/18/2021   ANIONGAP 8 08/29/2017   GFR 77.57 11/18/2021   Lab Results  Component Value Date   CHOL 155 11/18/2021   Lab Results  Component Value Date   HDL 99.30 11/18/2021   Lab Results  Component Value Date   LDLCALC 45 11/18/2021   Lab Results  Component Value Date   TRIG 53.0 11/18/2021   Lab Results  Component Value Date   CHOLHDL 2 11/18/2021   Lab Results  Component Value Date   HGBA1C 5.5 08/20/2013       Assessment & Plan:   Problem List Items Addressed This Visit       Unprioritized   Weakness - Primary    Home health for pr / ot       Relevant Orders   Ambulatory referral to [place]   CBC with Differential/Platelet (Completed)   Comprehensive metabolic panel (Completed)   Lipid panel (Completed)   TSH (Completed)   Vitamin B12 (Completed)   VITAMIN D 25 Hydroxy (Vit-D Deficiency, Fractures) (Completed)   Essential hypertension    Well controlled, no changes to meds. Encouraged heart healthy diet  such as the DASH diet and exercise as tolerated.       Depression, major, single episode, mild (HCC)    Restart remeron  F/u 3 months       Relevant Medications   mirtazapine (REMERON) 7.5 MG tablet   Other Visit Diagnoses     DOE (dyspnea on exertion)       Relevant Orders   Ambulatory referral to [place]   Primary hypertension       Relevant Orders   CBC with Differential/Platelet (Completed)   Comprehensive metabolic panel (Completed)   Lipid panel (Completed)   TSH (Completed)   Vitamin B12 (Completed)   VITAMIN  D 25 Hydroxy (Vit-D Deficiency, Fractures) (Completed)        Meds ordered this encounter  Medications   mirtazapine (REMERON) 7.5 MG tablet    Sig: Take 1 tablet (7.5 mg total) by mouth at bedtime.    Dispense:  30 tablet    Refill:  2    I, Cassidy Held, DO, personally preformed the services described in this documentation.  All medical record entries made by the scribe were at my direction and in my presence.  I have reviewed the chart and discharge instructions (if applicable) and agree that the record reflects my personal performance and is accurate and complete. 11/18/2021   I,Cassidy Brown,acting as a scribe for Cassidy Held, DO.,have documented all relevant documentation on the behalf of Cassidy Held, DO,as directed by  Cassidy Held, DO while in the presence of Cassidy Held, DO.   Cassidy Held, DO

## 2021-11-18 NOTE — Patient Instructions (Signed)

## 2021-11-19 LAB — COMPREHENSIVE METABOLIC PANEL
ALT: 27 U/L (ref 0–35)
AST: 24 U/L (ref 0–37)
Albumin: 4.1 g/dL (ref 3.5–5.2)
Alkaline Phosphatase: 60 U/L (ref 39–117)
BUN: 22 mg/dL (ref 6–23)
CO2: 28 mEq/L (ref 19–32)
Calcium: 9.5 mg/dL (ref 8.4–10.5)
Chloride: 91 mEq/L — ABNORMAL LOW (ref 96–112)
Creatinine, Ser: 0.66 mg/dL (ref 0.40–1.20)
GFR: 77.57 mL/min (ref >60.00)
Glucose, Bld: 80 mg/dL (ref 70–99)
Potassium: 4.8 mEq/L (ref 3.5–5.1)
Sodium: 124 mEq/L — ABNORMAL LOW (ref 135–145)
Total Bilirubin: 0.4 mg/dL (ref 0.2–1.2)
Total Protein: 6.6 g/dL (ref 6.0–8.3)

## 2021-11-19 LAB — LIPID PANEL
Cholesterol: 155 mg/dL (ref 0–200)
HDL: 99.3 mg/dL (ref >39.00)
LDL Cholesterol: 45 mg/dL (ref 0–99)
NonHDL: 55.56
Total CHOL/HDL Ratio: 2
Triglycerides: 53 mg/dL (ref 0.0–149.0)
VLDL: 10.6 mg/dL (ref 0.0–40.0)

## 2021-11-19 LAB — CBC WITH DIFFERENTIAL/PLATELET
Basophils Absolute: 0.1 10*3/uL (ref 0.0–0.1)
Basophils Relative: 1.3 % (ref 0.0–3.0)
Eosinophils Absolute: 0 10*3/uL (ref 0.0–0.7)
Eosinophils Relative: 0.8 % (ref 0.0–5.0)
HCT: 37.7 % (ref 36.0–46.0)
Hemoglobin: 12.7 g/dL (ref 12.0–15.0)
Lymphocytes Relative: 12.4 % (ref 12.0–46.0)
Lymphs Abs: 0.7 10*3/uL (ref 0.7–4.0)
MCHC: 33.6 g/dL (ref 30.0–36.0)
MCV: 87.5 fl (ref 78.0–100.0)
Monocytes Absolute: 0.7 10*3/uL (ref 0.1–1.0)
Monocytes Relative: 12.1 % — ABNORMAL HIGH (ref 3.0–12.0)
Neutro Abs: 4.1 10*3/uL (ref 1.4–7.7)
Neutrophils Relative %: 73.4 % (ref 43.0–77.0)
Platelets: 293 10*3/uL (ref 150.0–400.0)
RBC: 4.31 Mil/uL (ref 3.87–5.11)
RDW: 14.2 % (ref 11.5–15.5)
WBC: 5.6 10*3/uL (ref 4.0–10.5)

## 2021-11-19 LAB — TSH: TSH: 1.7 u[IU]/mL (ref 0.35–5.50)

## 2021-11-19 LAB — VITAMIN B12: Vitamin B-12: 768 pg/mL (ref 211–911)

## 2021-11-19 LAB — VITAMIN D 25 HYDROXY (VIT D DEFICIENCY, FRACTURES): VITD: 55.24 ng/mL (ref 30.00–100.00)

## 2021-11-22 DIAGNOSIS — R531 Weakness: Secondary | ICD-10-CM | POA: Insufficient documentation

## 2021-11-22 NOTE — Assessment & Plan Note (Signed)
Well controlled, no changes to meds. Encouraged heart healthy diet such as the DASH diet and exercise as tolerated.  °

## 2021-11-22 NOTE — Assessment & Plan Note (Signed)
Restart remeron  F/u 3 months

## 2021-11-22 NOTE — Assessment & Plan Note (Signed)
Home health for pr / ot

## 2021-11-23 ENCOUNTER — Telehealth: Payer: Self-pay

## 2021-11-23 NOTE — Telephone Encounter (Signed)
Verbal orders given to [facility]- okay for start of care on 11/25/21

## 2021-11-25 DIAGNOSIS — M810 Age-related osteoporosis without current pathological fracture: Secondary | ICD-10-CM | POA: Diagnosis not present

## 2021-11-25 DIAGNOSIS — R001 Bradycardia, unspecified: Secondary | ICD-10-CM | POA: Diagnosis not present

## 2021-11-25 DIAGNOSIS — R531 Weakness: Secondary | ICD-10-CM | POA: Diagnosis not present

## 2021-11-25 DIAGNOSIS — I10 Essential (primary) hypertension: Secondary | ICD-10-CM | POA: Diagnosis not present

## 2021-11-30 DIAGNOSIS — M810 Age-related osteoporosis without current pathological fracture: Secondary | ICD-10-CM | POA: Diagnosis not present

## 2021-11-30 DIAGNOSIS — R001 Bradycardia, unspecified: Secondary | ICD-10-CM | POA: Diagnosis not present

## 2021-11-30 DIAGNOSIS — R531 Weakness: Secondary | ICD-10-CM | POA: Diagnosis not present

## 2021-11-30 DIAGNOSIS — I10 Essential (primary) hypertension: Secondary | ICD-10-CM | POA: Diagnosis not present

## 2021-12-08 ENCOUNTER — Telehealth: Payer: Self-pay

## 2021-12-08 DIAGNOSIS — M810 Age-related osteoporosis without current pathological fracture: Secondary | ICD-10-CM | POA: Diagnosis not present

## 2021-12-08 DIAGNOSIS — R531 Weakness: Secondary | ICD-10-CM | POA: Diagnosis not present

## 2021-12-08 DIAGNOSIS — R001 Bradycardia, unspecified: Secondary | ICD-10-CM | POA: Diagnosis not present

## 2021-12-08 DIAGNOSIS — I10 Essential (primary) hypertension: Secondary | ICD-10-CM | POA: Diagnosis not present

## 2021-12-08 NOTE — Telephone Encounter (Signed)
Stephen-PT called to make you aware pt had a fall 12/06/21 no injuries

## 2021-12-13 DIAGNOSIS — M810 Age-related osteoporosis without current pathological fracture: Secondary | ICD-10-CM | POA: Diagnosis not present

## 2021-12-13 DIAGNOSIS — R531 Weakness: Secondary | ICD-10-CM | POA: Diagnosis not present

## 2021-12-13 DIAGNOSIS — I10 Essential (primary) hypertension: Secondary | ICD-10-CM | POA: Diagnosis not present

## 2021-12-13 DIAGNOSIS — R001 Bradycardia, unspecified: Secondary | ICD-10-CM | POA: Diagnosis not present

## 2021-12-21 DIAGNOSIS — M810 Age-related osteoporosis without current pathological fracture: Secondary | ICD-10-CM | POA: Diagnosis not present

## 2021-12-21 DIAGNOSIS — R001 Bradycardia, unspecified: Secondary | ICD-10-CM | POA: Diagnosis not present

## 2021-12-21 DIAGNOSIS — I10 Essential (primary) hypertension: Secondary | ICD-10-CM | POA: Diagnosis not present

## 2021-12-21 DIAGNOSIS — R531 Weakness: Secondary | ICD-10-CM | POA: Diagnosis not present

## 2021-12-29 DIAGNOSIS — R531 Weakness: Secondary | ICD-10-CM | POA: Diagnosis not present

## 2021-12-29 DIAGNOSIS — M810 Age-related osteoporosis without current pathological fracture: Secondary | ICD-10-CM | POA: Diagnosis not present

## 2021-12-29 DIAGNOSIS — R001 Bradycardia, unspecified: Secondary | ICD-10-CM | POA: Diagnosis not present

## 2021-12-29 DIAGNOSIS — I10 Essential (primary) hypertension: Secondary | ICD-10-CM | POA: Diagnosis not present

## 2022-01-05 DIAGNOSIS — M810 Age-related osteoporosis without current pathological fracture: Secondary | ICD-10-CM | POA: Diagnosis not present

## 2022-01-05 DIAGNOSIS — R531 Weakness: Secondary | ICD-10-CM | POA: Diagnosis not present

## 2022-01-05 DIAGNOSIS — R001 Bradycardia, unspecified: Secondary | ICD-10-CM | POA: Diagnosis not present

## 2022-01-05 DIAGNOSIS — I10 Essential (primary) hypertension: Secondary | ICD-10-CM | POA: Diagnosis not present

## 2022-01-11 ENCOUNTER — Telehealth: Payer: Self-pay | Admitting: Family Medicine

## 2022-01-11 ENCOUNTER — Encounter: Payer: Self-pay | Admitting: Family Medicine

## 2022-01-11 ENCOUNTER — Ambulatory Visit (INDEPENDENT_AMBULATORY_CARE_PROVIDER_SITE_OTHER): Payer: Medicare Other | Admitting: Family Medicine

## 2022-01-11 VITALS — BP 132/80 | HR 75 | Temp 98.8°F | Resp 18 | Ht 61.0 in

## 2022-01-11 DIAGNOSIS — R296 Repeated falls: Secondary | ICD-10-CM | POA: Diagnosis not present

## 2022-01-11 DIAGNOSIS — E871 Hypo-osmolality and hyponatremia: Secondary | ICD-10-CM | POA: Diagnosis not present

## 2022-01-11 DIAGNOSIS — R269 Unspecified abnormalities of gait and mobility: Secondary | ICD-10-CM | POA: Diagnosis not present

## 2022-01-11 MED ORDER — NONFORMULARY OR COMPOUNDED ITEM: 0 refills | Status: AC

## 2022-01-11 NOTE — Assessment & Plan Note (Signed)
?   Sodium level Refer to neuro  ot and pt going to home rx wheelchair

## 2022-01-11 NOTE — Assessment & Plan Note (Addendum)
Recheck today. 

## 2022-01-11 NOTE — Telephone Encounter (Signed)
Patient's daughter requested more information regarding the patient's wheelchair prescription. She wanted to know if she needed to just take it somewhere or if there is a certain process she should follow. Please call patient to advise.

## 2022-01-11 NOTE — Progress Notes (Signed)
Subjective:   By signing my name below, I, Luiz Ochoa, attest that this documentation has been prepared under the direction and in the presence of [name], DO 01/11/2022   Patient ID: Cassidy Brown, female    DOB: [birth year], 88 y.o.   MRN: [id number]  Chief Complaint  Patient presents with   Difficulty Walking    HPI Patient is in today for follow up visit.  She is accompanied with her daughter.   Her daughter states that she had fallen sometime between August-September of 2021, on her head and buttocks. After this event she began to experienced difficulties ambulating.   She now experiences fatigue more quickly when walking. Her daughter states that her ambulation has suddenly worsened prior to the visit due to the "stiffness" in her right leg. The daughter believes her difficulty ambulating may be due to "frozen gait." Physical therapy has been coming to her house regularly as of this time.   She states that she has been eating regularly, but adds that her sense of taste has changed. For diet her daughter says that she eats a lot of salty foods like chips and TV dinners. She does not enjoy drinking water, but she does drink tap water sometimes.   Her daughter reports that when she took the antidepressant medication she developed side effects, but her daughter noticed that it improved her ambulation.   She has been having difficulties remembering names, but denies having any symptoms of memory loss.   She currently does not see a neurologist. They do not have a wheelchair at home at this time.  Past Medical History:  Diagnosis Date   Allergy    Anxiety 07/20/2017   Backache 06/15/2010   Qualifier: Diagnosis of  By: Jerold Coombe     Bleeding nose 08/03/2017   Last Assessment & Plan:  Formatting of this note might be different from the original. Concern over nosebleeds. Chronic history of intermittent bleeding primarily from the right nostril.  Has never  required packing or cautery.  No other anticoagulant therapy. EXAM shows excoriated area right anterior nasal septum. PLAN: Cauterized right nasal septum with silver nitrate.  Follow-up as needed.   Bradycardia 02/25/2019   BREAST CANCER, HX OF 03/07/2008   Qualifier: Diagnosis of  By: Jerold Coombe     Cancer [facility])    breast,hx of   Diverticulosis    DIVERTICULOSIS, COLON 10/13/2006   Qualifier: Diagnosis of  By: Jerold Coombe     DIZZINESS OR VERTIGO 10/13/2006   Qualifier: Diagnosis of  By: Jerold Coombe     Dyspnea on exertion 02/25/2019   Essential hypertension 04/02/2017   Glaucoma    Hypertension    HYPERTENSION 10/13/2006   Qualifier: Diagnosis of  By: Etter Sjogren DO,      Loose stools 04/30/2018   Osteoporosis    OSTEOPOROSIS 10/13/2006   Qualifier: Diagnosis of  By: Jerold Coombe     Pansinusitis 04/02/2017   Poliomyelitis    POLIOMYELITIS, HX OF 11/20/2007   Qualifier: Diagnosis of  By: Jerold Coombe     SCOLIOSIS 11/20/2007   Qualifier: Diagnosis of  By: Jerold Coombe     Sensation of fullness in ear 08/03/2017   Last Assessment & Plan:  Formatting of this note might be different from the original. Concern over fullness at the right ear. 20-monthhistory of fullness at the right ear.  She cannot use topical nasal steroid sprays due to  epistaxis.  She is not improving. EXAM shows decreased movement of the right tympanic membrane on insufflation.  Left moves well.  Weber tuning fork lateralizes of the right e   Vertigo    dizziness    Past Surgical History:  Procedure Laterality Date   BREAST SURGERY Left    Lumpectomy   DILATION AND CURETTAGE OF UTERUS     x3   ganglion cyst r wrist     LUMBAR LAMINECTOMY     SPINE SURGERY  11/2010   fusion--jenkins    Family History  Problem Relation Age of Onset   Hypertension Father    Kidney disease Father    Hypertension Sister    Coronary artery disease Other        P uncles    Social History   Socioeconomic  History   Marital status: Married    Spouse name: Not on file   Number of children: Not on file   Years of education: Not on file   Highest education level: Not on file  Occupational History   Occupation: [employer] --retired    Fish farm manager: RETIRED  Tobacco Use   Smoking status: Never   Smokeless tobacco: Never  Substance and Sexual Activity   Alcohol use: No   Drug use: No   Sexual activity: Never    Partners: Male  Other Topics Concern   Not on file  Social History Narrative   Exercise-- walking 1 x a week   Social Determinants of Health   Financial Resource Strain: Not on file  Food Insecurity: Not on file  Transportation Needs: Not on file  Physical Activity: Not on file  Stress: Not on file  Social Connections: Not on file  Intimate Partner Violence: Not on file    Outpatient Medications Prior to Visit  Medication Sig Dispense Refill   b complex vitamins tablet Take 1 tablet by mouth daily. Unknown strength     calcium citrate-vitamin D (CITRACAL+D) 315-200 MG-UNIT per tablet Take 1 tablet by mouth 3 (three) times daily.     dicyclomine (BENTYL) 20 MG tablet Take 1 tablet (20 mg total) by mouth 4 (four) times daily -  before meals and at bedtime. 45 tablet 1   diltiazem (CARDIZEM CD) 360 MG 24 hr capsule Take 1 capsule (360 mg total) by mouth daily. 30 capsule 1   irbesartan (AVAPRO) 300 MG tablet Take 1 tablet (300 mg total) by mouth daily. 30 tablet 1   latanoprost (XALATAN) 0.005 % ophthalmic solution Place 1 drop into both eyes at bedtime.     loratadine (CLARITIN) 10 MG tablet Take 10 mg by mouth daily as needed for allergies.     LORazepam (ATIVAN) 1 MG tablet TAKE 1 TABLET EVERY 8 HOURS (Patient taking differently: Take 1 mg by mouth as needed for anxiety. TAKE 1 TABLET EVERY 8 HOURS) 60 tablet 0   Magnesium Citrate 100 MG TABS Take 150 mg by mouth daily.     MAGNESIUM PO Take 500 mg by mouth daily.     meclizine (ANTIVERT) 25 MG tablet Take 1 tablet (25  mg total) by mouth 3 (three) times daily as needed. (Patient taking differently: Take 25 mg by mouth 3 (three) times daily as needed for dizziness.) 60 tablet 1   multivitamin-lutein (OCUVITE-LUTEIN) CAPS Take 1 capsule by mouth daily. Unknown strength     NONFORMULARY OR COMPOUNDED ITEM Light weight walker #1  As directed    Dx--balance issues,  Osteoporosis, dizziness  1 each 0   spironolactone (ALDACTONE) 25 MG tablet Take 0.5 tablets (12.5 mg total) by mouth daily. 15 tablet 1   mirtazapine (REMERON) 7.5 MG tablet Take 1 tablet (7.5 mg total) by mouth at bedtime. 30 tablet 2   No facility-administered medications prior to visit.    Allergies  Allergen Reactions   Amoxicillin Diarrhea    "I had diarrhea really bad."   Coreg [Carvedilol] Diarrhea   Hydrocodone    Plendil [Felodipine]     Caused pains in back and stomach and caused constipation   Propoxyphene N-Acetaminophen    Sertraline Hcl Other (See Comments)    Per patient shaking, Headache and weird feeling in her eyes.   Tenex [Guanfacine Hcl]     Made patient feel really bad and was ineffective in decreasing BP    Review of Systems  Constitutional:  Positive for malaise/fatigue.  Musculoskeletal:  Positive for falls (prior to the visit).       (+) Right lower extremity stiffness (+) Difficulty ambulating   Neurological:  Positive for sensory change (in taste), speech change (due to depression medication) and weakness.       (+) Loss of balance  Psychiatric/Behavioral:  Negative for memory loss.        Objective:    Physical Exam Constitutional:      Appearance: Normal appearance. She is not ill-appearing.  HENT:     Head: Normocephalic and atraumatic.     Right Ear: External ear normal.     Left Ear: External ear normal.  Eyes:     Extraocular Movements: Extraocular movements intact.     Pupils: Pupils are equal, round, and reactive to light.  Cardiovascular:     Rate and Rhythm: Normal rate and regular rhythm.      Pulses: Normal pulses.     Heart sounds: Normal heart sounds. No murmur heard.    No gallop.  Pulmonary:     Effort: Pulmonary effort is normal. No respiratory distress.     Breath sounds: Normal breath sounds. No wheezing or rales.  Skin:    General: Skin is warm and dry.  Neurological:     Mental Status: She is alert and oriented to person, place, and time.  Psychiatric:        Judgment: Judgment normal.     BP 132/80 (BP Location: Left Arm, Patient Position: Sitting, Cuff Size: Normal)   Pulse 75   Temp 98.8 F (37.1 C) (Oral)   Resp 18   Ht '5\' 1"'$  (1.549 m)   SpO2 97%   BMI 19.01 kg/m  Wt Readings from Last 3 Encounters:  11/18/21 100 lb 9.6 oz (45.6 kg)  05/11/21 98 lb 3.2 oz (44.5 kg)  01/07/21 87 lb (39.5 kg)    Diabetic Foot Exam - Simple   No data filed    Lab Results  Component Value Date   WBC 5.6 11/18/2021   HGB 12.7 11/18/2021   HCT 37.7 11/18/2021   PLT 293.0 11/18/2021   GLUCOSE 80 11/18/2021   CHOL 155 11/18/2021   TRIG 53.0 11/18/2021   HDL 99.30 11/18/2021   LDLCALC 45 11/18/2021   ALT 27 11/18/2021   AST 24 11/18/2021   NA 124 (L) 11/18/2021   K 4.8 11/18/2021   CL 91 (L) 11/18/2021   CREATININE 0.66 11/18/2021   BUN 22 11/18/2021   CO2 28 11/18/2021   TSH 1.70 11/18/2021   HGBA1C 5.5 08/20/2013   MICROALBUR 0.9 09/02/2014  Lab Results  Component Value Date   TSH 1.70 11/18/2021   Lab Results  Component Value Date   WBC 5.6 11/18/2021   HGB 12.7 11/18/2021   HCT 37.7 11/18/2021   MCV 87.5 11/18/2021   PLT 293.0 11/18/2021   Lab Results  Component Value Date   NA 124 (L) 11/18/2021   K 4.8 11/18/2021   CO2 28 11/18/2021   GLUCOSE 80 11/18/2021   BUN 22 11/18/2021   CREATININE 0.66 11/18/2021   BILITOT 0.4 11/18/2021   ALKPHOS 60 11/18/2021   AST 24 11/18/2021   ALT 27 11/18/2021   PROT 6.6 11/18/2021   ALBUMIN 4.1 11/18/2021   CALCIUM 9.5 11/18/2021   ANIONGAP 8 08/29/2017   GFR 77.57 11/18/2021   Lab  Results  Component Value Date   CHOL 155 11/18/2021   Lab Results  Component Value Date   HDL 99.30 11/18/2021   Lab Results  Component Value Date   LDLCALC 45 11/18/2021   Lab Results  Component Value Date   TRIG 53.0 11/18/2021   Lab Results  Component Value Date   CHOLHDL 2 11/18/2021   Lab Results  Component Value Date   HGBA1C 5.5 08/20/2013       Assessment & Plan:   Problem List Items Addressed This Visit   None Visit Diagnoses     Falling    -  Primary   Relevant Medications   NONFORMULARY OR COMPOUNDED ITEM   Other Relevant Orders   Ambulatory referral to Neurology   Comprehensive metabolic panel   Abnormal gait       Relevant Medications   NONFORMULARY OR COMPOUNDED ITEM   Other Relevant Orders   Ambulatory referral to Neurology   Comprehensive metabolic panel   Hyponatremia       Relevant Orders   Comprehensive metabolic panel        Meds ordered this encounter  Medications   NONFORMULARY OR COMPOUNDED ITEM    Sig: Manual wheelchair  #1  as directed ----  dx weakness, abnormal gait    Dispense:  1 each    Refill:  0    I, [name], DO, personally preformed the services described in this documentation.  All medical record entries made by the scribe were at my direction and in my presence.  I have reviewed the chart and discharge instructions (if applicable) and agree that the record reflects my personal performance and is accurate and complete. 01/11/2022   I,Tinashe Williams,acting as a scribe for [name], DO.,have documented all relevant documentation on the behalf of [name], DO,as directed by  [name], DO while in the presence of [name], DO.    [name], DO

## 2022-01-11 NOTE — Patient Instructions (Signed)
Fall Prevention in the Home, Adult Falls can cause injuries and affect people of all ages. There are many simple things that you can do to make your home safe and to help prevent falls. Ask for help when making these changes, if needed. What actions can I take to prevent falls? General instructions Use good lighting in all rooms. Replace any light bulbs that burn out, turn on lights if it is dark, and use night-lights. Place frequently used items in easy-to-reach places. Lower the shelves around your home if necessary. Set up furniture so that there are clear paths around it. Avoid moving your furniture around. Remove throw rugs and other tripping hazards from the floor. Avoid walking on wet floors. Fix any uneven floor surfaces. Add color or contrast paint or tape to grab bars and handrails in your home. Place contrasting color strips on the first and last steps of staircases. When you use a stepladder, make sure that it is completely opened and that the sides and supports are firmly locked. Have someone hold the ladder while you are using it. Do not climb a closed stepladder. Know where your pets are when moving through your home. What can I do in the bathroom?     Keep the floor dry. Immediately clean up any water that is on the floor. Remove soap buildup in the tub or shower regularly. Use nonskid mats or decals on the floor of the tub or shower. Attach bath mats securely with double-sided, nonslip rug tape. If you need to sit down while you are in the shower, use a plastic, nonslip stool. Install grab bars by the toilet and in the tub and shower. Do not use towel bars as grab bars. What can I do in the bedroom? Make sure that a bedside light is easy to reach. Do not use oversized bedding that reaches the floor. Have a firm chair that has side arms to use for getting dressed. What can I do in the kitchen? Clean up any spills right away. If you need to reach for something above you,  use a sturdy step stool that has a grab bar. Keep electrical cables out of the way. Do not use floor polish or wax that makes floors slippery. If you must use wax, make sure that it is non-skid floor wax. What can I do with my stairs? Do not leave any items on the stairs. Make sure that you have a light switch at the top and the bottom of the stairs. Have them installed if you do not have them. Make sure that there are handrails on both sides of the stairs. Fix handrails that are broken or loose. Make sure that handrails are as long as the staircases. Install non-slip stair treads on all stairs in your home. Avoid having throw rugs at the top or bottom of stairs, or secure the rugs with carpet tape to prevent them from moving. Choose a carpet design that does not hide the edge of steps on the stairs. Check any carpeting to make sure that it is firmly attached to the stairs. Fix any carpet that is loose or worn. What can I do on the outside of my home? Use bright outdoor lighting. Regularly repair the edges of walkways and driveways and fix any cracks. Remove high doorway thresholds. Trim any shrubbery on the main path into your home. Regularly check that handrails are securely fastened and in good repair. Both sides of all steps should have handrails. Install guardrails along   the edges of any raised decks or porches. Clear walkways of debris and clutter, including tools and rocks. Have leaves, snow, and ice cleared regularly. Use sand or salt on walkways during winter months. In the garage, clean up any spills right away, including grease or oil spills. What other actions can I take? Wear closed-toe shoes that fit well and support your feet. Wear shoes that have rubber soles or low heels. Use mobility aids as needed, such as canes, walkers, scooters, and crutches. Review your medicines with your health care provider. Some medicines can cause dizziness or changes in blood pressure, which  increase your risk of falling. Talk with your health care provider about other ways that you can decrease your risk of falls. This may include working with a physical therapist or trainer to improve your strength, balance, and endurance. Where to find more information Centers for Disease Control and Prevention, [employer]: www.cdc.gov National Institute on Aging: www.nia.nih.gov Contact a health care provider if: You are afraid of falling at home. You feel weak, drowsy, or dizzy at home. You fall at home. Summary There are many simple things that you can do to make your home safe and to help prevent falls. Ways to make your home safe include removing tripping hazards and installing grab bars in the bathroom. Ask for help when making these changes in your home. This information is not intended to replace advice given to you by your health care provider. Make sure you discuss any questions you have with your health care provider. Document Revised: 03/01/2021 Document Reviewed: 01/01/2020 Elsevier Patient Education  2023 Elsevier Inc.  

## 2022-01-12 ENCOUNTER — Telehealth: Payer: Self-pay

## 2022-01-12 DIAGNOSIS — R531 Weakness: Secondary | ICD-10-CM | POA: Diagnosis not present

## 2022-01-12 DIAGNOSIS — M810 Age-related osteoporosis without current pathological fracture: Secondary | ICD-10-CM | POA: Diagnosis not present

## 2022-01-12 DIAGNOSIS — I10 Essential (primary) hypertension: Secondary | ICD-10-CM | POA: Diagnosis not present

## 2022-01-12 DIAGNOSIS — R001 Bradycardia, unspecified: Secondary | ICD-10-CM | POA: Diagnosis not present

## 2022-01-12 LAB — COMPREHENSIVE METABOLIC PANEL
ALT: 28 U/L (ref 0–35)
AST: 27 U/L (ref 0–37)
Albumin: 4.3 g/dL (ref 3.5–5.2)
Alkaline Phosphatase: 57 U/L (ref 39–117)
BUN: 20 mg/dL (ref 6–23)
CO2: 25 mEq/L (ref 19–32)
Calcium: 9.6 mg/dL (ref 8.4–10.5)
Chloride: 89 mEq/L — ABNORMAL LOW (ref 96–112)
Creatinine, Ser: 0.71 mg/dL (ref 0.40–1.20)
GFR: 75.11 mL/min (ref >60.00)
Glucose, Bld: 92 mg/dL (ref 70–99)
Potassium: 4.5 mEq/L (ref 3.5–5.1)
Sodium: 121 mEq/L — CL (ref 135–145)
Total Bilirubin: 0.5 mg/dL (ref 0.2–1.2)
Total Protein: 6.7 g/dL (ref 6.0–8.3)

## 2022-01-12 NOTE — Telephone Encounter (Signed)
Did you mean to give them the Rx for the wheelchair? Or were we supposed to go through [facility]?

## 2022-01-12 NOTE — Telephone Encounter (Signed)
Called and informed patient per Dr. Etter Sjogren she needs to hold diuretic until further notice per provider.

## 2022-01-12 NOTE — Telephone Encounter (Signed)
CRITICAL VALUE STICKER  CRITICAL VALUE: Sodium 121  RECEIVER (on-site recipient of call):  , RMA  DATE & TIME NOTIFIED: 01/12/2022 at 11:57 am  MESSENGER (representative from lab): Festus Holts  MD NOTIFIED: Roma Schanz, DO, Kathlene November, MD  TIME OF NOTIFICATION: 12:05 pm   RESPONSE:

## 2022-01-12 NOTE — Telephone Encounter (Signed)
Sodium getting gradually lower, patient is on a diuretic. Consider further evaluation.   Since she was alert and oriented x3 at the time of the blood draw I think is okay for PCP to manage the results later on today or tomorrow.

## 2022-01-14 ENCOUNTER — Encounter: Payer: Self-pay | Admitting: Neurology

## 2022-01-17 ENCOUNTER — Telehealth: Payer: Self-pay | Admitting: Family Medicine

## 2022-01-17 ENCOUNTER — Other Ambulatory Visit: Payer: Self-pay | Admitting: Cardiology

## 2022-01-17 DIAGNOSIS — I10 Essential (primary) hypertension: Secondary | ICD-10-CM

## 2022-01-17 NOTE — Telephone Encounter (Signed)
Pt is still having issues walking and would like to know what to do to help.

## 2022-01-19 ENCOUNTER — Other Ambulatory Visit: Payer: Self-pay | Admitting: Family Medicine

## 2022-01-19 ENCOUNTER — Telehealth: Payer: Self-pay | Admitting: Family Medicine

## 2022-01-19 DIAGNOSIS — E871 Hypo-osmolality and hyponatremia: Secondary | ICD-10-CM

## 2022-01-19 DIAGNOSIS — R531 Weakness: Secondary | ICD-10-CM | POA: Diagnosis not present

## 2022-01-19 DIAGNOSIS — I10 Essential (primary) hypertension: Secondary | ICD-10-CM | POA: Diagnosis not present

## 2022-01-19 DIAGNOSIS — M810 Age-related osteoporosis without current pathological fracture: Secondary | ICD-10-CM | POA: Diagnosis not present

## 2022-01-19 DIAGNOSIS — R001 Bradycardia, unspecified: Secondary | ICD-10-CM | POA: Diagnosis not present

## 2022-01-19 NOTE — Telephone Encounter (Signed)
Patient called to ask Dr. Etter Sjogren if she can take salt tablets since she is still feeling weak and she hadn't heard back. Please call patient to advise. Prescription okay to be sent to [facility] [id number]

## 2022-01-19 NOTE — Telephone Encounter (Signed)
Spoke with patient. Pt has appt with Neuro on 03/24/2022

## 2022-01-20 ENCOUNTER — Other Ambulatory Visit: Payer: Self-pay | Admitting: Family Medicine

## 2022-01-20 DIAGNOSIS — E871 Hypo-osmolality and hyponatremia: Secondary | ICD-10-CM

## 2022-01-20 MED ORDER — SODIUM CHLORIDE 1 G PO TABS: 1.0000 g | ORAL_TABLET | Freq: Two times a day (BID) | ORAL | 0 refills | Status: DC

## 2022-01-20 NOTE — Telephone Encounter (Signed)
Spoke with patient. Pt verbalized understanding and states she will have to call back to schedule a lab appointment.

## 2022-01-21 ENCOUNTER — Other Ambulatory Visit (INDEPENDENT_AMBULATORY_CARE_PROVIDER_SITE_OTHER): Payer: Medicare Other

## 2022-01-21 ENCOUNTER — Telehealth: Payer: Self-pay | Admitting: Family Medicine

## 2022-01-21 DIAGNOSIS — E871 Hypo-osmolality and hyponatremia: Secondary | ICD-10-CM

## 2022-01-21 NOTE — Telephone Encounter (Signed)
Verbal given 

## 2022-01-21 NOTE — Addendum Note (Signed)
Addended by: Manuela Schwartz on: 01/21/2022 02:20 PM   Modules accepted: Orders

## 2022-01-21 NOTE — Telephone Encounter (Signed)
Caller/Agency: Wannetta Sender [facility]) Callback Number: [phone] Requesting OT/PT/Skilled Nursing/Social Work/Speech Therapy: PT Frequency: 2 w 4, 1 w 2 starting next week

## 2022-01-21 NOTE — Telephone Encounter (Signed)
Patient's daughter would like to know if the Neurology referral sent to her mom was supposed to be sent for Dr. Carles Collet not Dr. Berdine Addison. She states that during the visit when the referral was placed, Dr. Etter Sjogren specifically had mentioned Dr. Carles Collet. Pt's daughter would like to know if there was a reason why that was changed. Please advise.

## 2022-01-22 LAB — BASIC METABOLIC PANEL
BUN: 22 mg/dL (ref 7–25)
CO2: 25 mmol/L (ref 20–32)
Calcium: 9.6 mg/dL (ref 8.6–10.4)
Chloride: 92 mmol/L — ABNORMAL LOW (ref 98–110)
Creat: 0.71 mg/dL (ref 0.60–0.95)
Glucose, Bld: 115 mg/dL — ABNORMAL HIGH (ref 65–99)
Potassium: 4.7 mmol/L (ref 3.5–5.3)
Sodium: 126 mmol/L — ABNORMAL LOW (ref 135–146)

## 2022-01-25 DIAGNOSIS — M810 Age-related osteoporosis without current pathological fracture: Secondary | ICD-10-CM | POA: Diagnosis not present

## 2022-01-25 DIAGNOSIS — R531 Weakness: Secondary | ICD-10-CM | POA: Diagnosis not present

## 2022-01-25 DIAGNOSIS — R001 Bradycardia, unspecified: Secondary | ICD-10-CM | POA: Diagnosis not present

## 2022-01-25 DIAGNOSIS — I10 Essential (primary) hypertension: Secondary | ICD-10-CM | POA: Diagnosis not present

## 2022-01-26 NOTE — Telephone Encounter (Signed)
Spoke to patient's daughter and advised her to call the Neurologist and discuss with them why the provider was changed. Pt's daughter also stated that the appt is set up for a long time from now and that the physical therapist treating her mom, informed her that her mom needs to see someone sooner. Advised daughter to contact office to check which provider has sooner appts and if another office that accepts her mom's insurance has sooner appointments. Pt's daughter stated she would call the neurologist office and start there.

## 2022-01-27 DIAGNOSIS — M810 Age-related osteoporosis without current pathological fracture: Secondary | ICD-10-CM | POA: Diagnosis not present

## 2022-01-27 DIAGNOSIS — R001 Bradycardia, unspecified: Secondary | ICD-10-CM | POA: Diagnosis not present

## 2022-01-27 DIAGNOSIS — I10 Essential (primary) hypertension: Secondary | ICD-10-CM | POA: Diagnosis not present

## 2022-01-27 DIAGNOSIS — R531 Weakness: Secondary | ICD-10-CM | POA: Diagnosis not present

## 2022-01-31 ENCOUNTER — Telehealth: Payer: Self-pay

## 2022-01-31 NOTE — Telephone Encounter (Signed)
Pt would like to her for endo to be sent to Dr. Neta Ehlers at [facility].

## 2022-02-02 DIAGNOSIS — I10 Essential (primary) hypertension: Secondary | ICD-10-CM | POA: Diagnosis not present

## 2022-02-02 DIAGNOSIS — R531 Weakness: Secondary | ICD-10-CM | POA: Diagnosis not present

## 2022-02-02 DIAGNOSIS — R001 Bradycardia, unspecified: Secondary | ICD-10-CM | POA: Diagnosis not present

## 2022-02-02 DIAGNOSIS — M810 Age-related osteoporosis without current pathological fracture: Secondary | ICD-10-CM | POA: Diagnosis not present

## 2022-02-04 DIAGNOSIS — I10 Essential (primary) hypertension: Secondary | ICD-10-CM | POA: Diagnosis not present

## 2022-02-04 DIAGNOSIS — R531 Weakness: Secondary | ICD-10-CM | POA: Diagnosis not present

## 2022-02-04 DIAGNOSIS — R001 Bradycardia, unspecified: Secondary | ICD-10-CM | POA: Diagnosis not present

## 2022-02-04 DIAGNOSIS — M810 Age-related osteoporosis without current pathological fracture: Secondary | ICD-10-CM | POA: Diagnosis not present

## 2022-02-07 DIAGNOSIS — R531 Weakness: Secondary | ICD-10-CM | POA: Diagnosis not present

## 2022-02-07 DIAGNOSIS — I10 Essential (primary) hypertension: Secondary | ICD-10-CM | POA: Diagnosis not present

## 2022-02-07 DIAGNOSIS — R001 Bradycardia, unspecified: Secondary | ICD-10-CM | POA: Diagnosis not present

## 2022-02-07 DIAGNOSIS — M810 Age-related osteoporosis without current pathological fracture: Secondary | ICD-10-CM | POA: Diagnosis not present

## 2022-02-07 NOTE — Telephone Encounter (Signed)
Patient said she called to schedule appt and they said they have not received referral yet. Patient is anxious and would like referral to be resent or follow up with Dr. Dyann Kief office to determine what the issue is since we sent it last week. Please call her back to advise.

## 2022-02-10 ENCOUNTER — Telehealth: Payer: Self-pay

## 2022-02-10 ENCOUNTER — Other Ambulatory Visit: Payer: Self-pay

## 2022-02-10 ENCOUNTER — Telehealth: Payer: Self-pay | Admitting: Cardiology

## 2022-02-10 DIAGNOSIS — I10 Essential (primary) hypertension: Secondary | ICD-10-CM | POA: Diagnosis not present

## 2022-02-10 DIAGNOSIS — R531 Weakness: Secondary | ICD-10-CM | POA: Diagnosis not present

## 2022-02-10 DIAGNOSIS — M810 Age-related osteoporosis without current pathological fracture: Secondary | ICD-10-CM | POA: Diagnosis not present

## 2022-02-10 DIAGNOSIS — R001 Bradycardia, unspecified: Secondary | ICD-10-CM | POA: Diagnosis not present

## 2022-02-10 MED ORDER — DILTIAZEM HCL ER COATED BEADS 360 MG PO CP24: 360.0000 mg | ORAL_CAPSULE | Freq: Every day | ORAL | 3 refills | Status: AC

## 2022-02-10 MED ORDER — IRBESARTAN 300 MG PO TABS: 300.0000 mg | ORAL_TABLET | Freq: Every day | ORAL | 3 refills | Status: AC

## 2022-02-10 NOTE — Telephone Encounter (Signed)
Cardizem 360 #90 ref x3, Avapro '200mg'$  #90 ref x 3.

## 2022-02-10 NOTE — Telephone Encounter (Signed)
*  STAT* If patient is at the pharmacy, call can be transferred to refill team.   1. Which medications need to be refilled? (please list name of each medication and dose if known)   diltiazem (CARDIZEM CD) 360 MG 24 hr capsule    irbesartan (AVAPRO) 300 MG tablet    2. Which pharmacy/location (including street and city if local pharmacy) is medication to be sent to?  [facility] ([place], [place]. Do they need a 30 day or 90 day supply?  90 day

## 2022-02-11 NOTE — Telephone Encounter (Signed)
Patient was given information and stated she would call them.

## 2022-02-15 ENCOUNTER — Telehealth: Payer: Self-pay | Admitting: Family Medicine

## 2022-02-15 NOTE — Telephone Encounter (Signed)
Pt called stating that the endo office we referred her out to has not received the referral and is unable to schedule her until they have that. Referral was sent on 8.28.23 but did not see any additional info after that. Pt also stated that apparently the office told her that the referral coordinator was supposed to make the appt, not the pt. Pt would like a call back once more information is available.

## 2022-02-16 DIAGNOSIS — R531 Weakness: Secondary | ICD-10-CM | POA: Diagnosis not present

## 2022-02-16 DIAGNOSIS — R001 Bradycardia, unspecified: Secondary | ICD-10-CM | POA: Diagnosis not present

## 2022-02-16 DIAGNOSIS — I10 Essential (primary) hypertension: Secondary | ICD-10-CM | POA: Diagnosis not present

## 2022-02-16 DIAGNOSIS — M810 Age-related osteoporosis without current pathological fracture: Secondary | ICD-10-CM | POA: Diagnosis not present

## 2022-02-21 ENCOUNTER — Telehealth: Payer: Self-pay | Admitting: Family Medicine

## 2022-02-21 MED ORDER — SODIUM CHLORIDE 1 G PO TABS: 1.0000 g | ORAL_TABLET | Freq: Two times a day (BID) | ORAL | 2 refills | Status: AC

## 2022-02-21 NOTE — Telephone Encounter (Signed)
Refill sent.

## 2022-02-21 NOTE — Telephone Encounter (Signed)
Medication: sodium chloride 1 g tablet [[id number]]   Has the patient contacted their pharmacy? Yes.    They should be contacting us; patient has been a month without medication  Preferred Pharmacy (with phone number or street name): [facility] ([place]) [place], [facility]  [address] [place] [address], [place] [address]  Phone:  [phone]  Fax:  [phone]   Agent: Please be advised that RX refills may take up to 3 business days. We ask that you follow-up with your pharmacy.

## 2022-02-23 NOTE — Telephone Encounter (Signed)
Patient called back stating she spoke to Dr. Serita Grit office today and they told her again that they had not received the referral and could not set up the appointment. Please advise.

## 2022-02-24 DIAGNOSIS — R531 Weakness: Secondary | ICD-10-CM | POA: Diagnosis not present

## 2022-02-24 DIAGNOSIS — M810 Age-related osteoporosis without current pathological fracture: Secondary | ICD-10-CM | POA: Diagnosis not present

## 2022-02-24 DIAGNOSIS — I10 Essential (primary) hypertension: Secondary | ICD-10-CM | POA: Diagnosis not present

## 2022-02-24 DIAGNOSIS — R001 Bradycardia, unspecified: Secondary | ICD-10-CM | POA: Diagnosis not present

## 2022-03-02 DIAGNOSIS — R531 Weakness: Secondary | ICD-10-CM | POA: Diagnosis not present

## 2022-03-02 DIAGNOSIS — M810 Age-related osteoporosis without current pathological fracture: Secondary | ICD-10-CM | POA: Diagnosis not present

## 2022-03-02 DIAGNOSIS — R001 Bradycardia, unspecified: Secondary | ICD-10-CM | POA: Diagnosis not present

## 2022-03-02 DIAGNOSIS — I10 Essential (primary) hypertension: Secondary | ICD-10-CM | POA: Diagnosis not present

## 2022-03-22 NOTE — Progress Notes (Signed)
Initial neurology clinic note  SERVICE DATE: 03/24/22  Reason for Evaluation: Consultation requested by [name], * for an opinion regarding walking difficulty. My final recommendations will be communicated back to the requesting physician by way of shared medical record or letter to requesting physician via [place] mail.  HPI: This is Ms. Cassidy Brown, a 88 y.o. right-handed female with a medical history of breast cancer (dx in 2006 s/p surgery and radiation), diverticulosis, HTN, osteoporosis, lumbar stenosis s/p L4/5-L5/S1 fusion, poliomyelitis, anxiety who presents to neurology clinic with the chief complaint of difficulty walking. The patient is accompanied by daughter.  Daughter started noticing she had weakness of her legs, right more than left in 02/2020. She fell around 01/2020 hitting her head and buttocks. She has had difficulty ambulating since this. Over the last few months, patient has declined further. She gets tired while walking. She gets short of breath more easily. She noticed that her right leg feels weaker and can shake. Daughter also feels like she has a stiff right leg and a frozen gait. Patient gets PT for this. PT felt both legs were weak per daughter. Patient walks with a walker for about 5 years. She has had 2 recent falls. She fell backwards twice. She needed helped from their live in care helper. Walking is good at times and worse at other times. It seems to come and go.  Patient has much smaller handwriting of late per daughter. Daughter also mentions possible cognitive changes.   Daughter (and perhaps PT) were wondering about Parkinson's disease. Daughter also mentions that patient's husband has dementia, which is hard for patient. Daughter also feels there is a component of depression and anxiety as well.  She endorses poor sleep. She has difficulty staying asleep. She has a lot of neck pain that keeps her up. She does not always feel rested when waking up.  She does not think she acts out her dreams.  Patient denies tremor. She may have some freezing of her feet/legs. She has hip pain, but not clear back pain. She also endorses daily cervicogenic headaches. She has had headaches for 5 years. She is not getting PT for her neck. She takes frequent NSAIDs (up to 8 advil per day) but gets incomplete relief.  Of note, patient's sodium is low and will be seeing endocrinology in 04/2022.  Patient is on a B complex Patient takes meclizine as needed for vertigo, but this is very rare (once every 6 months).  Patient had polio at age 60. She was in the hospital for 9 months. She had back pain but doesn't remember much more. Her residual symptoms were curvature of spine and weak back and abdominals per patient.   MEDICATIONS:  Outpatient Encounter Medications as of 03/24/2022  Medication Sig   MAGNESIUM PO Take 500 mg by mouth daily.   meclizine (ANTIVERT) 25 MG tablet Take 1 tablet (25 mg total) by mouth 3 (three) times daily as needed. (Patient taking differently: Take 25 mg by mouth 3 (three) times daily as needed for dizziness.)   multivitamin-lutein (OCUVITE-LUTEIN) CAPS Take 1 capsule by mouth daily. Unknown strength   NONFORMULARY OR COMPOUNDED ITEM Light weight walker #1  As directed    Dx--balance issues,  Osteoporosis, dizziness   NONFORMULARY OR COMPOUNDED ITEM Manual wheelchair  #1  as directed ----  dx weakness, abnormal gait   sodium chloride 1 g tablet Take 1 tablet (1 g total) by mouth 2 (two) times daily with a  meal.   b complex vitamins tablet Take 1 tablet by mouth daily. Unknown strength   calcium citrate-vitamin D (CITRACAL+D) 315-200 MG-UNIT per tablet Take 1 tablet by mouth 3 (three) times daily.   dicyclomine (BENTYL) 20 MG tablet Take 1 tablet (20 mg total) by mouth 4 (four) times daily -  before meals and at bedtime.   diltiazem (CARDIZEM CD) 360 MG 24 hr capsule Take 1 capsule (360 mg total) by mouth daily.   irbesartan (AVAPRO)  300 MG tablet Take 1 tablet (300 mg total) by mouth daily.   latanoprost (XALATAN) 0.005 % ophthalmic solution Place 1 drop into both eyes at bedtime.   loratadine (CLARITIN) 10 MG tablet Take 10 mg by mouth daily as needed for allergies.   LORazepam (ATIVAN) 1 MG tablet TAKE 1 TABLET EVERY 8 HOURS (Patient taking differently: Take 1 mg by mouth as needed for anxiety. TAKE 1 TABLET EVERY 8 HOURS)   Magnesium Citrate 100 MG TABS Take 150 mg by mouth daily.   spironolactone (ALDACTONE) 25 MG tablet TAKE ONE-HALF TABLET BY MOUTH  DAILY (Patient not taking: Reported on 03/24/2022)   No facility-administered encounter medications on file as of 03/24/2022.    PAST MEDICAL HISTORY: Past Medical History:  Diagnosis Date   Allergy    Anxiety 07/20/2017   Backache 06/15/2010   Qualifier: Diagnosis of  By: Jerold Coombe     Bleeding nose 08/03/2017   Last Assessment & Plan:  Formatting of this note might be different from the original. Concern over nosebleeds. Chronic history of intermittent bleeding primarily from the right nostril.  Has never required packing or cautery.  No other anticoagulant therapy. EXAM shows excoriated area right anterior nasal septum. PLAN: Cauterized right nasal septum with silver nitrate.  Follow-up as needed.   Bradycardia 02/25/2019   BREAST CANCER, HX OF 03/07/2008   Qualifier: Diagnosis of  By: Jerold Coombe     Cancer [facility])    breast,hx of   Diverticulosis    DIVERTICULOSIS, COLON 10/13/2006   Qualifier: Diagnosis of  By: Jerold Coombe     DIZZINESS OR VERTIGO 10/13/2006   Qualifier: Diagnosis of  By: Jerold Coombe     Dyspnea on exertion 02/25/2019   Essential hypertension 04/02/2017   Glaucoma    Hypertension    HYPERTENSION 10/13/2006   Qualifier: Diagnosis of  By: Etter Sjogren DO, Yvonne     Loose stools 04/30/2018   Osteoporosis    OSTEOPOROSIS 10/13/2006   Qualifier: Diagnosis of  By: Jerold Coombe     Pansinusitis 04/02/2017   Poliomyelitis     POLIOMYELITIS, HX OF 11/20/2007   Qualifier: Diagnosis of  By: Jerold Coombe     SCOLIOSIS 11/20/2007   Qualifier: Diagnosis of  By: Jerold Coombe     Sensation of fullness in ear 08/03/2017   Last Assessment & Plan:  Formatting of this note might be different from the original. Concern over fullness at the right ear. 36-monthhistory of fullness at the right ear.  She cannot use topical nasal steroid sprays due to epistaxis.  She is not improving. EXAM shows decreased movement of the right tympanic membrane on insufflation.  Left moves well.  Weber tuning fork lateralizes of the right e   Vertigo    dizziness    PAST SURGICAL HISTORY: Past Surgical History:  Procedure Laterality Date   BREAST SURGERY Left    Lumpectomy   DILATION AND CURETTAGE OF UTERUS  x3   ganglion cyst r wrist     LUMBAR LAMINECTOMY     SPINE SURGERY  11/2010   fusion--jenkins    ALLERGIES: Allergies  Allergen Reactions   Amoxicillin Diarrhea    "I had diarrhea really bad."   Coreg [Carvedilol] Diarrhea   Hydrocodone    Plendil [Felodipine]     Caused pains in back and stomach and caused constipation   Propoxyphene N-Acetaminophen    Sertraline Hcl Other (See Comments)    Per patient shaking, Headache and weird feeling in her eyes.   Tenex [Guanfacine Hcl]     Made patient feel really bad and was ineffective in decreasing BP    FAMILY HISTORY: Family History  Problem Relation Age of Onset   Hypertension Father    Kidney disease Father    Hypertension Sister    Coronary artery disease Other        P uncles    SOCIAL HISTORY: Social History   Tobacco Use   Smoking status: Never   Smokeless tobacco: Never  Vaping Use   Vaping Use: Never used  Substance Use Topics   Alcohol use: No   Drug use: No   Social History   Social History Narrative   Exercise-- walking 1 x a week   Right handed    Caffeine 1 cup daily 1/2 & 1/2     OBJECTIVE: PHYSICAL EXAM: BP (!) 167/88   Pulse 98    Ht 5' (1.524 m)   Wt 98 lb (44.5 kg)   SpO2 95%   BMI 19.14 kg/m   General: General appearance: Awake and alert. No distress. Cooperative with exam.  Skin: No obvious rash or jaundice. HEENT: Atraumatic. Anicteric. Lungs: Non-labored breathing on room air  Spine: Severe scoliosis  Extremities: No edema. Arthritic deformity in bilateral hands.  Psych: Affect appropriate.  Neurological: Mental Status: Alert. Speech fluent. No pseudobulbar affect Cranial Nerves: CNII: No RAPD. Visual fields grossly intact. CNIII, IV, VI: PERRL. No nystagmus. EOMI. CN V: Facial sensation intact bilaterally to fine touch. CN VII: Facial muscles symmetric and strong. No ptosis at rest. CN VIII: Hearing grossly intact bilaterally. CN IX: No hypophonia. CN X: Palate elevates symmetrically. CN XI: Full strength shoulder shrug bilaterally. CN XII: Tongue protrusion full and midline. No atrophy or fasciculations. No significant dysarthria Motor: Tone is normal.  Individual muscle group testing (MRC grade out of 5):  Movement     Neck flexion 4    Neck extension 5     Right Left   Shoulder abduction 5 5   Elbow flexion 5 5   Elbow extension 5 5   Finger extension 5 5   Finger distal flexion - 2/'3 5 5   '$ Finger distal flexion - 4/'5 5 5   '$ Thumb flexion - FPL 5 5    Hip flexion 5 5   Hip extension 5 5   Hip adduction 5 5   Hip abduction 5 5   Knee extension 5 5   Knee flexion 5 5   Dorsiflexion 4 5   Plantarflexion 5 5   Inversion 4 5   Eversion 5 5   Great toe extension 4- 5-   Great toe flexion 4+ 5-     Reflexes:  Right Left   Bicep 3+ 3+   Tricep 3+ 3+   BrRad 3+ 3+   Knee 3+ 3+   Ankle 2+ 2+    Pathological Reflexes: Babinski: mute response bilaterally Hoffman: absent on  right, present on left Troemner: present bilaterally Sensation: Vibration: Intact except: less strong in right great toe compared to left Temperature: Intact in all extremities Proprioception: Intact  in bilateral great toes Coordination: Intact finger-to- nose-finger bilaterally. RAM within normal limits (some difficulties in hands due to arthritis and in right foot due to DF weakness) Gait: Walks with walker. Severe scoliosis. Hunched over when walking. Shaking of right foot when attempting to move right leg. Very unstable. Steppage gait on right.  Lab and Test Review: Internal labs: Normal or unremarkable: vit D, B12 (768), TSH BMP (01/21/22) significant for hyponatremia to 126 (chronic), elevated glucose (115) HbA1c (08/20/13): 5.5  MRI thoracic spine wo contrast (02/20/20): FINDINGS: Limited cervical spine imaging:  Negative for age.   Thoracic spine segmentation:  Appears normal.   Alignment: Mild levoconvex upper and mild to moderate dextroconvex lower thoracic scoliosis. No spondylolisthesis.   Vertebrae: Comminuted compression fracture of the T11 vertebral body with central loss of height up to 35%. Complex marrow edema and blood or fluid signal within the comminuted vertebral body (series 20, image 9) with mild retropulsion of bone resulting in mild spinal stenosis and mild cord mass effect (series 19, image 10 and series 21, image 33). The T11 posterior elements appear to remain intact, although there is edema in the right T11 pedicle. Furthermore there is a nondisplaced fracture of the right T11 costovertebral junction.   Background bone marrow signal is within normal limits. Benign T5 vertebral body hemangioma. No other marrow edema or acute osseous abnormality.   Cord: No lower thoracic cord signal abnormality at T11. Conus medullaris appears normal at T12. Capacious thoracic spinal canal above T11. Normal thoracic cord signal and morphology elsewhere.   Paraspinal and other soft tissues: Negative.   Disc levels:   Generally mild for age thoracic spine degeneration. There is a small disc herniation at T6-T7 (series 21, image 20) not resulting in stenosis.    IMPRESSION: 1. Complex subacute T11 compression fracture with comminution, central loss of height up to 35%, and retropulsion of bone resulting in mild spinal stenosis with mild cord mass effect. Superimposed fracture of the right 11th rib at the costovertebral junction.   2. No spinal cord signal abnormality. Largely normal for age thoracic spine elsewhere; underlying scoliosis.  MRI lumbar spine wo contrast (02/20/20): FINDINGS: Segmentation: Normal, the same numbering system used on the thoracic MRI today.   Alignment: Chronic grade 2 spondylolisthesis of L5 on S1, stable since 2012. moderate dextroconvex scoliosis at the thoracolumbar junction.   Vertebrae: Mild hardware susceptibility artifact from L4 to the sacrum. No lumbar marrow edema or acute osseous abnormality. Normal background bone marrow signal. Intact visible sacrum and SI joints.   Complex fracture of the T11 vertebral body reported separately today.   Conus medullaris and cauda equina: Conus extends to the T12 level. See lower thoracic spinal cord details on the comparison MRI today. No abnormal intradural enhancement. Capacious lumbar spinal canal. Normal cauda equina nerve roots.   Paraspinal and other soft tissues: Lumbar paraspinal atrophy, severe on the left.   Negative visible abdominal viscera.   Disc levels:   Negative T12-L1 and L1-L2 levels. And Capacious lumbar spinal canal, with no spinal stenosis despite disc bulging and posterior element hypertrophy at L2-L3 and L3-L4. Mild to moderate left L2 and right greater than left L3 foraminal stenosis noted.   L4-L5: Prior decompression and fusion with no adverse features.   L5-S1: Prior decompression and fusion with no adverse features.  IMPRESSION: 1. No acute osseous abnormality in the lumbar spine. See Thoracic Spine MRI today reported separately.   2. Prior decompression and fusion from L4 to S1 with no adverse features.   3. L2-L3  and L3-L4 disc and posterior element degeneration without spinal stenosis. Up to moderate left L2 and bilateral L3 foraminal stenosis.  ASSESSMENT: Cassidy Brown is a 88 y.o. female who presents for evaluation of increased difficulty with ambulation. She has a relevant medical history of breast cancer, diverticulosis, HTN, osteoporosis, lumbar stenosis s/p L4/5-L5/S1 fusion, poliomyelitis, anxiety. Her neurological examination is pertinent for weakness in the right lower limb in the L5 distribution. She has severe scoliosis and hyperreflexia throughout. I am most concerned about cervical spine stenosis and myelopathy as the cause for her increased difficulty with walking. I do not see clear signs of parkinsonism on exam today. The halting and shaking in her right foot I believe is due to dorsiflexor weakness and difficulty clearing her toes. This is likely residuals from old lumbosacral spine disease and surgical decompression.  PLAN: -Blood work: copper, vit E  -MRI cervical spine wo contrast -Continue PT home exercises for legs/back -May need neck PT but given that she just finished PT for low back/legs, will wait to see what MRI cervical spine shows   -Return to clinic in 3 months  The impression above as well as the plan as outlined below were extensively discussed with the patient (in the company of daughter) who voiced understanding. All questions were answered to their satisfaction.  The patient was counseled on pertinent fall precautions per the printed material provided today, and as noted under the "Patient Instructions" section below.  When available, results of the above investigations and possible further recommendations will be communicated to the patient via telephone/MyChart. Patient to call office if not contacted after expected testing turnaround time.   Total time spent reviewing records, interview, history/exam, documentation, and coordination of care on day of encounter:   85 min   Thank you for allowing me to participate in patient's care.  If I can answer any additional questions, I would be pleased to do so.  Kai Levins, MD   CC: Carollee Herter, Alferd Apa, DO [address] [place] [address]  CC: Referring provider: [name], DO [address] [place],  [place] [address]

## 2022-03-24 ENCOUNTER — Other Ambulatory Visit (INDEPENDENT_AMBULATORY_CARE_PROVIDER_SITE_OTHER): Payer: Medicare Other

## 2022-03-24 ENCOUNTER — Encounter: Payer: Self-pay | Admitting: Neurology

## 2022-03-24 ENCOUNTER — Ambulatory Visit: Payer: Medicare Other | Admitting: Neurology

## 2022-03-24 VITALS — BP 167/88 | HR 98 | Ht 60.0 in | Wt 98.0 lb

## 2022-03-24 DIAGNOSIS — R29898 Other symptoms and signs involving the musculoskeletal system: Secondary | ICD-10-CM | POA: Diagnosis not present

## 2022-03-24 DIAGNOSIS — M4802 Spinal stenosis, cervical region: Secondary | ICD-10-CM | POA: Diagnosis not present

## 2022-03-24 DIAGNOSIS — M542 Cervicalgia: Secondary | ICD-10-CM

## 2022-03-24 DIAGNOSIS — R2681 Unsteadiness on feet: Secondary | ICD-10-CM | POA: Diagnosis not present

## 2022-03-24 NOTE — Patient Instructions (Signed)
I saw you today for difficulty walking.  I see weakness in your right leg. This is likely residuals of prior spine disease for which you had surgery. I think the new difficulty is related to possible pressure on your cervical spine in your neck.  I do not see clear signs of Parkinson's disease today.  I would like to get lab work today.  I would like to get an MRI of your neck (cervical spine).  Continue doing your home exercises given by PT. We may need to do PT of your neck as well.  I will call you when I have your results.  I want to see you back in clinic in 3 months.  The physicians and staff at [facility] are committed to providing excellent care. You may receive a survey requesting feedback about your experience at our office. We strive to receive "very good" responses to the survey questions. If you feel that your experience would prevent you from giving the office a "very good " response, please contact our office to try to remedy the situation. We may be reached at [phone]. Thank you for taking the time out of your busy day to complete the survey.  Kai Levins, MD [facility]  Preventing Falls at [facility] are common, often dreaded events in the lives of older people. Aside from the obvious injuries and even death that may result, fall can cause wide-ranging consequences including loss of independence, mental decline, decreased activity and mobility. Younger people are also at risk of falling, especially those with chronic illnesses and fatigue.  Ways to reduce risk for falling Examine diet and medications. Warm foods and alcohol dilate blood vessels, which can lead to dizziness when standing. Sleep aids, antidepressants and pain medications can also increase the likelihood of a fall.  Get a vision exam. Poor vision, cataracts and glaucoma increase the chances of falling.  Check foot gear. Shoes should fit snugly and have a sturdy, nonskid sole and a  broad, low heel  Participate in a physician-approved exercise program to build and maintain muscle strength and improve balance and coordination. Programs that use ankle weights or stretch bands are excellent for muscle-strengthening. Water aerobics programs and low-impact Tai Chi programs have also been shown to improve balance and coordination.  Increase vitamin D intake. Vitamin D improves muscle strength and increases the amount of calcium the body is able to absorb and deposit in bones.  How to prevent falls from common hazards Floors - Remove all loose wires, cords, and throw rugs. Minimize clutter. Make sure rugs are anchored and smooth. Keep furniture in its usual place.  Chairs -- Use chairs with straight backs, armrests and firm seats. Add firm cushions to existing pieces to add height.  Bathroom - Install grab bars and non-skid tape in the tub or shower. Use a bathtub transfer bench or a shower chair with a back support Use an elevated toilet seat and/or safety rails to assist standing from a low surface. Do not use towel racks or bathroom tissue holders to help you stand.  Lighting - Make sure halls, stairways, and entrances are well-lit. Install a night light in your bathroom or hallway. Make sure there is a light switch at the top and bottom of the staircase. Turn lights on if you get up in the middle of the night. Make sure lamps or light switches are within reach of the bed if you have to get up during the night.  Kitchen -  Install non-skid rubber mats near the sink and stove. Clean spills immediately. Store frequently used utensils, pots, pans between waist and eye level. This helps prevent reaching and bending. Sit when getting things out of lower cupboards.  Living room/ Bedrooms - Place furniture with wide spaces in between, giving enough room to move around. Establish a route through the living room that gives you something to hold onto as you walk.  Stairs - Make sure treads,  rails, and rugs are secure. Install a rail on both sides of the stairs. If stairs are a threat, it might be helpful to arrange most of your activities on the lower level to reduce the number of times you must climb the stairs.  Entrances and doorways - Install metal handles on the walls adjacent to the doorknobs of all doors to make it more secure as you travel through the doorway.  Tips for maintaining balance Keep at least one hand free at all times. Try using a backpack or fanny pack to hold things rather than carrying them in your hands. Never carry objects in both hands when walking as this interferes with keeping your balance.  Attempt to swing both arms from front to back while walking. This might require a conscious effort if Parkinson's disease has diminished your movement. It will, however, help you to maintain balance and posture, and reduce fatigue.  Consciously lift your feet off of the ground when walking. Shuffling and dragging of the feet is a common culprit in losing your balance.  When trying to navigate turns, use a "U" technique of facing forward and making a wide turn, rather than pivoting sharply.  Try to stand with your feet shoulder-length apart. When your feet are close together for any length of time, you increase your risk of losing your balance and falling.  Do one thing at a time. Don't try to walk and accomplish another task, such as reading or looking around. The decrease in your automatic reflexes complicates motor function, so the less distraction, the better.  Do not wear rubber or gripping soled shoes, they might "catch" on the floor and cause tripping.  Move slowly when changing positions. Use deliberate, concentrated movements and, if needed, use a grab bar or walking aid. Count 15 seconds between each movement. For example, when rising from a seated position, wait 15 seconds after standing to begin walking.  If balance is a continuous problem, you might want  to consider a walking aid such as a cane, walking stick, or walker. Once you've mastered walking with help, you might be ready to try it on your own again.

## 2022-03-27 LAB — COPPER, SERUM: Copper: 91 ug/dL (ref 70–175)

## 2022-03-27 LAB — VITAMIN E
Gamma-Tocopherol (Vit E): <1 mg/L (ref <=4.3)
Vitamin E (Alpha Tocopherol): 40 mg/L — ABNORMAL HIGH (ref 5.7–19.9)

## 2022-03-29 ENCOUNTER — Ambulatory Visit (INDEPENDENT_AMBULATORY_CARE_PROVIDER_SITE_OTHER): Payer: Medicare Other | Admitting: Family Medicine

## 2022-03-29 VITALS — BP 160/90 | HR 82 | Temp 98.0°F | Resp 18 | Ht 60.0 in | Wt 98.0 lb

## 2022-03-29 DIAGNOSIS — E871 Hypo-osmolality and hyponatremia: Secondary | ICD-10-CM | POA: Diagnosis not present

## 2022-03-29 DIAGNOSIS — M810 Age-related osteoporosis without current pathological fracture: Secondary | ICD-10-CM | POA: Diagnosis not present

## 2022-03-29 DIAGNOSIS — F419 Anxiety disorder, unspecified: Secondary | ICD-10-CM

## 2022-03-29 DIAGNOSIS — M4120 Other idiopathic scoliosis, site unspecified: Secondary | ICD-10-CM

## 2022-03-29 DIAGNOSIS — I10 Essential (primary) hypertension: Secondary | ICD-10-CM

## 2022-03-29 DIAGNOSIS — Z23 Encounter for immunization: Secondary | ICD-10-CM | POA: Diagnosis not present

## 2022-03-29 DIAGNOSIS — K589 Irritable bowel syndrome without diarrhea: Secondary | ICD-10-CM

## 2022-03-29 DIAGNOSIS — E559 Vitamin D deficiency, unspecified: Secondary | ICD-10-CM

## 2022-03-29 DIAGNOSIS — Z111 Encounter for screening for respiratory tuberculosis: Secondary | ICD-10-CM

## 2022-03-29 NOTE — Progress Notes (Signed)
Subjective:   By signing my name below, I, Cassidy Brown, attest that this documentation has been prepared under the direction and in the presence of [name], DO. 03/29/2022    Patient ID: Cassidy Brown, female    DOB: [birth year], 88 y.o.   MRN: [id number]  No chief complaint on file.   HPI Patient is in today for a office visit. She is present with her daughter during this visit.   She is requesting to have forms filled for her future assisted living facility.  Her daughter reports they found her vitamin E levels were elevated while taking blood work at her neurologist appointment. She is taking vitamin E supplements per her eye doctor.  She has no changes to her family medical history. She need mild assistance while bathing. She is ambulatory with a walker device. She denies having urinary incontinence. Her bowel movements are normal. She denies having any sores from sitting or laying for long periods.  She is interested in receiving the flu vaccine at this time. She has a medical history of polio, hyponatremia, anxiety, osteoporosis, glaucoma, cervical stenosis, hypertension, IBS, breast cancer.  Immunization History  Administered Date(s) Administered   Fluad Quad(high Dose 65+) 04/02/2019, 05/11/2021   Influenza Split 04/27/2011, 03/27/2012   Influenza Whole 05/03/2007, 03/07/2008, 03/11/2009, 03/16/2010, 04/27/2010   Influenza, High Dose Seasonal PF 04/09/2013, 03/28/2016, 03/17/2017, 04/04/2018   Influenza,inj,Quad PF,6+ Mos 03/20/2014, 03/20/2015   PFIZER Comirnaty(Gray Top)Covid-19 Tri-Sucrose Vaccine 07/03/2019, 07/24/2019   Pneumococcal Conjugate-13 03/20/2015   Pneumococcal Polysaccharide-23 03/11/2009    Current Outpatient Medications on File Prior to Visit  Medication Sig Dispense Refill   acetaminophen (TYLENOL) 500 MG tablet Take 500 mg by mouth in the morning and at bedtime.     b complex vitamins tablet Take 1 tablet by mouth daily. Unknown strength      calcium citrate-vitamin D (CITRACAL+D) 315-200 MG-UNIT per tablet Take 1 tablet by mouth 3 (three) times daily.     dicyclomine (BENTYL) 20 MG tablet Take 1 tablet (20 mg total) by mouth 4 (four) times daily -  before meals and at bedtime. 45 tablet 1   diltiazem (CARDIZEM CD) 360 MG 24 hr capsule Take 1 capsule (360 mg total) by mouth daily. 90 capsule 3   ibuprofen (ADVIL) 200 MG tablet Take 200 mg by mouth in the morning and at bedtime.     irbesartan (AVAPRO) 300 MG tablet Take 1 tablet (300 mg total) by mouth daily. 90 tablet 3   latanoprost (XALATAN) 0.005 % ophthalmic solution Place 1 drop into both eyes at bedtime.     loratadine (CLARITIN) 10 MG tablet Take 10 mg by mouth daily as needed for allergies.     LORazepam (ATIVAN) 1 MG tablet TAKE 1 TABLET EVERY 8 HOURS (Patient taking differently: Take 1 mg by mouth as needed for anxiety. TAKE 1 TABLET EVERY 8 HOURS) 60 tablet 0   Magnesium Citrate 100 MG TABS Take 150 mg by mouth daily.     MAGNESIUM PO Take 500 mg by mouth daily.     meclizine (ANTIVERT) 25 MG tablet Take 1 tablet (25 mg total) by mouth 3 (three) times daily as needed. (Patient taking differently: Take 25 mg by mouth 3 (three) times daily as needed for dizziness.) 60 tablet 1   multivitamin-lutein (OCUVITE-LUTEIN) CAPS Take 1 capsule by mouth daily. Unknown strength     NONFORMULARY OR COMPOUNDED ITEM Light weight walker #1  As directed    Dx--balance issues,  Osteoporosis, dizziness 1 each 0   NONFORMULARY OR COMPOUNDED ITEM Manual wheelchair  #1  as directed ----  dx weakness, abnormal gait 1 each 0   simethicone (MYLICON) 242 MG chewable tablet Chew 125 mg by mouth in the morning, at noon, and at bedtime.     sodium chloride 1 g tablet Take 1 tablet (1 g total) by mouth 2 (two) times daily with a meal. 60 tablet 2   spironolactone (ALDACTONE) 25 MG tablet TAKE ONE-HALF TABLET BY MOUTH  DAILY (Patient not taking: Reported on 03/24/2022) 45 tablet 2   No current  facility-administered medications on file prior to visit.    Past Medical History:  Diagnosis Date   Allergy    Anxiety 07/20/2017   Backache 06/15/2010   Qualifier: Diagnosis of  By: Jerold Coombe     Bleeding nose 08/03/2017   Last Assessment & Plan:  Formatting of this note might be different from the original. Concern over nosebleeds. Chronic history of intermittent bleeding primarily from the right nostril.  Has never required packing or cautery.  No other anticoagulant therapy. EXAM shows excoriated area right anterior nasal septum. PLAN: Cauterized right nasal septum with silver nitrate.  Follow-up as needed.   Bradycardia 02/25/2019   BREAST CANCER, HX OF 03/07/2008   Qualifier: Diagnosis of  By: Jerold Coombe     Cancer [facility])    breast,hx of   Diverticulosis    DIVERTICULOSIS, COLON 10/13/2006   Qualifier: Diagnosis of  By: Jerold Coombe     DIZZINESS OR VERTIGO 10/13/2006   Qualifier: Diagnosis of  By: Jerold Coombe     Dyspnea on exertion 02/25/2019   Essential hypertension 04/02/2017   Glaucoma    Hypertension    HYPERTENSION 10/13/2006   Qualifier: Diagnosis of  By: Etter Sjogren DO,      Loose stools 04/30/2018   Osteoporosis    OSTEOPOROSIS 10/13/2006   Qualifier: Diagnosis of  By: Jerold Coombe     Pansinusitis 04/02/2017   Poliomyelitis    POLIOMYELITIS, HX OF 11/20/2007   Qualifier: Diagnosis of  By: Jerold Coombe     SCOLIOSIS 11/20/2007   Qualifier: Diagnosis of  By: Jerold Coombe     Sensation of fullness in ear 08/03/2017   Last Assessment & Plan:  Formatting of this note might be different from the original. Concern over fullness at the right ear. 41-monthhistory of fullness at the right ear.  She cannot use topical nasal steroid sprays due to epistaxis.  She is not improving. EXAM shows decreased movement of the right tympanic membrane on insufflation.  Left moves well.  Weber tuning fork lateralizes of the right e   Vertigo    dizziness    Past  Surgical History:  Procedure Laterality Date   BREAST SURGERY Left    Lumpectomy   DILATION AND CURETTAGE OF UTERUS     x3   ganglion cyst r wrist     LUMBAR LAMINECTOMY     SPINE SURGERY  11/2010   fusion--jenkins    Family History  Problem Relation Age of Onset   Hypertension Father    Kidney disease Father    Hypertension Sister    Coronary artery disease Other        P uncles    Social History   Socioeconomic History   Marital status: Married    Spouse name: Not on file   Number of children: Not on file   Years  of education: Not on file   Highest education level: Not on file  Occupational History   Occupation: [employer] --retired    Fish farm manager: RETIRED  Tobacco Use   Smoking status: Never   Smokeless tobacco: Never  Vaping Use   Vaping Use: Never used  Substance and Sexual Activity   Alcohol use: No   Drug use: No   Sexual activity: Never    Partners: Male  Other Topics Concern   Not on file  Social History Narrative   Exercise-- walking 1 x a week   Right handed    Caffeine 1 cup daily 1/2 & 1/2   Social Determinants of Health   Financial Resource Strain: Not on file  Food Insecurity: Not on file  Transportation Needs: Not on file  Physical Activity: Not on file  Stress: Not on file  Social Connections: Not on file  Intimate Partner Violence: Not on file    Outpatient Medications Prior to Visit  Medication Sig Dispense Refill   acetaminophen (TYLENOL) 500 MG tablet Take 500 mg by mouth in the morning and at bedtime.     b complex vitamins tablet Take 1 tablet by mouth daily. Unknown strength     calcium citrate-vitamin D (CITRACAL+D) 315-200 MG-UNIT per tablet Take 1 tablet by mouth 3 (three) times daily.     dicyclomine (BENTYL) 20 MG tablet Take 1 tablet (20 mg total) by mouth 4 (four) times daily -  before meals and at bedtime. 45 tablet 1   diltiazem (CARDIZEM CD) 360 MG 24 hr capsule Take 1 capsule (360 mg total) by mouth daily. 90  capsule 3   ibuprofen (ADVIL) 200 MG tablet Take 200 mg by mouth in the morning and at bedtime.     irbesartan (AVAPRO) 300 MG tablet Take 1 tablet (300 mg total) by mouth daily. 90 tablet 3   latanoprost (XALATAN) 0.005 % ophthalmic solution Place 1 drop into both eyes at bedtime.     loratadine (CLARITIN) 10 MG tablet Take 10 mg by mouth daily as needed for allergies.     LORazepam (ATIVAN) 1 MG tablet TAKE 1 TABLET EVERY 8 HOURS (Patient taking differently: Take 1 mg by mouth as needed for anxiety. TAKE 1 TABLET EVERY 8 HOURS) 60 tablet 0   Magnesium Citrate 100 MG TABS Take 150 mg by mouth daily.     MAGNESIUM PO Take 500 mg by mouth daily.     meclizine (ANTIVERT) 25 MG tablet Take 1 tablet (25 mg total) by mouth 3 (three) times daily as needed. (Patient taking differently: Take 25 mg by mouth 3 (three) times daily as needed for dizziness.) 60 tablet 1   multivitamin-lutein (OCUVITE-LUTEIN) CAPS Take 1 capsule by mouth daily. Unknown strength     NONFORMULARY OR COMPOUNDED ITEM Light weight walker #1  As directed    Dx--balance issues,  Osteoporosis, dizziness 1 each 0   NONFORMULARY OR COMPOUNDED ITEM Manual wheelchair  #1  as directed ----  dx weakness, abnormal gait 1 each 0   simethicone (MYLICON) 010 MG chewable tablet Chew 125 mg by mouth in the morning, at noon, and at bedtime.     sodium chloride 1 g tablet Take 1 tablet (1 g total) by mouth 2 (two) times daily with a meal. 60 tablet 2   spironolactone (ALDACTONE) 25 MG tablet TAKE ONE-HALF TABLET BY MOUTH  DAILY (Patient not taking: Reported on 03/24/2022) 45 tablet 2   No facility-administered medications prior to visit.  Allergies  Allergen Reactions   Amoxicillin Diarrhea    "I had diarrhea really bad."   Coreg [Carvedilol] Diarrhea   Hydrocodone    Plendil [Felodipine]     Caused pains in back and stomach and caused constipation   Propoxyphene N-Acetaminophen    Sertraline Hcl Other (See Comments)    Per patient  shaking, Headache and weird feeling in her eyes.   Tenex [Guanfacine Hcl]     Made patient feel really bad and was ineffective in decreasing BP    Review of Systems  Constitutional:  Negative for fever and malaise/fatigue.  HENT:  Negative for congestion.   Eyes:  Negative for blurred vision.  Respiratory:  Negative for shortness of breath.   Cardiovascular:  Negative for chest pain, palpitations and leg swelling.  Gastrointestinal:  Negative for abdominal pain, blood in stool and nausea.  Genitourinary:  Negative for dysuria and frequency.  Musculoskeletal:  Negative for falls.  Skin:  Negative for rash.  Neurological:  Positive for weakness. Negative for dizziness, loss of consciousness and headaches.  Endo/Heme/Allergies:  Negative for environmental allergies.  Psychiatric/Behavioral:  Negative for depression. The patient is not nervous/anxious.        Objective:    Physical Exam Vitals and nursing note reviewed.  Constitutional:      General: She is not in acute distress.    Appearance: Normal appearance. She is not ill-appearing.  HENT:     Head: Normocephalic and atraumatic.     Right Ear: External ear normal.     Left Ear: External ear normal.  Eyes:     Extraocular Movements: Extraocular movements intact.     Pupils: Pupils are equal, round, and reactive to light.  Cardiovascular:     Rate and Rhythm: Normal rate and regular rhythm.     Heart sounds: Normal heart sounds. No murmur heard.    No gallop.  Pulmonary:     Effort: Pulmonary effort is normal. No respiratory distress.     Breath sounds: Normal breath sounds. No wheezing or rales.  Skin:    General: Skin is warm and dry.  Neurological:     Mental Status: She is alert and oriented to person, place, and time.  Psychiatric:        Judgment: Judgment normal.     BP (!) 160/90 (BP Location: Left Arm, Patient Position: Sitting, Cuff Size: Normal)   Pulse 82   Temp 98 F (36.7 C) (Oral)   Resp 18   Ht  5' (1.524 m)   Wt 98 lb (44.5 kg)   SpO2 94%   BMI 19.14 kg/m  Wt Readings from Last 3 Encounters:  03/29/22 98 lb (44.5 kg)  03/24/22 98 lb (44.5 kg)  11/18/21 100 lb 9.6 oz (45.6 kg)    Diabetic Foot Exam - Simple   No data filed    Lab Results  Component Value Date   WBC 5.7 03/29/2022   HGB 12.7 03/29/2022   HCT 38.5 03/29/2022   PLT 274.0 03/29/2022   GLUCOSE 82 03/29/2022   CHOL 174 03/29/2022   TRIG 68.0 03/29/2022   HDL 102.30 03/29/2022   LDLCALC 58 03/29/2022   ALT 21 03/29/2022   AST 25 03/29/2022   NA 134 (L) 03/29/2022   K 3.7 03/29/2022   CL 95 (L) 03/29/2022   CREATININE 0.56 03/29/2022   BUN 14 03/29/2022   CO2 30 03/29/2022   TSH 1.70 11/18/2021   HGBA1C 5.5 08/20/2013   MICROALBUR 0.9 09/02/2014  Lab Results  Component Value Date   TSH 1.70 11/18/2021   Lab Results  Component Value Date   WBC 5.7 03/29/2022   HGB 12.7 03/29/2022   HCT 38.5 03/29/2022   MCV 87.2 03/29/2022   PLT 274.0 03/29/2022   Lab Results  Component Value Date   NA 134 (L) 03/29/2022   K 3.7 03/29/2022   CO2 30 03/29/2022   GLUCOSE 82 03/29/2022   BUN 14 03/29/2022   CREATININE 0.56 03/29/2022   BILITOT 0.4 03/29/2022   ALKPHOS 56 03/29/2022   AST 25 03/29/2022   ALT 21 03/29/2022   PROT 7.1 03/29/2022   ALBUMIN 4.5 03/29/2022   CALCIUM 10.0 03/29/2022   ANIONGAP 8 08/29/2017   GFR 80.50 03/29/2022   Lab Results  Component Value Date   CHOL 174 03/29/2022   Lab Results  Component Value Date   HDL 102.30 03/29/2022   Lab Results  Component Value Date   LDLCALC 58 03/29/2022   Lab Results  Component Value Date   TRIG 68.0 03/29/2022   Lab Results  Component Value Date   CHOLHDL 2 03/29/2022   Lab Results  Component Value Date   HGBA1C 5.5 08/20/2013       Assessment & Plan:   Problem List Items Addressed This Visit       Unprioritized   Irritable bowel syndrome   Anxiety   Hyponatremia - Primary   Relevant Orders   CBC with  Differential/Platelet (Completed)   Comprehensive metabolic panel (Completed)   Lipid panel (Completed)   QuantiFERON-TB Gold Plus   Osteoporosis    Ca and vita d  Pt walks with walker        Idiopathic scoliosis and kyphoscoliosis    stable      Essential hypertension    Well controlled, no changes to meds. Encouraged heart healthy diet such as the DASH diet and exercise as tolerated.  Slightly high today      Other Visit Diagnoses     Primary hypertension       Relevant Orders   CBC with Differential/Platelet (Completed)   Comprehensive metabolic panel (Completed)   Lipid panel (Completed)   QuantiFERON-TB Gold Plus   Vitamin D deficiency       Relevant Orders   VITAMIN D 25 Hydroxy (Vit-D Deficiency, Fractures) (Completed)   Encounter for TB tine test       Relevant Orders   QuantiFERON-TB Gold Plus   Need for influenza vaccination       Relevant Orders   Flu Vaccine QUAD High Dose(Fluad)        No orders of the defined types were placed in this encounter.   IAnn Held, DO, personally preformed the services described in this documentation.  All medical record entries made by the scribe were at my direction and in my presence.  I have reviewed the chart and discharge instructions (if applicable) and agree that the record reflects my personal performance and is accurate and complete. 03/29/2022   I,Cassidy Brown,acting as a scribe for [name], DO.,have documented all relevant documentation on the behalf of [name], DO,as directed by  [name], DO while in the presence of [name], DO.   [name], DO

## 2022-03-30 LAB — CBC WITH DIFFERENTIAL/PLATELET
Basophils Absolute: 0.1 10*3/uL (ref 0.0–0.1)
Basophils Relative: 1.4 % (ref 0.0–3.0)
Eosinophils Absolute: 0 10*3/uL (ref 0.0–0.7)
Eosinophils Relative: 0.7 % (ref 0.0–5.0)
HCT: 38.5 % (ref 36.0–46.0)
Hemoglobin: 12.7 g/dL (ref 12.0–15.0)
Lymphocytes Relative: 16.4 % (ref 12.0–46.0)
Lymphs Abs: 0.9 10*3/uL (ref 0.7–4.0)
MCHC: 32.9 g/dL (ref 30.0–36.0)
MCV: 87.2 fl (ref 78.0–100.0)
Monocytes Absolute: 0.6 10*3/uL (ref 0.1–1.0)
Monocytes Relative: 10.2 % (ref 3.0–12.0)
Neutro Abs: 4.1 10*3/uL (ref 1.4–7.7)
Neutrophils Relative %: 71.3 % (ref 43.0–77.0)
Platelets: 274 10*3/uL (ref 150.0–400.0)
RBC: 4.42 Mil/uL (ref 3.87–5.11)
RDW: 14.5 % (ref 11.5–15.5)
WBC: 5.7 10*3/uL (ref 4.0–10.5)

## 2022-03-30 LAB — LIPID PANEL
Cholesterol: 174 mg/dL (ref 0–200)
HDL: 102.3 mg/dL (ref >39.00)
LDL Cholesterol: 58 mg/dL (ref 0–99)
NonHDL: 71.79
Total CHOL/HDL Ratio: 2
Triglycerides: 68 mg/dL (ref 0.0–149.0)
VLDL: 13.6 mg/dL (ref 0.0–40.0)

## 2022-03-30 LAB — COMPREHENSIVE METABOLIC PANEL
ALT: 21 U/L (ref 0–35)
AST: 25 U/L (ref 0–37)
Albumin: 4.5 g/dL (ref 3.5–5.2)
Alkaline Phosphatase: 56 U/L (ref 39–117)
BUN: 14 mg/dL (ref 6–23)
CO2: 30 mEq/L (ref 19–32)
Calcium: 10 mg/dL (ref 8.4–10.5)
Chloride: 95 mEq/L — ABNORMAL LOW (ref 96–112)
Creatinine, Ser: 0.56 mg/dL (ref 0.40–1.20)
GFR: 80.5 mL/min (ref >60.00)
Glucose, Bld: 82 mg/dL (ref 70–99)
Potassium: 3.7 mEq/L (ref 3.5–5.1)
Sodium: 134 mEq/L — ABNORMAL LOW (ref 135–145)
Total Bilirubin: 0.4 mg/dL (ref 0.2–1.2)
Total Protein: 7.1 g/dL (ref 6.0–8.3)

## 2022-03-30 LAB — VITAMIN D 25 HYDROXY (VIT D DEFICIENCY, FRACTURES): VITD: 48.88 ng/mL (ref 30.00–100.00)

## 2022-03-30 NOTE — Assessment & Plan Note (Signed)
bp slightly high today

## 2022-03-31 NOTE — Assessment & Plan Note (Signed)
Well controlled, no changes to meds. Encouraged heart healthy diet such as the DASH diet and exercise as tolerated.  Slightly high today

## 2022-03-31 NOTE — Assessment & Plan Note (Signed)
stable °

## 2022-03-31 NOTE — Assessment & Plan Note (Signed)
Ca and vita d  Pt walks with walker

## 2022-04-01 ENCOUNTER — Encounter: Payer: Self-pay | Admitting: Family Medicine

## 2022-04-02 LAB — QUANTIFERON-TB GOLD PLUS
Mitogen-NIL: 5.85 IU/mL
NIL: 0.02 IU/mL
QuantiFERON-TB Gold Plus: NEGATIVE
TB1-NIL: 0 IU/mL
TB2-NIL: 0 IU/mL

## 2022-04-04 ENCOUNTER — Telehealth: Payer: Self-pay

## 2022-04-04 NOTE — Telephone Encounter (Signed)
FL2 form completed and faxed with medication/allergy, vaccine, and labs.

## 2022-04-06 ENCOUNTER — Ambulatory Visit (HOSPITAL_COMMUNITY)
Admission: RE | Admit: 2022-04-06 | Discharge: 2022-04-06 | Disposition: A | Discharge status: Home / Self Care | Payer: Medicare Other | Source: Ambulatory Visit | Attending: Neurology | Admitting: Neurology

## 2022-04-06 DIAGNOSIS — R2681 Unsteadiness on feet: Secondary | ICD-10-CM | POA: Diagnosis not present

## 2022-04-06 DIAGNOSIS — M4802 Spinal stenosis, cervical region: Secondary | ICD-10-CM | POA: Diagnosis not present

## 2022-04-06 DIAGNOSIS — M542 Cervicalgia: Secondary | ICD-10-CM | POA: Insufficient documentation

## 2022-04-06 DIAGNOSIS — R29898 Other symptoms and signs involving the musculoskeletal system: Secondary | ICD-10-CM | POA: Insufficient documentation

## 2022-04-06 DIAGNOSIS — M4803 Spinal stenosis, cervicothoracic region: Secondary | ICD-10-CM | POA: Diagnosis not present

## 2022-04-06 DIAGNOSIS — M4319 Spondylolisthesis, multiple sites in spine: Secondary | ICD-10-CM | POA: Diagnosis not present

## 2022-04-06 DIAGNOSIS — M47812 Spondylosis without myelopathy or radiculopathy, cervical region: Secondary | ICD-10-CM | POA: Diagnosis not present

## 2022-04-19 NOTE — Telephone Encounter (Signed)
Noted. FL2 not in S-drive yet

## 2022-04-19 NOTE — Telephone Encounter (Signed)
[employer] called stating they will be faxing the FL2 forms to [place] for PCP to sign since it needed a few changes. FL2 form needs to be signed and faxed back to them today or else the pt won't be able to move today. Fax number was verified.

## 2022-04-20 DIAGNOSIS — E871 Hypo-osmolality and hyponatremia: Secondary | ICD-10-CM | POA: Diagnosis not present

## 2022-04-20 DIAGNOSIS — I10 Essential (primary) hypertension: Secondary | ICD-10-CM | POA: Diagnosis not present

## 2022-04-21 ENCOUNTER — Telehealth: Payer: Self-pay | Admitting: Neurology

## 2022-04-21 NOTE — Telephone Encounter (Signed)
Cassidy Brown from [facility] needs PT verbal orders for home health PT evaluation.

## 2022-04-22 NOTE — Telephone Encounter (Signed)
Called and informed PT of orders Per Dr. Berdine Addison.

## 2022-04-26 NOTE — Telephone Encounter (Signed)
Deidre from [facility] called and said she needs a Dx code for the orders.

## 2022-04-27 DIAGNOSIS — I10 Essential (primary) hypertension: Secondary | ICD-10-CM | POA: Diagnosis not present

## 2022-04-27 DIAGNOSIS — E871 Hypo-osmolality and hyponatremia: Secondary | ICD-10-CM | POA: Diagnosis not present

## 2022-04-27 DIAGNOSIS — M4802 Spinal stenosis, cervical region: Secondary | ICD-10-CM | POA: Diagnosis not present

## 2022-04-27 DIAGNOSIS — M159 Polyosteoarthritis, unspecified: Secondary | ICD-10-CM | POA: Diagnosis not present

## 2022-04-27 DIAGNOSIS — M810 Age-related osteoporosis without current pathological fracture: Secondary | ICD-10-CM | POA: Diagnosis not present

## 2022-04-28 NOTE — Telephone Encounter (Signed)
Dx to provide: M48.02, R29.898, R26.81

## 2022-04-28 NOTE — Telephone Encounter (Signed)
Called Cassidy Brown and provided dx codes. She wanted to let Dr. Berdine Addison know that patients PCP will be overseeing this [place] as well for patient.

## 2022-04-29 DIAGNOSIS — M419 Scoliosis, unspecified: Secondary | ICD-10-CM | POA: Diagnosis not present

## 2022-04-29 DIAGNOSIS — M21061 Valgus deformity, not elsewhere classified, right knee: Secondary | ICD-10-CM | POA: Diagnosis not present

## 2022-04-29 DIAGNOSIS — I10 Essential (primary) hypertension: Secondary | ICD-10-CM | POA: Diagnosis not present

## 2022-04-29 DIAGNOSIS — M4802 Spinal stenosis, cervical region: Secondary | ICD-10-CM | POA: Diagnosis not present

## 2022-04-29 DIAGNOSIS — G8929 Other chronic pain: Secondary | ICD-10-CM | POA: Diagnosis not present

## 2022-05-02 DIAGNOSIS — M21061 Valgus deformity, not elsewhere classified, right knee: Secondary | ICD-10-CM | POA: Diagnosis not present

## 2022-05-02 DIAGNOSIS — G8929 Other chronic pain: Secondary | ICD-10-CM | POA: Diagnosis not present

## 2022-05-02 DIAGNOSIS — I10 Essential (primary) hypertension: Secondary | ICD-10-CM | POA: Diagnosis not present

## 2022-05-02 DIAGNOSIS — M419 Scoliosis, unspecified: Secondary | ICD-10-CM | POA: Diagnosis not present

## 2022-05-02 DIAGNOSIS — M4802 Spinal stenosis, cervical region: Secondary | ICD-10-CM | POA: Diagnosis not present

## 2022-05-11 DIAGNOSIS — U071 COVID-19: Secondary | ICD-10-CM | POA: Diagnosis not present

## 2022-05-11 DIAGNOSIS — M21061 Valgus deformity, not elsewhere classified, right knee: Secondary | ICD-10-CM | POA: Diagnosis not present

## 2022-05-11 DIAGNOSIS — G8929 Other chronic pain: Secondary | ICD-10-CM | POA: Diagnosis not present

## 2022-05-11 DIAGNOSIS — M419 Scoliosis, unspecified: Secondary | ICD-10-CM | POA: Diagnosis not present

## 2022-05-11 DIAGNOSIS — M4802 Spinal stenosis, cervical region: Secondary | ICD-10-CM | POA: Diagnosis not present

## 2022-05-11 DIAGNOSIS — I10 Essential (primary) hypertension: Secondary | ICD-10-CM | POA: Diagnosis not present

## 2022-05-11 DIAGNOSIS — Z66 Do not resuscitate: Secondary | ICD-10-CM | POA: Diagnosis not present

## 2022-05-18 ENCOUNTER — Ambulatory Visit: Payer: Medicare Other | Admitting: Cardiology

## 2022-05-19 DIAGNOSIS — M21061 Valgus deformity, not elsewhere classified, right knee: Secondary | ICD-10-CM | POA: Diagnosis not present

## 2022-05-19 DIAGNOSIS — I10 Essential (primary) hypertension: Secondary | ICD-10-CM | POA: Diagnosis not present

## 2022-05-19 DIAGNOSIS — M4802 Spinal stenosis, cervical region: Secondary | ICD-10-CM | POA: Diagnosis not present

## 2022-05-19 DIAGNOSIS — M419 Scoliosis, unspecified: Secondary | ICD-10-CM | POA: Diagnosis not present

## 2022-05-19 DIAGNOSIS — G8929 Other chronic pain: Secondary | ICD-10-CM | POA: Diagnosis not present

## 2022-05-20 DIAGNOSIS — G8929 Other chronic pain: Secondary | ICD-10-CM | POA: Diagnosis not present

## 2022-05-20 DIAGNOSIS — I10 Essential (primary) hypertension: Secondary | ICD-10-CM | POA: Diagnosis not present

## 2022-05-20 DIAGNOSIS — M419 Scoliosis, unspecified: Secondary | ICD-10-CM | POA: Diagnosis not present

## 2022-05-20 DIAGNOSIS — M4802 Spinal stenosis, cervical region: Secondary | ICD-10-CM | POA: Diagnosis not present

## 2022-05-20 DIAGNOSIS — M21061 Valgus deformity, not elsewhere classified, right knee: Secondary | ICD-10-CM | POA: Diagnosis not present

## 2022-05-24 DIAGNOSIS — M4802 Spinal stenosis, cervical region: Secondary | ICD-10-CM | POA: Diagnosis not present

## 2022-05-24 DIAGNOSIS — M419 Scoliosis, unspecified: Secondary | ICD-10-CM | POA: Diagnosis not present

## 2022-05-24 DIAGNOSIS — I10 Essential (primary) hypertension: Secondary | ICD-10-CM | POA: Diagnosis not present

## 2022-05-24 DIAGNOSIS — G8929 Other chronic pain: Secondary | ICD-10-CM | POA: Diagnosis not present

## 2022-05-24 DIAGNOSIS — M21061 Valgus deformity, not elsewhere classified, right knee: Secondary | ICD-10-CM | POA: Diagnosis not present

## 2022-05-25 DIAGNOSIS — G8929 Other chronic pain: Secondary | ICD-10-CM | POA: Diagnosis not present

## 2022-05-25 DIAGNOSIS — M4802 Spinal stenosis, cervical region: Secondary | ICD-10-CM | POA: Diagnosis not present

## 2022-05-25 DIAGNOSIS — I10 Essential (primary) hypertension: Secondary | ICD-10-CM | POA: Diagnosis not present

## 2022-05-25 DIAGNOSIS — M419 Scoliosis, unspecified: Secondary | ICD-10-CM | POA: Diagnosis not present

## 2022-05-25 DIAGNOSIS — M21061 Valgus deformity, not elsewhere classified, right knee: Secondary | ICD-10-CM | POA: Diagnosis not present

## 2022-05-31 DIAGNOSIS — G8929 Other chronic pain: Secondary | ICD-10-CM | POA: Diagnosis not present

## 2022-05-31 DIAGNOSIS — M4802 Spinal stenosis, cervical region: Secondary | ICD-10-CM | POA: Diagnosis not present

## 2022-05-31 DIAGNOSIS — M419 Scoliosis, unspecified: Secondary | ICD-10-CM | POA: Diagnosis not present

## 2022-05-31 DIAGNOSIS — I10 Essential (primary) hypertension: Secondary | ICD-10-CM | POA: Diagnosis not present

## 2022-05-31 DIAGNOSIS — M21061 Valgus deformity, not elsewhere classified, right knee: Secondary | ICD-10-CM | POA: Diagnosis not present

## 2022-06-01 DIAGNOSIS — M4802 Spinal stenosis, cervical region: Secondary | ICD-10-CM | POA: Diagnosis not present

## 2022-06-01 DIAGNOSIS — M21061 Valgus deformity, not elsewhere classified, right knee: Secondary | ICD-10-CM | POA: Diagnosis not present

## 2022-06-01 DIAGNOSIS — G8929 Other chronic pain: Secondary | ICD-10-CM | POA: Diagnosis not present

## 2022-06-01 DIAGNOSIS — I10 Essential (primary) hypertension: Secondary | ICD-10-CM | POA: Diagnosis not present

## 2022-06-01 DIAGNOSIS — M419 Scoliosis, unspecified: Secondary | ICD-10-CM | POA: Diagnosis not present

## 2022-06-08 ENCOUNTER — Telehealth: Payer: Self-pay | Admitting: *Deleted

## 2022-06-08 NOTE — Telephone Encounter (Signed)
LMOM for pt to schedule AWV. 

## 2022-06-09 DIAGNOSIS — I10 Essential (primary) hypertension: Secondary | ICD-10-CM | POA: Diagnosis not present

## 2022-06-09 DIAGNOSIS — M419 Scoliosis, unspecified: Secondary | ICD-10-CM | POA: Diagnosis not present

## 2022-06-09 DIAGNOSIS — G8929 Other chronic pain: Secondary | ICD-10-CM | POA: Diagnosis not present

## 2022-06-09 DIAGNOSIS — M4802 Spinal stenosis, cervical region: Secondary | ICD-10-CM | POA: Diagnosis not present

## 2022-06-09 DIAGNOSIS — M21061 Valgus deformity, not elsewhere classified, right knee: Secondary | ICD-10-CM | POA: Diagnosis not present

## 2022-06-14 DIAGNOSIS — G8929 Other chronic pain: Secondary | ICD-10-CM | POA: Diagnosis not present

## 2022-06-14 DIAGNOSIS — I10 Essential (primary) hypertension: Secondary | ICD-10-CM | POA: Diagnosis not present

## 2022-06-14 DIAGNOSIS — M4802 Spinal stenosis, cervical region: Secondary | ICD-10-CM | POA: Diagnosis not present

## 2022-06-14 DIAGNOSIS — M419 Scoliosis, unspecified: Secondary | ICD-10-CM | POA: Diagnosis not present

## 2022-06-14 DIAGNOSIS — M21061 Valgus deformity, not elsewhere classified, right knee: Secondary | ICD-10-CM | POA: Diagnosis not present

## 2022-06-23 DIAGNOSIS — I10 Essential (primary) hypertension: Secondary | ICD-10-CM | POA: Diagnosis not present

## 2022-06-23 DIAGNOSIS — E871 Hypo-osmolality and hyponatremia: Secondary | ICD-10-CM | POA: Diagnosis not present

## 2022-06-23 DIAGNOSIS — M419 Scoliosis, unspecified: Secondary | ICD-10-CM | POA: Diagnosis not present

## 2022-06-23 DIAGNOSIS — M21061 Valgus deformity, not elsewhere classified, right knee: Secondary | ICD-10-CM | POA: Diagnosis not present

## 2022-06-23 DIAGNOSIS — M4802 Spinal stenosis, cervical region: Secondary | ICD-10-CM | POA: Diagnosis not present

## 2022-06-23 DIAGNOSIS — G8929 Other chronic pain: Secondary | ICD-10-CM | POA: Diagnosis not present

## 2022-06-24 DIAGNOSIS — E871 Hypo-osmolality and hyponatremia: Secondary | ICD-10-CM | POA: Diagnosis not present

## 2022-07-01 ENCOUNTER — Ambulatory Visit: Payer: Medicare Other | Admitting: Neurology

## 2022-07-12 DIAGNOSIS — E119 Type 2 diabetes mellitus without complications: Secondary | ICD-10-CM | POA: Diagnosis not present

## 2022-07-12 DIAGNOSIS — I10 Essential (primary) hypertension: Secondary | ICD-10-CM | POA: Diagnosis not present

## 2022-07-13 DIAGNOSIS — M159 Polyosteoarthritis, unspecified: Secondary | ICD-10-CM | POA: Diagnosis not present

## 2022-07-13 DIAGNOSIS — E871 Hypo-osmolality and hyponatremia: Secondary | ICD-10-CM | POA: Diagnosis not present

## 2022-07-13 DIAGNOSIS — I10 Essential (primary) hypertension: Secondary | ICD-10-CM | POA: Diagnosis not present

## 2022-07-13 DIAGNOSIS — M4802 Spinal stenosis, cervical region: Secondary | ICD-10-CM | POA: Diagnosis not present

## 2022-07-13 DIAGNOSIS — M810 Age-related osteoporosis without current pathological fracture: Secondary | ICD-10-CM | POA: Diagnosis not present

## 2022-07-19 DIAGNOSIS — I10 Essential (primary) hypertension: Secondary | ICD-10-CM | POA: Diagnosis not present

## 2022-07-19 DIAGNOSIS — E119 Type 2 diabetes mellitus without complications: Secondary | ICD-10-CM | POA: Diagnosis not present

## 2022-07-20 DIAGNOSIS — E871 Hypo-osmolality and hyponatremia: Secondary | ICD-10-CM | POA: Diagnosis not present

## 2022-07-20 DIAGNOSIS — G44229 Chronic tension-type headache, not intractable: Secondary | ICD-10-CM | POA: Diagnosis not present

## 2022-07-20 DIAGNOSIS — I10 Essential (primary) hypertension: Secondary | ICD-10-CM | POA: Diagnosis not present

## 2022-08-02 DIAGNOSIS — I10 Essential (primary) hypertension: Secondary | ICD-10-CM | POA: Diagnosis not present

## 2022-08-04 DIAGNOSIS — I10 Essential (primary) hypertension: Secondary | ICD-10-CM | POA: Diagnosis not present

## 2022-08-04 DIAGNOSIS — E871 Hypo-osmolality and hyponatremia: Secondary | ICD-10-CM | POA: Diagnosis not present

## 2022-08-11 DIAGNOSIS — E612 Magnesium deficiency: Secondary | ICD-10-CM | POA: Diagnosis not present

## 2022-08-16 DIAGNOSIS — I10 Essential (primary) hypertension: Secondary | ICD-10-CM | POA: Diagnosis not present

## 2022-08-16 DIAGNOSIS — E781 Pure hyperglyceridemia: Secondary | ICD-10-CM | POA: Diagnosis not present

## 2022-08-16 DIAGNOSIS — E785 Hyperlipidemia, unspecified: Secondary | ICD-10-CM | POA: Diagnosis not present

## 2022-08-30 IMAGING — DX DG LUMBAR SPINE COMPLETE 4+V
5 series · 5 of 5 positions shown · non-contrast
Comparison: 09/02/2011

CLINICAL DATA: Low back pain following fall

EXAM:
LUMBAR SPINE - COMPLETE 4+ VIEW

[l-spine ap]
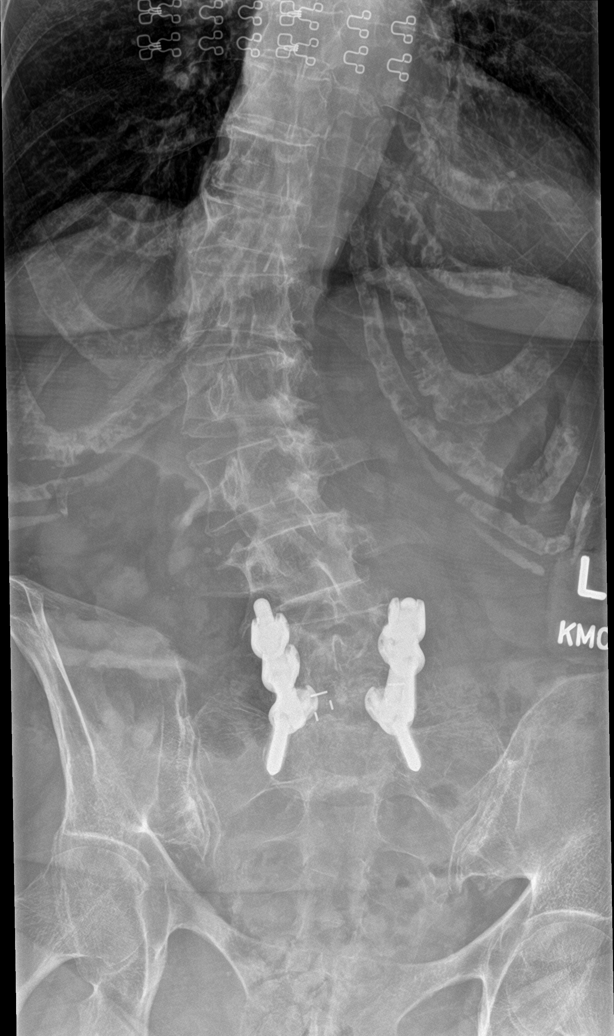

[l-spine obl (1 of 2)]
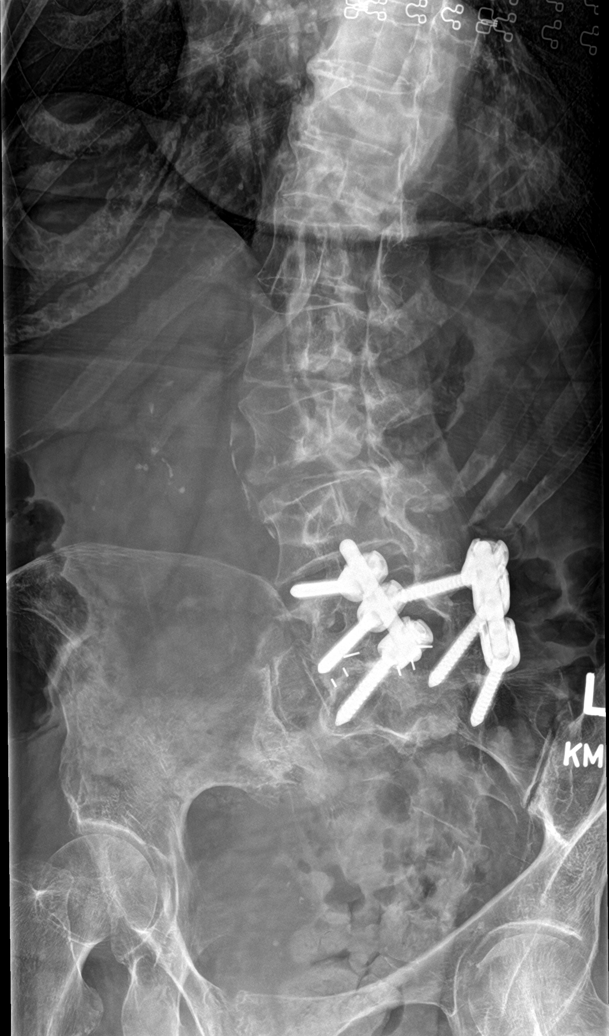

[l-spine obl (2 of 2)]
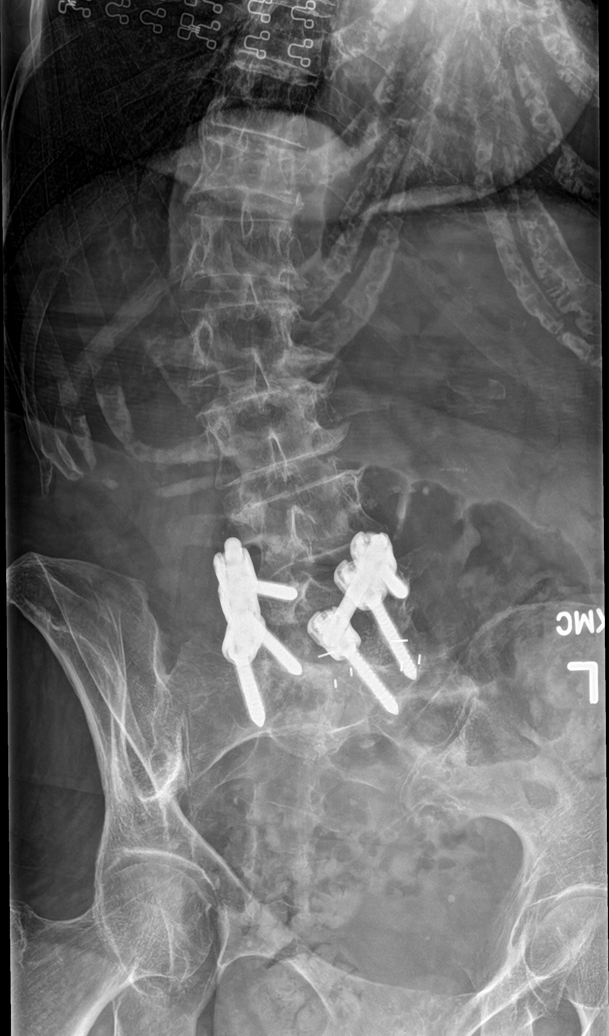

[l-spine lat]
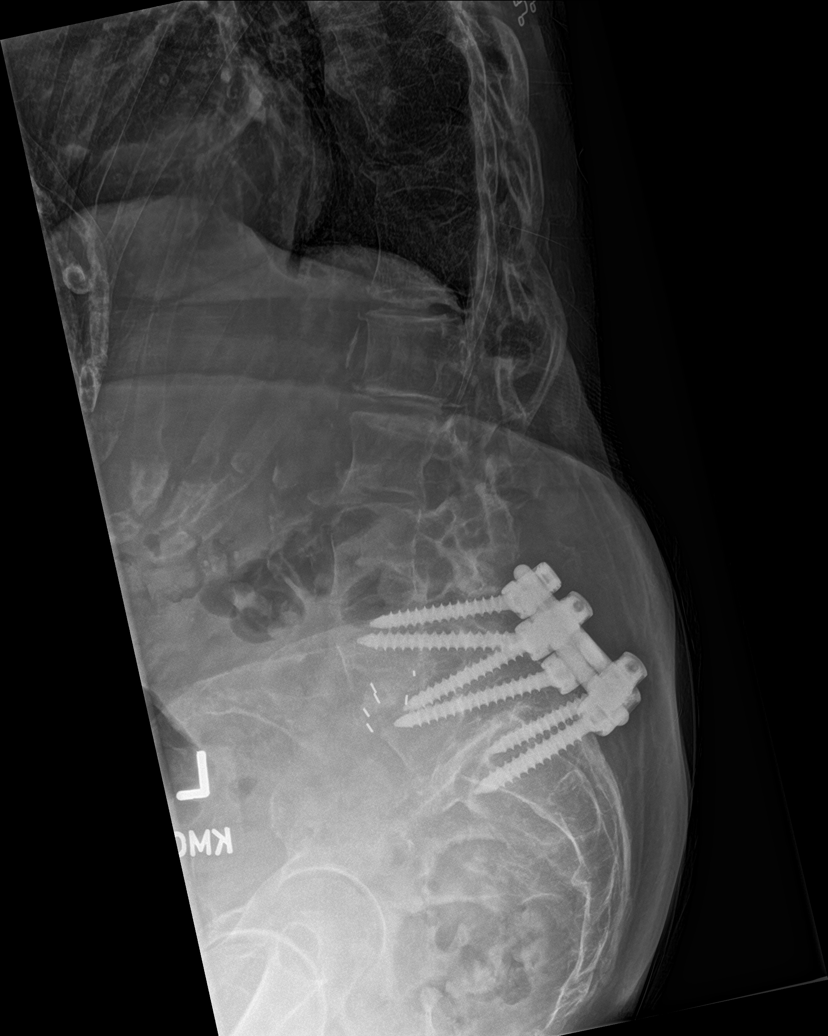

[l-spine spot]
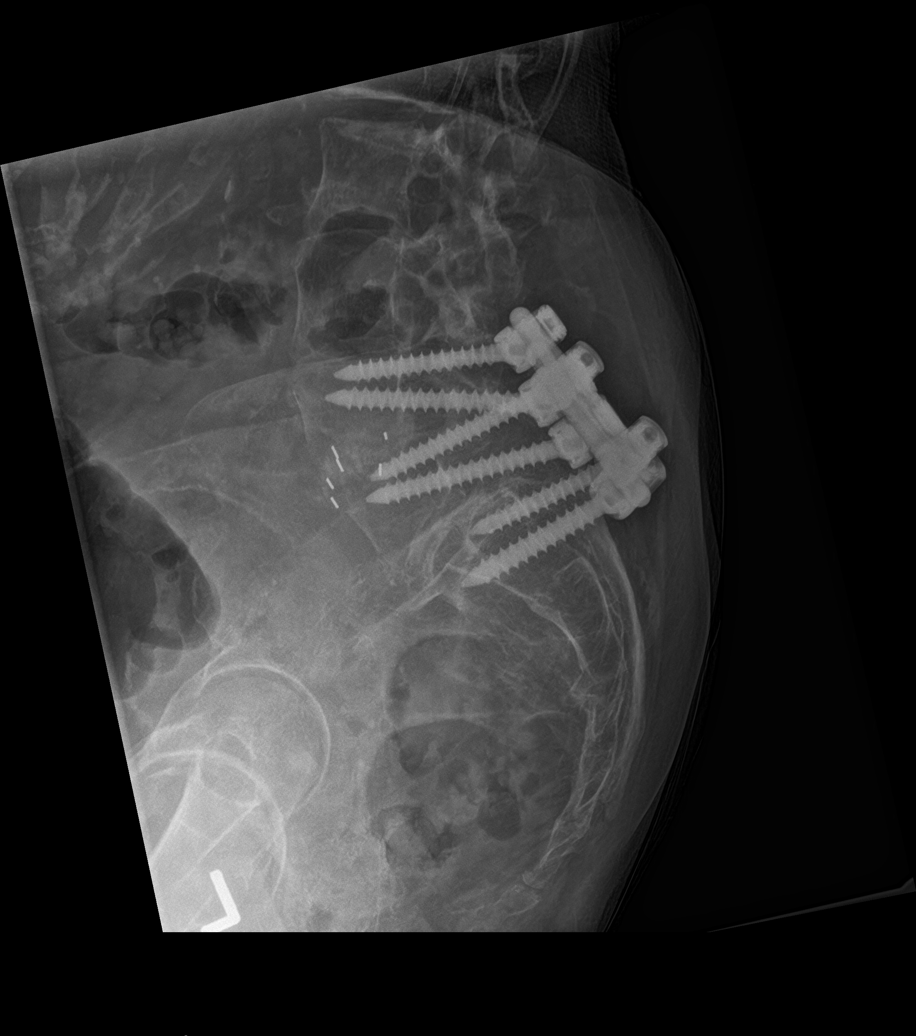

[5 of 5 positions shown; findings below may reference images not displayed]

FINDINGS: L4-S1 lumbar fusion with instrumentation has been performed. There
is stable grade 2 anterolisthesis of L5 upon S1. There is moderate
thoracolumbar dextroscoliosis, apex right at L1, stable since prior
examination. There is interval development of an age indeterminate
T11 compression fracture with approximately 50% loss of height.
There is mild paraspinal soft tissue swelling at this level,
possibly representing paraspinal hemorrhage or edema in the setting
of an acute fracture. No retropulsion. Remaining vertebral body
heights are preserved. The paraspinal soft tissues are otherwise
unremarkable.
IMPRESSION: Age-indeterminate T11 compression fracture. Mild paraspinal stripe
widening may reflect edema or hemorrhage in this location.
Correlation for point tenderness is recommended. If present, this
could be better aged with MRI examination.

## 2022-09-19 DIAGNOSIS — M159 Polyosteoarthritis, unspecified: Secondary | ICD-10-CM | POA: Diagnosis not present

## 2022-09-19 DIAGNOSIS — M419 Scoliosis, unspecified: Secondary | ICD-10-CM | POA: Diagnosis not present

## 2022-09-19 DIAGNOSIS — M4802 Spinal stenosis, cervical region: Secondary | ICD-10-CM | POA: Diagnosis not present

## 2022-09-19 DIAGNOSIS — I10 Essential (primary) hypertension: Secondary | ICD-10-CM | POA: Diagnosis not present

## 2022-09-19 DIAGNOSIS — M810 Age-related osteoporosis without current pathological fracture: Secondary | ICD-10-CM | POA: Diagnosis not present

## 2022-10-05 DIAGNOSIS — H35363 Drusen (degenerative) of macula, bilateral: Secondary | ICD-10-CM | POA: Diagnosis not present

## 2022-10-05 DIAGNOSIS — H43813 Vitreous degeneration, bilateral: Secondary | ICD-10-CM | POA: Diagnosis not present

## 2022-10-05 DIAGNOSIS — H3554 Dystrophies primarily involving the retinal pigment epithelium: Secondary | ICD-10-CM | POA: Diagnosis not present

## 2022-10-05 DIAGNOSIS — H353132 Nonexudative age-related macular degeneration, bilateral, intermediate dry stage: Secondary | ICD-10-CM | POA: Diagnosis not present

## 2022-10-05 DIAGNOSIS — H5203 Hypermetropia, bilateral: Secondary | ICD-10-CM | POA: Diagnosis not present

## 2022-10-05 DIAGNOSIS — H02834 Dermatochalasis of left upper eyelid: Secondary | ICD-10-CM | POA: Diagnosis not present

## 2022-10-05 DIAGNOSIS — H02831 Dermatochalasis of right upper eyelid: Secondary | ICD-10-CM | POA: Diagnosis not present

## 2022-10-05 DIAGNOSIS — Z961 Presence of intraocular lens: Secondary | ICD-10-CM | POA: Diagnosis not present

## 2022-10-05 DIAGNOSIS — H524 Presbyopia: Secondary | ICD-10-CM | POA: Diagnosis not present

## 2022-10-05 DIAGNOSIS — H401134 Primary open-angle glaucoma, bilateral, indeterminate stage: Secondary | ICD-10-CM | POA: Diagnosis not present

## 2022-10-05 DIAGNOSIS — H52203 Unspecified astigmatism, bilateral: Secondary | ICD-10-CM | POA: Diagnosis not present

## 2022-10-19 DIAGNOSIS — R17 Unspecified jaundice: Secondary | ICD-10-CM | POA: Diagnosis not present

## 2022-10-20 DIAGNOSIS — L819 Disorder of pigmentation, unspecified: Secondary | ICD-10-CM | POA: Diagnosis not present

## 2022-10-20 DIAGNOSIS — I10 Essential (primary) hypertension: Secondary | ICD-10-CM | POA: Diagnosis not present

## 2022-10-20 DIAGNOSIS — E871 Hypo-osmolality and hyponatremia: Secondary | ICD-10-CM | POA: Diagnosis not present

## 2022-10-21 DIAGNOSIS — Z923 Personal history of irradiation: Secondary | ICD-10-CM | POA: Diagnosis not present

## 2022-10-21 DIAGNOSIS — R17 Unspecified jaundice: Secondary | ICD-10-CM | POA: Diagnosis not present

## 2022-10-21 DIAGNOSIS — K838 Other specified diseases of biliary tract: Secondary | ICD-10-CM | POA: Diagnosis not present

## 2022-10-21 DIAGNOSIS — R7989 Other specified abnormal findings of blood chemistry: Secondary | ICD-10-CM | POA: Diagnosis not present

## 2022-10-21 DIAGNOSIS — C250 Malignant neoplasm of head of pancreas: Secondary | ICD-10-CM | POA: Diagnosis not present

## 2022-10-21 DIAGNOSIS — K828 Other specified diseases of gallbladder: Secondary | ICD-10-CM | POA: Diagnosis not present

## 2022-10-21 DIAGNOSIS — G8929 Other chronic pain: Secondary | ICD-10-CM | POA: Diagnosis not present

## 2022-10-21 DIAGNOSIS — K869 Disease of pancreas, unspecified: Secondary | ICD-10-CM | POA: Diagnosis not present

## 2022-10-21 DIAGNOSIS — Z66 Do not resuscitate: Secondary | ICD-10-CM | POA: Diagnosis not present

## 2022-10-21 DIAGNOSIS — I440 Atrioventricular block, first degree: Secondary | ICD-10-CM | POA: Diagnosis not present

## 2022-10-21 DIAGNOSIS — I10 Essential (primary) hypertension: Secondary | ICD-10-CM | POA: Diagnosis not present

## 2022-10-21 DIAGNOSIS — R636 Underweight: Secondary | ICD-10-CM | POA: Diagnosis not present

## 2022-10-21 DIAGNOSIS — R978 Other abnormal tumor markers: Secondary | ICD-10-CM | POA: Diagnosis not present

## 2022-10-21 DIAGNOSIS — M549 Dorsalgia, unspecified: Secondary | ICD-10-CM | POA: Diagnosis not present

## 2022-10-21 DIAGNOSIS — E876 Hypokalemia: Secondary | ICD-10-CM | POA: Diagnosis not present

## 2022-10-21 DIAGNOSIS — K5730 Diverticulosis of large intestine without perforation or abscess without bleeding: Secondary | ICD-10-CM | POA: Diagnosis not present

## 2022-10-21 DIAGNOSIS — R9389 Abnormal findings on diagnostic imaging of other specified body structures: Secondary | ICD-10-CM | POA: Diagnosis not present

## 2022-10-21 DIAGNOSIS — K8020 Calculus of gallbladder without cholecystitis without obstruction: Secondary | ICD-10-CM | POA: Diagnosis not present

## 2022-10-21 DIAGNOSIS — R6889 Other general symptoms and signs: Secondary | ICD-10-CM | POA: Diagnosis not present

## 2022-10-21 DIAGNOSIS — K831 Obstruction of bile duct: Secondary | ICD-10-CM | POA: Diagnosis not present

## 2022-10-21 DIAGNOSIS — K8021 Calculus of gallbladder without cholecystitis with obstruction: Secondary | ICD-10-CM | POA: Diagnosis not present

## 2022-10-21 DIAGNOSIS — K8689 Other specified diseases of pancreas: Secondary | ICD-10-CM | POA: Diagnosis not present

## 2022-10-21 DIAGNOSIS — H353130 Nonexudative age-related macular degeneration, bilateral, stage unspecified: Secondary | ICD-10-CM | POA: Diagnosis not present

## 2022-10-21 DIAGNOSIS — C801 Malignant (primary) neoplasm, unspecified: Secondary | ICD-10-CM | POA: Diagnosis not present

## 2022-10-21 DIAGNOSIS — Z853 Personal history of malignant neoplasm of breast: Secondary | ICD-10-CM | POA: Diagnosis not present

## 2022-10-21 DIAGNOSIS — C259 Malignant neoplasm of pancreas, unspecified: Secondary | ICD-10-CM | POA: Diagnosis not present

## 2022-10-21 DIAGNOSIS — Z743 Need for continuous supervision: Secondary | ICD-10-CM | POA: Diagnosis not present

## 2022-10-21 DIAGNOSIS — K8080 Other cholelithiasis without obstruction: Secondary | ICD-10-CM | POA: Diagnosis not present

## 2022-10-21 DIAGNOSIS — Z681 Body mass index (BMI) 19 or less, adult: Secondary | ICD-10-CM | POA: Diagnosis not present

## 2022-10-21 DIAGNOSIS — J9811 Atelectasis: Secondary | ICD-10-CM | POA: Diagnosis not present

## 2022-10-21 DIAGNOSIS — G4489 Other headache syndrome: Secondary | ICD-10-CM | POA: Diagnosis not present

## 2022-10-21 DIAGNOSIS — Z79899 Other long term (current) drug therapy: Secondary | ICD-10-CM | POA: Diagnosis not present

## 2022-10-22 DIAGNOSIS — C801 Malignant (primary) neoplasm, unspecified: Secondary | ICD-10-CM | POA: Diagnosis not present

## 2022-10-22 DIAGNOSIS — I440 Atrioventricular block, first degree: Secondary | ICD-10-CM | POA: Diagnosis not present

## 2022-10-22 DIAGNOSIS — K8689 Other specified diseases of pancreas: Secondary | ICD-10-CM | POA: Diagnosis not present

## 2022-10-22 DIAGNOSIS — K831 Obstruction of bile duct: Secondary | ICD-10-CM | POA: Diagnosis not present

## 2022-10-23 DIAGNOSIS — K831 Obstruction of bile duct: Secondary | ICD-10-CM | POA: Diagnosis not present

## 2022-10-23 DIAGNOSIS — K8689 Other specified diseases of pancreas: Secondary | ICD-10-CM | POA: Diagnosis not present

## 2022-10-24 DIAGNOSIS — K831 Obstruction of bile duct: Secondary | ICD-10-CM | POA: Diagnosis not present

## 2022-10-25 DIAGNOSIS — K8689 Other specified diseases of pancreas: Secondary | ICD-10-CM | POA: Diagnosis not present

## 2022-10-25 DIAGNOSIS — K831 Obstruction of bile duct: Secondary | ICD-10-CM | POA: Diagnosis not present

## 2022-10-25 DIAGNOSIS — K838 Other specified diseases of biliary tract: Secondary | ICD-10-CM | POA: Diagnosis not present

## 2022-10-26 DIAGNOSIS — K831 Obstruction of bile duct: Secondary | ICD-10-CM | POA: Diagnosis not present

## 2022-10-26 DIAGNOSIS — K869 Disease of pancreas, unspecified: Secondary | ICD-10-CM | POA: Diagnosis not present

## 2022-10-27 DIAGNOSIS — C259 Malignant neoplasm of pancreas, unspecified: Secondary | ICD-10-CM | POA: Diagnosis not present

## 2022-10-27 DIAGNOSIS — K831 Obstruction of bile duct: Secondary | ICD-10-CM | POA: Diagnosis not present

## 2022-10-27 DIAGNOSIS — J9811 Atelectasis: Secondary | ICD-10-CM | POA: Diagnosis not present

## 2022-10-27 DIAGNOSIS — R978 Other abnormal tumor markers: Secondary | ICD-10-CM | POA: Diagnosis not present

## 2022-10-29 DIAGNOSIS — N390 Urinary tract infection, site not specified: Secondary | ICD-10-CM | POA: Diagnosis not present

## 2022-10-31 DIAGNOSIS — I10 Essential (primary) hypertension: Secondary | ICD-10-CM | POA: Diagnosis not present

## 2022-10-31 DIAGNOSIS — J309 Allergic rhinitis, unspecified: Secondary | ICD-10-CM | POA: Diagnosis not present

## 2022-10-31 DIAGNOSIS — M4802 Spinal stenosis, cervical region: Secondary | ICD-10-CM | POA: Diagnosis not present

## 2022-10-31 DIAGNOSIS — K831 Obstruction of bile duct: Secondary | ICD-10-CM | POA: Diagnosis not present

## 2022-10-31 DIAGNOSIS — M419 Scoliosis, unspecified: Secondary | ICD-10-CM | POA: Diagnosis not present

## 2022-10-31 DIAGNOSIS — K710 Toxic liver disease with cholestasis: Secondary | ICD-10-CM | POA: Diagnosis not present

## 2022-10-31 DIAGNOSIS — M810 Age-related osteoporosis without current pathological fracture: Secondary | ICD-10-CM | POA: Diagnosis not present

## 2022-10-31 DIAGNOSIS — K8689 Other specified diseases of pancreas: Secondary | ICD-10-CM | POA: Diagnosis not present

## 2022-10-31 DIAGNOSIS — E871 Hypo-osmolality and hyponatremia: Secondary | ICD-10-CM | POA: Diagnosis not present

## 2022-10-31 DIAGNOSIS — C251 Malignant neoplasm of body of pancreas: Secondary | ICD-10-CM | POA: Diagnosis not present

## 2022-10-31 DIAGNOSIS — H2620 Unspecified complicated cataract: Secondary | ICD-10-CM | POA: Diagnosis not present

## 2022-11-01 DIAGNOSIS — E440 Moderate protein-calorie malnutrition: Secondary | ICD-10-CM | POA: Diagnosis not present

## 2022-11-01 DIAGNOSIS — R17 Unspecified jaundice: Secondary | ICD-10-CM | POA: Diagnosis not present

## 2022-11-09 DIAGNOSIS — K710 Toxic liver disease with cholestasis: Secondary | ICD-10-CM | POA: Diagnosis not present

## 2022-11-09 DIAGNOSIS — D649 Anemia, unspecified: Secondary | ICD-10-CM | POA: Diagnosis not present

## 2022-11-09 DIAGNOSIS — M810 Age-related osteoporosis without current pathological fracture: Secondary | ICD-10-CM | POA: Diagnosis not present

## 2022-11-09 DIAGNOSIS — I10 Essential (primary) hypertension: Secondary | ICD-10-CM | POA: Diagnosis not present

## 2022-11-09 DIAGNOSIS — H2620 Unspecified complicated cataract: Secondary | ICD-10-CM | POA: Diagnosis not present

## 2022-11-14 DIAGNOSIS — Z923 Personal history of irradiation: Secondary | ICD-10-CM | POA: Diagnosis not present

## 2022-11-14 DIAGNOSIS — C251 Malignant neoplasm of body of pancreas: Secondary | ICD-10-CM | POA: Diagnosis not present

## 2022-11-14 DIAGNOSIS — Z853 Personal history of malignant neoplasm of breast: Secondary | ICD-10-CM | POA: Diagnosis not present

## 2022-11-15 DIAGNOSIS — C250 Malignant neoplasm of head of pancreas: Secondary | ICD-10-CM | POA: Diagnosis not present

## 2022-11-17 DIAGNOSIS — K710 Toxic liver disease with cholestasis: Secondary | ICD-10-CM | POA: Diagnosis not present

## 2022-11-17 DIAGNOSIS — H2620 Unspecified complicated cataract: Secondary | ICD-10-CM | POA: Diagnosis not present

## 2022-11-17 DIAGNOSIS — M810 Age-related osteoporosis without current pathological fracture: Secondary | ICD-10-CM | POA: Diagnosis not present

## 2022-11-17 DIAGNOSIS — I10 Essential (primary) hypertension: Secondary | ICD-10-CM | POA: Diagnosis not present

## 2022-11-23 DIAGNOSIS — H2620 Unspecified complicated cataract: Secondary | ICD-10-CM | POA: Diagnosis not present

## 2022-11-23 DIAGNOSIS — I10 Essential (primary) hypertension: Secondary | ICD-10-CM | POA: Diagnosis not present

## 2022-11-23 DIAGNOSIS — K710 Toxic liver disease with cholestasis: Secondary | ICD-10-CM | POA: Diagnosis not present

## 2022-11-23 DIAGNOSIS — M810 Age-related osteoporosis without current pathological fracture: Secondary | ICD-10-CM | POA: Diagnosis not present

## 2022-11-28 DIAGNOSIS — C251 Malignant neoplasm of body of pancreas: Secondary | ICD-10-CM | POA: Diagnosis not present

## 2022-12-01 DIAGNOSIS — I10 Essential (primary) hypertension: Secondary | ICD-10-CM | POA: Diagnosis not present

## 2022-12-01 DIAGNOSIS — K710 Toxic liver disease with cholestasis: Secondary | ICD-10-CM | POA: Diagnosis not present

## 2022-12-01 DIAGNOSIS — H2620 Unspecified complicated cataract: Secondary | ICD-10-CM | POA: Diagnosis not present

## 2022-12-01 DIAGNOSIS — M810 Age-related osteoporosis without current pathological fracture: Secondary | ICD-10-CM | POA: Diagnosis not present

## 2022-12-05 DIAGNOSIS — K56609 Unspecified intestinal obstruction, unspecified as to partial versus complete obstruction: Secondary | ICD-10-CM | POA: Diagnosis not present

## 2022-12-05 DIAGNOSIS — R42 Dizziness and giddiness: Secondary | ICD-10-CM | POA: Diagnosis not present

## 2022-12-05 DIAGNOSIS — J309 Allergic rhinitis, unspecified: Secondary | ICD-10-CM | POA: Diagnosis not present

## 2022-12-05 DIAGNOSIS — K8689 Other specified diseases of pancreas: Secondary | ICD-10-CM | POA: Diagnosis not present

## 2022-12-05 DIAGNOSIS — M159 Polyosteoarthritis, unspecified: Secondary | ICD-10-CM | POA: Diagnosis not present

## 2022-12-08 DIAGNOSIS — H2620 Unspecified complicated cataract: Secondary | ICD-10-CM | POA: Diagnosis not present

## 2022-12-08 DIAGNOSIS — M810 Age-related osteoporosis without current pathological fracture: Secondary | ICD-10-CM | POA: Diagnosis not present

## 2022-12-08 DIAGNOSIS — I10 Essential (primary) hypertension: Secondary | ICD-10-CM | POA: Diagnosis not present

## 2022-12-08 DIAGNOSIS — K710 Toxic liver disease with cholestasis: Secondary | ICD-10-CM | POA: Diagnosis not present

## 2022-12-14 DIAGNOSIS — K710 Toxic liver disease with cholestasis: Secondary | ICD-10-CM | POA: Diagnosis not present

## 2022-12-14 DIAGNOSIS — H2620 Unspecified complicated cataract: Secondary | ICD-10-CM | POA: Diagnosis not present

## 2022-12-14 DIAGNOSIS — M810 Age-related osteoporosis without current pathological fracture: Secondary | ICD-10-CM | POA: Diagnosis not present

## 2022-12-14 DIAGNOSIS — I10 Essential (primary) hypertension: Secondary | ICD-10-CM | POA: Diagnosis not present

## 2022-12-20 DIAGNOSIS — I10 Essential (primary) hypertension: Secondary | ICD-10-CM | POA: Diagnosis not present

## 2022-12-20 DIAGNOSIS — K710 Toxic liver disease with cholestasis: Secondary | ICD-10-CM | POA: Diagnosis not present

## 2022-12-20 DIAGNOSIS — H2620 Unspecified complicated cataract: Secondary | ICD-10-CM | POA: Diagnosis not present

## 2022-12-20 DIAGNOSIS — M810 Age-related osteoporosis without current pathological fracture: Secondary | ICD-10-CM | POA: Diagnosis not present

## 2022-12-21 DIAGNOSIS — K831 Obstruction of bile duct: Secondary | ICD-10-CM | POA: Diagnosis not present

## 2022-12-21 DIAGNOSIS — Z923 Personal history of irradiation: Secondary | ICD-10-CM | POA: Diagnosis not present

## 2022-12-21 DIAGNOSIS — I10 Essential (primary) hypertension: Secondary | ICD-10-CM | POA: Diagnosis not present

## 2022-12-21 DIAGNOSIS — Z9689 Presence of other specified functional implants: Secondary | ICD-10-CM | POA: Diagnosis not present

## 2022-12-21 DIAGNOSIS — C250 Malignant neoplasm of head of pancreas: Secondary | ICD-10-CM | POA: Diagnosis not present

## 2022-12-21 DIAGNOSIS — K838 Other specified diseases of biliary tract: Secondary | ICD-10-CM | POA: Diagnosis not present

## 2022-12-21 DIAGNOSIS — Z4659 Encounter for fitting and adjustment of other gastrointestinal appliance and device: Secondary | ICD-10-CM | POA: Diagnosis not present

## 2022-12-21 DIAGNOSIS — Z853 Personal history of malignant neoplasm of breast: Secondary | ICD-10-CM | POA: Diagnosis not present

## 2022-12-21 DIAGNOSIS — K8689 Other specified diseases of pancreas: Secondary | ICD-10-CM | POA: Diagnosis not present

## 2022-12-21 DIAGNOSIS — E871 Hypo-osmolality and hyponatremia: Secondary | ICD-10-CM | POA: Diagnosis not present

## 2022-12-21 DIAGNOSIS — Z800 Family history of malignant neoplasm of digestive organs: Secondary | ICD-10-CM | POA: Diagnosis not present

## 2022-12-26 DIAGNOSIS — E871 Hypo-osmolality and hyponatremia: Secondary | ICD-10-CM | POA: Diagnosis not present

## 2022-12-26 DIAGNOSIS — M4802 Spinal stenosis, cervical region: Secondary | ICD-10-CM | POA: Diagnosis not present

## 2022-12-26 DIAGNOSIS — H938X1 Other specified disorders of right ear: Secondary | ICD-10-CM | POA: Diagnosis not present

## 2022-12-26 DIAGNOSIS — Z9889 Other specified postprocedural states: Secondary | ICD-10-CM | POA: Diagnosis not present

## 2022-12-26 DIAGNOSIS — M159 Polyosteoarthritis, unspecified: Secondary | ICD-10-CM | POA: Diagnosis not present

## 2022-12-26 DIAGNOSIS — L989 Disorder of the skin and subcutaneous tissue, unspecified: Secondary | ICD-10-CM | POA: Diagnosis not present

## 2022-12-26 DIAGNOSIS — M6281 Muscle weakness (generalized): Secondary | ICD-10-CM | POA: Diagnosis not present

## 2022-12-29 DIAGNOSIS — E871 Hypo-osmolality and hyponatremia: Secondary | ICD-10-CM | POA: Diagnosis not present

## 2022-12-29 DIAGNOSIS — H2620 Unspecified complicated cataract: Secondary | ICD-10-CM | POA: Diagnosis not present

## 2022-12-29 DIAGNOSIS — M810 Age-related osteoporosis without current pathological fracture: Secondary | ICD-10-CM | POA: Diagnosis not present

## 2022-12-29 DIAGNOSIS — K710 Toxic liver disease with cholestasis: Secondary | ICD-10-CM | POA: Diagnosis not present

## 2022-12-29 DIAGNOSIS — I10 Essential (primary) hypertension: Secondary | ICD-10-CM | POA: Diagnosis not present

## 2023-01-03 DIAGNOSIS — E871 Hypo-osmolality and hyponatremia: Secondary | ICD-10-CM | POA: Diagnosis not present

## 2023-01-04 DIAGNOSIS — C259 Malignant neoplasm of pancreas, unspecified: Secondary | ICD-10-CM | POA: Diagnosis not present

## 2023-01-04 DIAGNOSIS — E871 Hypo-osmolality and hyponatremia: Secondary | ICD-10-CM | POA: Diagnosis not present

## 2023-01-04 DIAGNOSIS — G8929 Other chronic pain: Secondary | ICD-10-CM | POA: Diagnosis not present

## 2023-01-05 DIAGNOSIS — H2620 Unspecified complicated cataract: Secondary | ICD-10-CM | POA: Diagnosis not present

## 2023-01-05 DIAGNOSIS — M810 Age-related osteoporosis without current pathological fracture: Secondary | ICD-10-CM | POA: Diagnosis not present

## 2023-01-05 DIAGNOSIS — I10 Essential (primary) hypertension: Secondary | ICD-10-CM | POA: Diagnosis not present

## 2023-01-05 DIAGNOSIS — K710 Toxic liver disease with cholestasis: Secondary | ICD-10-CM | POA: Diagnosis not present

## 2023-01-11 DIAGNOSIS — M4802 Spinal stenosis, cervical region: Secondary | ICD-10-CM | POA: Diagnosis not present

## 2023-01-11 DIAGNOSIS — M419 Scoliosis, unspecified: Secondary | ICD-10-CM | POA: Diagnosis not present

## 2023-01-11 DIAGNOSIS — C259 Malignant neoplasm of pancreas, unspecified: Secondary | ICD-10-CM | POA: Diagnosis not present

## 2023-01-12 DIAGNOSIS — S22080S Wedge compression fracture of T11-T12 vertebra, sequela: Secondary | ICD-10-CM | POA: Diagnosis not present

## 2023-01-12 DIAGNOSIS — S0990XA Unspecified injury of head, initial encounter: Secondary | ICD-10-CM | POA: Diagnosis not present

## 2023-01-12 DIAGNOSIS — M25511 Pain in right shoulder: Secondary | ICD-10-CM | POA: Diagnosis not present

## 2023-01-12 DIAGNOSIS — Z981 Arthrodesis status: Secondary | ICD-10-CM | POA: Diagnosis not present

## 2023-01-12 DIAGNOSIS — W19XXXA Unspecified fall, initial encounter: Secondary | ICD-10-CM | POA: Diagnosis not present

## 2023-01-12 DIAGNOSIS — I672 Cerebral atherosclerosis: Secondary | ICD-10-CM | POA: Diagnosis not present

## 2023-01-12 DIAGNOSIS — Z743 Need for continuous supervision: Secondary | ICD-10-CM | POA: Diagnosis not present

## 2023-01-12 DIAGNOSIS — S42031A Displaced fracture of lateral end of right clavicle, initial encounter for closed fracture: Secondary | ICD-10-CM | POA: Diagnosis not present

## 2023-01-12 DIAGNOSIS — I10 Essential (primary) hypertension: Secondary | ICD-10-CM | POA: Diagnosis not present

## 2023-01-12 DIAGNOSIS — K8020 Calculus of gallbladder without cholecystitis without obstruction: Secondary | ICD-10-CM | POA: Diagnosis not present

## 2023-01-12 DIAGNOSIS — R918 Other nonspecific abnormal finding of lung field: Secondary | ICD-10-CM | POA: Diagnosis not present

## 2023-01-12 DIAGNOSIS — M19011 Primary osteoarthritis, right shoulder: Secondary | ICD-10-CM | POA: Diagnosis not present

## 2023-01-12 DIAGNOSIS — R6889 Other general symptoms and signs: Secondary | ICD-10-CM | POA: Diagnosis not present

## 2023-01-12 DIAGNOSIS — E559 Vitamin D deficiency, unspecified: Secondary | ICD-10-CM | POA: Diagnosis not present

## 2023-01-12 DIAGNOSIS — S2241XA Multiple fractures of ribs, right side, initial encounter for closed fracture: Secondary | ICD-10-CM | POA: Diagnosis not present

## 2023-01-12 DIAGNOSIS — C259 Malignant neoplasm of pancreas, unspecified: Secondary | ICD-10-CM | POA: Diagnosis not present

## 2023-01-12 DIAGNOSIS — M1991 Primary osteoarthritis, unspecified site: Secondary | ICD-10-CM | POA: Diagnosis not present

## 2023-01-12 DIAGNOSIS — S2241XD Multiple fractures of ribs, right side, subsequent encounter for fracture with routine healing: Secondary | ICD-10-CM | POA: Diagnosis not present

## 2023-01-12 DIAGNOSIS — E860 Dehydration: Secondary | ICD-10-CM | POA: Diagnosis not present

## 2023-01-12 DIAGNOSIS — K8689 Other specified diseases of pancreas: Secondary | ICD-10-CM | POA: Diagnosis not present

## 2023-01-12 DIAGNOSIS — M4317 Spondylolisthesis, lumbosacral region: Secondary | ICD-10-CM | POA: Diagnosis not present

## 2023-01-12 DIAGNOSIS — M5137 Other intervertebral disc degeneration, lumbosacral region: Secondary | ICD-10-CM | POA: Diagnosis not present

## 2023-01-12 DIAGNOSIS — Z043 Encounter for examination and observation following other accident: Secondary | ICD-10-CM | POA: Diagnosis not present

## 2023-01-12 DIAGNOSIS — G8929 Other chronic pain: Secondary | ICD-10-CM | POA: Diagnosis not present

## 2023-01-12 DIAGNOSIS — E871 Hypo-osmolality and hyponatremia: Secondary | ICD-10-CM | POA: Diagnosis not present

## 2023-01-12 DIAGNOSIS — S270XXA Traumatic pneumothorax, initial encounter: Secondary | ICD-10-CM | POA: Diagnosis not present

## 2023-01-12 DIAGNOSIS — I700 Atherosclerosis of aorta: Secondary | ICD-10-CM | POA: Diagnosis not present

## 2023-01-12 DIAGNOSIS — S42001D Fracture of unspecified part of right clavicle, subsequent encounter for fracture with routine healing: Secondary | ICD-10-CM | POA: Diagnosis not present

## 2023-01-12 DIAGNOSIS — R296 Repeated falls: Secondary | ICD-10-CM | POA: Diagnosis not present

## 2023-01-13 DIAGNOSIS — I10 Essential (primary) hypertension: Secondary | ICD-10-CM | POA: Diagnosis not present

## 2023-01-13 DIAGNOSIS — S2241XA Multiple fractures of ribs, right side, initial encounter for closed fracture: Secondary | ICD-10-CM | POA: Diagnosis not present

## 2023-01-13 DIAGNOSIS — E871 Hypo-osmolality and hyponatremia: Secondary | ICD-10-CM | POA: Diagnosis not present

## 2023-01-14 DIAGNOSIS — S2241XA Multiple fractures of ribs, right side, initial encounter for closed fracture: Secondary | ICD-10-CM | POA: Diagnosis not present

## 2023-01-16 DIAGNOSIS — M4802 Spinal stenosis, cervical region: Secondary | ICD-10-CM | POA: Diagnosis not present

## 2023-01-16 DIAGNOSIS — E871 Hypo-osmolality and hyponatremia: Secondary | ICD-10-CM | POA: Diagnosis not present

## 2023-01-16 DIAGNOSIS — R6889 Other general symptoms and signs: Secondary | ICD-10-CM | POA: Diagnosis not present

## 2023-01-16 DIAGNOSIS — G8929 Other chronic pain: Secondary | ICD-10-CM | POA: Diagnosis not present

## 2023-01-16 DIAGNOSIS — S2241XD Multiple fractures of ribs, right side, subsequent encounter for fracture with routine healing: Secondary | ICD-10-CM | POA: Diagnosis not present

## 2023-01-16 DIAGNOSIS — S22080S Wedge compression fracture of T11-T12 vertebra, sequela: Secondary | ICD-10-CM | POA: Diagnosis not present

## 2023-01-16 DIAGNOSIS — J90 Pleural effusion, not elsewhere classified: Secondary | ICD-10-CM | POA: Diagnosis not present

## 2023-01-16 DIAGNOSIS — M1991 Primary osteoarthritis, unspecified site: Secondary | ICD-10-CM | POA: Diagnosis not present

## 2023-01-16 DIAGNOSIS — R5381 Other malaise: Secondary | ICD-10-CM | POA: Diagnosis not present

## 2023-01-16 DIAGNOSIS — K219 Gastro-esophageal reflux disease without esophagitis: Secondary | ICD-10-CM | POA: Diagnosis not present

## 2023-01-16 DIAGNOSIS — M6281 Muscle weakness (generalized): Secondary | ICD-10-CM | POA: Diagnosis not present

## 2023-01-16 DIAGNOSIS — I10 Essential (primary) hypertension: Secondary | ICD-10-CM | POA: Diagnosis not present

## 2023-01-16 DIAGNOSIS — J309 Allergic rhinitis, unspecified: Secondary | ICD-10-CM | POA: Diagnosis not present

## 2023-01-16 DIAGNOSIS — G44229 Chronic tension-type headache, not intractable: Secondary | ICD-10-CM | POA: Diagnosis not present

## 2023-01-16 DIAGNOSIS — M810 Age-related osteoporosis without current pathological fracture: Secondary | ICD-10-CM | POA: Diagnosis not present

## 2023-01-16 DIAGNOSIS — H3530 Unspecified macular degeneration: Secondary | ICD-10-CM | POA: Diagnosis not present

## 2023-01-16 DIAGNOSIS — S42001D Fracture of unspecified part of right clavicle, subsequent encounter for fracture with routine healing: Secondary | ICD-10-CM | POA: Diagnosis not present

## 2023-01-16 DIAGNOSIS — M419 Scoliosis, unspecified: Secondary | ICD-10-CM | POA: Diagnosis not present

## 2023-01-16 DIAGNOSIS — M159 Polyosteoarthritis, unspecified: Secondary | ICD-10-CM | POA: Diagnosis not present

## 2023-01-16 DIAGNOSIS — S42031A Displaced fracture of lateral end of right clavicle, initial encounter for closed fracture: Secondary | ICD-10-CM | POA: Diagnosis not present

## 2023-01-16 DIAGNOSIS — R296 Repeated falls: Secondary | ICD-10-CM | POA: Diagnosis not present

## 2023-01-16 DIAGNOSIS — Z743 Need for continuous supervision: Secondary | ICD-10-CM | POA: Diagnosis not present

## 2023-01-16 DIAGNOSIS — R0902 Hypoxemia: Secondary | ICD-10-CM | POA: Diagnosis not present

## 2023-01-16 DIAGNOSIS — S2241XA Multiple fractures of ribs, right side, initial encounter for closed fracture: Secondary | ICD-10-CM | POA: Diagnosis not present

## 2023-01-16 DIAGNOSIS — C259 Malignant neoplasm of pancreas, unspecified: Secondary | ICD-10-CM | POA: Diagnosis not present

## 2023-01-16 DIAGNOSIS — H409 Unspecified glaucoma: Secondary | ICD-10-CM | POA: Diagnosis not present

## 2023-01-16 DIAGNOSIS — K5900 Constipation, unspecified: Secondary | ICD-10-CM | POA: Diagnosis not present

## 2023-01-16 DIAGNOSIS — E559 Vitamin D deficiency, unspecified: Secondary | ICD-10-CM | POA: Diagnosis not present

## 2023-01-16 DIAGNOSIS — M898X1 Other specified disorders of bone, shoulder: Secondary | ICD-10-CM | POA: Diagnosis not present

## 2023-01-20 DIAGNOSIS — K219 Gastro-esophageal reflux disease without esophagitis: Secondary | ICD-10-CM | POA: Diagnosis not present

## 2023-01-20 DIAGNOSIS — M159 Polyosteoarthritis, unspecified: Secondary | ICD-10-CM | POA: Diagnosis not present

## 2023-01-20 DIAGNOSIS — K5900 Constipation, unspecified: Secondary | ICD-10-CM | POA: Diagnosis not present

## 2023-01-20 DIAGNOSIS — J309 Allergic rhinitis, unspecified: Secondary | ICD-10-CM | POA: Diagnosis not present

## 2023-01-20 DIAGNOSIS — M4802 Spinal stenosis, cervical region: Secondary | ICD-10-CM | POA: Diagnosis not present

## 2023-01-20 DIAGNOSIS — E871 Hypo-osmolality and hyponatremia: Secondary | ICD-10-CM | POA: Diagnosis not present

## 2023-01-20 DIAGNOSIS — R5381 Other malaise: Secondary | ICD-10-CM | POA: Diagnosis not present

## 2023-01-20 DIAGNOSIS — H3530 Unspecified macular degeneration: Secondary | ICD-10-CM | POA: Diagnosis not present

## 2023-01-20 DIAGNOSIS — S42001D Fracture of unspecified part of right clavicle, subsequent encounter for fracture with routine healing: Secondary | ICD-10-CM | POA: Diagnosis not present

## 2023-01-20 DIAGNOSIS — M419 Scoliosis, unspecified: Secondary | ICD-10-CM | POA: Diagnosis not present

## 2023-01-20 DIAGNOSIS — S2241XD Multiple fractures of ribs, right side, subsequent encounter for fracture with routine healing: Secondary | ICD-10-CM | POA: Diagnosis not present

## 2023-01-20 DIAGNOSIS — C259 Malignant neoplasm of pancreas, unspecified: Secondary | ICD-10-CM | POA: Diagnosis not present

## 2023-01-20 DIAGNOSIS — I10 Essential (primary) hypertension: Secondary | ICD-10-CM | POA: Diagnosis not present

## 2023-01-20 DIAGNOSIS — M810 Age-related osteoporosis without current pathological fracture: Secondary | ICD-10-CM | POA: Diagnosis not present

## 2023-01-20 DIAGNOSIS — H409 Unspecified glaucoma: Secondary | ICD-10-CM | POA: Diagnosis not present

## 2023-01-20 DIAGNOSIS — G44229 Chronic tension-type headache, not intractable: Secondary | ICD-10-CM | POA: Diagnosis not present

## 2023-01-25 DIAGNOSIS — S2241XD Multiple fractures of ribs, right side, subsequent encounter for fracture with routine healing: Secondary | ICD-10-CM | POA: Diagnosis not present

## 2023-01-25 DIAGNOSIS — M159 Polyosteoarthritis, unspecified: Secondary | ICD-10-CM | POA: Diagnosis not present

## 2023-01-25 DIAGNOSIS — E871 Hypo-osmolality and hyponatremia: Secondary | ICD-10-CM | POA: Diagnosis not present

## 2023-01-25 DIAGNOSIS — S42001D Fracture of unspecified part of right clavicle, subsequent encounter for fracture with routine healing: Secondary | ICD-10-CM | POA: Diagnosis not present

## 2023-01-30 DIAGNOSIS — R0902 Hypoxemia: Secondary | ICD-10-CM | POA: Diagnosis not present

## 2023-02-01 DIAGNOSIS — S2241XD Multiple fractures of ribs, right side, subsequent encounter for fracture with routine healing: Secondary | ICD-10-CM | POA: Diagnosis not present

## 2023-02-01 DIAGNOSIS — E871 Hypo-osmolality and hyponatremia: Secondary | ICD-10-CM | POA: Diagnosis not present

## 2023-02-01 DIAGNOSIS — J90 Pleural effusion, not elsewhere classified: Secondary | ICD-10-CM | POA: Diagnosis not present

## 2023-02-02 DIAGNOSIS — S42031A Displaced fracture of lateral end of right clavicle, initial encounter for closed fracture: Secondary | ICD-10-CM | POA: Diagnosis not present

## 2023-02-02 DIAGNOSIS — M898X1 Other specified disorders of bone, shoulder: Secondary | ICD-10-CM | POA: Diagnosis not present

## 2023-02-08 DIAGNOSIS — M810 Age-related osteoporosis without current pathological fracture: Secondary | ICD-10-CM | POA: Diagnosis not present

## 2023-02-08 DIAGNOSIS — J90 Pleural effusion, not elsewhere classified: Secondary | ICD-10-CM | POA: Diagnosis not present

## 2023-02-08 DIAGNOSIS — J9601 Acute respiratory failure with hypoxia: Secondary | ICD-10-CM | POA: Diagnosis not present

## 2023-02-08 DIAGNOSIS — E871 Hypo-osmolality and hyponatremia: Secondary | ICD-10-CM | POA: Diagnosis not present

## 2023-02-08 DIAGNOSIS — M419 Scoliosis, unspecified: Secondary | ICD-10-CM | POA: Diagnosis not present

## 2023-02-08 DIAGNOSIS — K710 Toxic liver disease with cholestasis: Secondary | ICD-10-CM | POA: Diagnosis not present

## 2023-02-08 DIAGNOSIS — M159 Polyosteoarthritis, unspecified: Secondary | ICD-10-CM | POA: Diagnosis not present

## 2023-02-08 DIAGNOSIS — H2620 Unspecified complicated cataract: Secondary | ICD-10-CM | POA: Diagnosis not present

## 2023-02-08 DIAGNOSIS — I10 Essential (primary) hypertension: Secondary | ICD-10-CM | POA: Diagnosis not present

## 2023-02-08 DIAGNOSIS — R600 Localized edema: Secondary | ICD-10-CM | POA: Diagnosis not present

## 2023-02-08 DIAGNOSIS — S2241XD Multiple fractures of ribs, right side, subsequent encounter for fracture with routine healing: Secondary | ICD-10-CM | POA: Diagnosis not present

## 2023-02-08 DIAGNOSIS — C259 Malignant neoplasm of pancreas, unspecified: Secondary | ICD-10-CM | POA: Diagnosis not present

## 2023-02-10 DIAGNOSIS — S22080S Wedge compression fracture of T11-T12 vertebra, sequela: Secondary | ICD-10-CM | POA: Diagnosis not present

## 2023-02-10 DIAGNOSIS — G8929 Other chronic pain: Secondary | ICD-10-CM | POA: Diagnosis not present

## 2023-02-10 DIAGNOSIS — R296 Repeated falls: Secondary | ICD-10-CM | POA: Diagnosis not present

## 2023-02-10 DIAGNOSIS — S2241XD Multiple fractures of ribs, right side, subsequent encounter for fracture with routine healing: Secondary | ICD-10-CM | POA: Diagnosis not present

## 2023-02-10 DIAGNOSIS — M6281 Muscle weakness (generalized): Secondary | ICD-10-CM | POA: Diagnosis not present

## 2023-02-16 DIAGNOSIS — K710 Toxic liver disease with cholestasis: Secondary | ICD-10-CM | POA: Diagnosis not present

## 2023-02-16 DIAGNOSIS — M810 Age-related osteoporosis without current pathological fracture: Secondary | ICD-10-CM | POA: Diagnosis not present

## 2023-02-16 DIAGNOSIS — H2620 Unspecified complicated cataract: Secondary | ICD-10-CM | POA: Diagnosis not present

## 2023-02-16 DIAGNOSIS — I10 Essential (primary) hypertension: Secondary | ICD-10-CM | POA: Diagnosis not present

## 2023-02-19 DIAGNOSIS — R0902 Hypoxemia: Secondary | ICD-10-CM | POA: Diagnosis not present

## 2023-02-19 DIAGNOSIS — R918 Other nonspecific abnormal finding of lung field: Secondary | ICD-10-CM | POA: Diagnosis not present

## 2023-02-21 DIAGNOSIS — K710 Toxic liver disease with cholestasis: Secondary | ICD-10-CM | POA: Diagnosis not present

## 2023-02-21 DIAGNOSIS — I10 Essential (primary) hypertension: Secondary | ICD-10-CM | POA: Diagnosis not present

## 2023-02-21 DIAGNOSIS — H2620 Unspecified complicated cataract: Secondary | ICD-10-CM | POA: Diagnosis not present

## 2023-02-21 DIAGNOSIS — M810 Age-related osteoporosis without current pathological fracture: Secondary | ICD-10-CM | POA: Diagnosis not present

## 2023-02-22 DIAGNOSIS — R5381 Other malaise: Secondary | ICD-10-CM | POA: Diagnosis not present

## 2023-02-22 DIAGNOSIS — E871 Hypo-osmolality and hyponatremia: Secondary | ICD-10-CM | POA: Diagnosis not present

## 2023-02-22 DIAGNOSIS — R600 Localized edema: Secondary | ICD-10-CM | POA: Diagnosis not present

## 2023-02-22 DIAGNOSIS — J90 Pleural effusion, not elsewhere classified: Secondary | ICD-10-CM | POA: Diagnosis not present

## 2023-02-23 DIAGNOSIS — H2620 Unspecified complicated cataract: Secondary | ICD-10-CM | POA: Diagnosis not present

## 2023-02-23 DIAGNOSIS — K710 Toxic liver disease with cholestasis: Secondary | ICD-10-CM | POA: Diagnosis not present

## 2023-02-23 DIAGNOSIS — M810 Age-related osteoporosis without current pathological fracture: Secondary | ICD-10-CM | POA: Diagnosis not present

## 2023-02-23 DIAGNOSIS — I10 Essential (primary) hypertension: Secondary | ICD-10-CM | POA: Diagnosis not present

## 2023-02-24 DIAGNOSIS — I10 Essential (primary) hypertension: Secondary | ICD-10-CM | POA: Diagnosis not present

## 2023-02-24 DIAGNOSIS — R0602 Shortness of breath: Secondary | ICD-10-CM | POA: Diagnosis not present

## 2023-02-24 DIAGNOSIS — E871 Hypo-osmolality and hyponatremia: Secondary | ICD-10-CM | POA: Diagnosis not present

## 2023-02-26 DIAGNOSIS — S271XXA Traumatic hemothorax, initial encounter: Secondary | ICD-10-CM | POA: Diagnosis not present

## 2023-02-26 DIAGNOSIS — S2241XA Multiple fractures of ribs, right side, initial encounter for closed fracture: Secondary | ICD-10-CM | POA: Diagnosis not present

## 2023-02-26 DIAGNOSIS — R0902 Hypoxemia: Secondary | ICD-10-CM | POA: Diagnosis not present

## 2023-02-26 DIAGNOSIS — J939 Pneumothorax, unspecified: Secondary | ICD-10-CM | POA: Diagnosis not present

## 2023-02-26 DIAGNOSIS — R918 Other nonspecific abnormal finding of lung field: Secondary | ICD-10-CM | POA: Diagnosis not present

## 2023-02-26 DIAGNOSIS — J9621 Acute and chronic respiratory failure with hypoxia: Secondary | ICD-10-CM | POA: Diagnosis not present

## 2023-02-26 DIAGNOSIS — J90 Pleural effusion, not elsewhere classified: Secondary | ICD-10-CM | POA: Diagnosis not present

## 2023-02-26 DIAGNOSIS — J9690 Respiratory failure, unspecified, unspecified whether with hypoxia or hypercapnia: Secondary | ICD-10-CM | POA: Diagnosis not present

## 2023-02-26 DIAGNOSIS — E871 Hypo-osmolality and hyponatremia: Secondary | ICD-10-CM | POA: Diagnosis not present

## 2023-02-26 DIAGNOSIS — R7989 Other specified abnormal findings of blood chemistry: Secondary | ICD-10-CM | POA: Diagnosis not present

## 2023-02-26 DIAGNOSIS — C250 Malignant neoplasm of head of pancreas: Secondary | ICD-10-CM | POA: Diagnosis not present

## 2023-02-26 DIAGNOSIS — S2249XA Multiple fractures of ribs, unspecified side, initial encounter for closed fracture: Secondary | ICD-10-CM | POA: Diagnosis not present

## 2023-02-26 DIAGNOSIS — Z515 Encounter for palliative care: Secondary | ICD-10-CM | POA: Diagnosis not present

## 2023-02-26 DIAGNOSIS — J690 Pneumonitis due to inhalation of food and vomit: Secondary | ICD-10-CM | POA: Diagnosis not present

## 2023-02-26 DIAGNOSIS — R0689 Other abnormalities of breathing: Secondary | ICD-10-CM | POA: Diagnosis not present

## 2023-02-26 DIAGNOSIS — S42001A Fracture of unspecified part of right clavicle, initial encounter for closed fracture: Secondary | ICD-10-CM | POA: Diagnosis not present

## 2023-02-26 DIAGNOSIS — R54 Age-related physical debility: Secondary | ICD-10-CM | POA: Diagnosis not present

## 2023-02-26 DIAGNOSIS — E43 Unspecified severe protein-calorie malnutrition: Secondary | ICD-10-CM | POA: Diagnosis not present

## 2023-02-26 DIAGNOSIS — Z4682 Encounter for fitting and adjustment of non-vascular catheter: Secondary | ICD-10-CM | POA: Diagnosis not present

## 2023-02-26 DIAGNOSIS — Z681 Body mass index (BMI) 19 or less, adult: Secondary | ICD-10-CM | POA: Diagnosis not present

## 2023-02-26 DIAGNOSIS — I10 Essential (primary) hypertension: Secondary | ICD-10-CM | POA: Diagnosis not present

## 2023-02-26 DIAGNOSIS — Z8507 Personal history of malignant neoplasm of pancreas: Secondary | ICD-10-CM | POA: Diagnosis not present

## 2023-02-26 DIAGNOSIS — I491 Atrial premature depolarization: Secondary | ICD-10-CM | POA: Diagnosis not present

## 2023-02-26 DIAGNOSIS — I4891 Unspecified atrial fibrillation: Secondary | ICD-10-CM | POA: Diagnosis not present

## 2023-02-26 DIAGNOSIS — Z743 Need for continuous supervision: Secondary | ICD-10-CM | POA: Diagnosis not present

## 2023-02-26 DIAGNOSIS — I700 Atherosclerosis of aorta: Secondary | ICD-10-CM | POA: Diagnosis not present

## 2023-02-26 DIAGNOSIS — J9601 Acute respiratory failure with hypoxia: Secondary | ICD-10-CM | POA: Diagnosis not present

## 2023-02-26 DIAGNOSIS — J942 Hemothorax: Secondary | ICD-10-CM | POA: Diagnosis not present

## 2023-02-26 DIAGNOSIS — Z853 Personal history of malignant neoplasm of breast: Secondary | ICD-10-CM | POA: Diagnosis not present

## 2023-02-26 DIAGNOSIS — I517 Cardiomegaly: Secondary | ICD-10-CM | POA: Diagnosis not present

## 2023-02-26 DIAGNOSIS — Z66 Do not resuscitate: Secondary | ICD-10-CM | POA: Diagnosis not present

## 2023-02-26 DIAGNOSIS — Z79899 Other long term (current) drug therapy: Secondary | ICD-10-CM | POA: Diagnosis not present

## 2023-03-14 DEATH — deceased
